# Patient Record
Sex: Female | Born: 1975 | Race: White | Hispanic: No | Marital: Married | Smoking: Never smoker
Health system: Southern US, Community
[De-identification: ages and names within clinical notes are randomized; demographics above are authoritative.]

## PROBLEM LIST (undated history)

## (undated) DIAGNOSIS — K760 Fatty (change of) liver, not elsewhere classified: Secondary | ICD-10-CM

## (undated) DIAGNOSIS — I10 Essential (primary) hypertension: Secondary | ICD-10-CM

## (undated) DIAGNOSIS — E119 Type 2 diabetes mellitus without complications: Secondary | ICD-10-CM

## (undated) DIAGNOSIS — Z9049 Acquired absence of other specified parts of digestive tract: Secondary | ICD-10-CM

## (undated) DIAGNOSIS — E282 Polycystic ovarian syndrome: Secondary | ICD-10-CM

## (undated) DIAGNOSIS — F319 Bipolar disorder, unspecified: Secondary | ICD-10-CM

## (undated) DIAGNOSIS — M069 Rheumatoid arthritis, unspecified: Secondary | ICD-10-CM

## (undated) DIAGNOSIS — E079 Disorder of thyroid, unspecified: Secondary | ICD-10-CM

## (undated) DIAGNOSIS — F419 Anxiety disorder, unspecified: Secondary | ICD-10-CM

## (undated) HISTORY — DX: Polycystic ovarian syndrome: E28.2

## (undated) HISTORY — PX: KNEE ARTHROPLASTY: SHX992

## (undated) HISTORY — DX: Rheumatoid arthritis, unspecified: M06.9

## (undated) HISTORY — DX: Essential (primary) hypertension: I10

## (undated) HISTORY — DX: Bipolar disorder, unspecified: F31.9

## (undated) HISTORY — DX: Anxiety disorder, unspecified: F41.9

## (undated) HISTORY — DX: Disorder of thyroid, unspecified: E07.9

## (undated) HISTORY — PX: KNEE ARTHROSCOPY: SUR90

## (undated) HISTORY — DX: Fatty (change of) liver, not elsewhere classified: K76.0

---

## 1997-01-24 HISTORY — PX: CHOLECYSTECTOMY: SHX55

## 2001-01-24 DIAGNOSIS — E039 Hypothyroidism, unspecified: Secondary | ICD-10-CM | POA: Insufficient documentation

## 2003-01-25 DIAGNOSIS — K219 Gastro-esophageal reflux disease without esophagitis: Secondary | ICD-10-CM | POA: Insufficient documentation

## 2003-01-25 DIAGNOSIS — F418 Other specified anxiety disorders: Secondary | ICD-10-CM | POA: Insufficient documentation

## 2003-01-25 DIAGNOSIS — E282 Polycystic ovarian syndrome: Secondary | ICD-10-CM | POA: Insufficient documentation

## 2003-02-03 ENCOUNTER — Ambulatory Visit (HOSPITAL_COMMUNITY): Admission: RE | Admit: 2003-02-03 | Discharge: 2003-02-03 | Payer: Self-pay | Admitting: Internal Medicine

## 2004-07-23 ENCOUNTER — Ambulatory Visit (HOSPITAL_COMMUNITY): Admission: RE | Admit: 2004-07-23 | Discharge: 2004-07-23 | Payer: Self-pay | Admitting: Internal Medicine

## 2004-10-12 ENCOUNTER — Encounter (HOSPITAL_COMMUNITY): Admission: RE | Admit: 2004-10-12 | Discharge: 2004-10-23 | Payer: Self-pay | Admitting: Internal Medicine

## 2004-10-26 ENCOUNTER — Encounter (HOSPITAL_COMMUNITY): Admission: RE | Admit: 2004-10-26 | Discharge: 2004-11-25 | Payer: Self-pay | Admitting: Internal Medicine

## 2005-12-02 ENCOUNTER — Ambulatory Visit (HOSPITAL_COMMUNITY): Admission: RE | Admit: 2005-12-02 | Discharge: 2005-12-02 | Payer: Self-pay | Admitting: Internal Medicine

## 2006-09-25 ENCOUNTER — Emergency Department (HOSPITAL_COMMUNITY): Admission: EM | Admit: 2006-09-25 | Discharge: 2006-09-25 | Payer: Self-pay | Admitting: Family Medicine

## 2006-10-24 ENCOUNTER — Ambulatory Visit (HOSPITAL_COMMUNITY): Admission: RE | Admit: 2006-10-24 | Discharge: 2006-10-24 | Payer: Self-pay | Admitting: Orthopedic Surgery

## 2006-11-23 ENCOUNTER — Encounter (INDEPENDENT_AMBULATORY_CARE_PROVIDER_SITE_OTHER): Payer: Self-pay | Admitting: Orthopedic Surgery

## 2006-11-23 ENCOUNTER — Ambulatory Visit (HOSPITAL_BASED_OUTPATIENT_CLINIC_OR_DEPARTMENT_OTHER): Admission: RE | Admit: 2006-11-23 | Discharge: 2006-11-23 | Payer: Self-pay | Admitting: Orthopedic Surgery

## 2007-03-30 ENCOUNTER — Ambulatory Visit (HOSPITAL_COMMUNITY): Admission: RE | Admit: 2007-03-30 | Discharge: 2007-03-30 | Payer: Self-pay | Admitting: Internal Medicine

## 2007-11-14 IMAGING — US US MISC SOFT TISSUE
2 series · 14 of 16 positions shown · non-contrast
Comparison: none

CLINICAL DATA: Volar mass.  Question ? cyst?
SOFT TISSUE ? RIGHT WRIST ULTRASOUND:
TECHNIQUE: Ultrasound examination of the soft tissues was performed in the area of clinical concern.

[Series 1: us misc soft tissue · 0.04mm/px · 11 of 14 slices shown (1 of 2)]
[im 1/14]
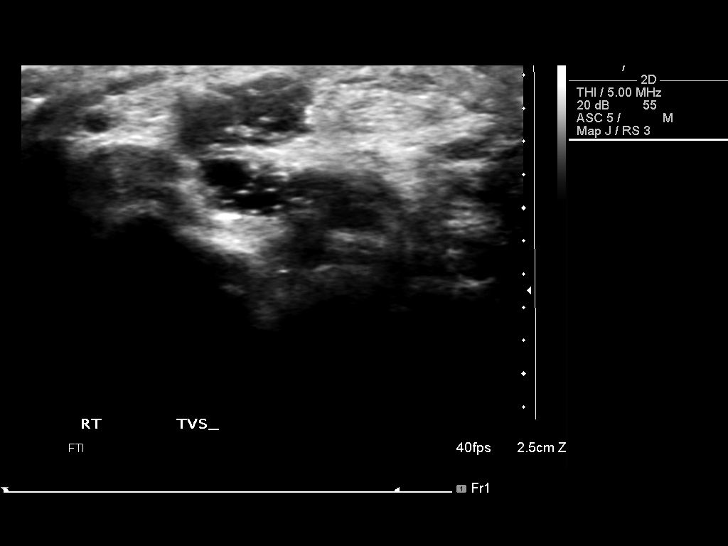
[im 2/14]
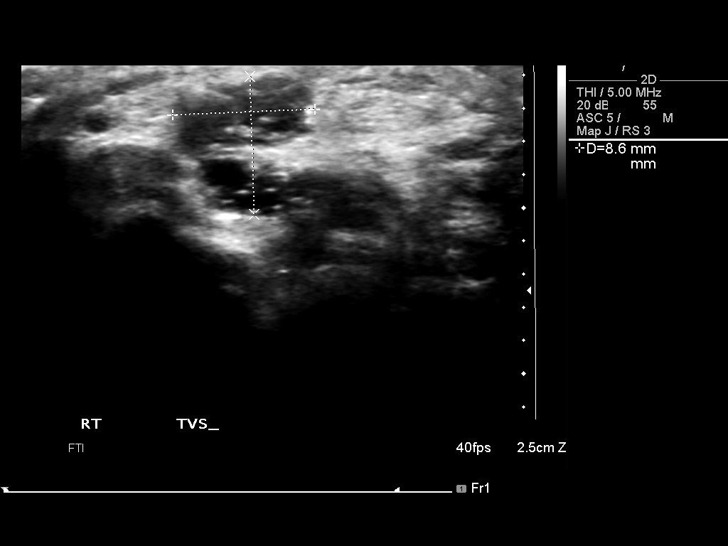
[im 3/14]
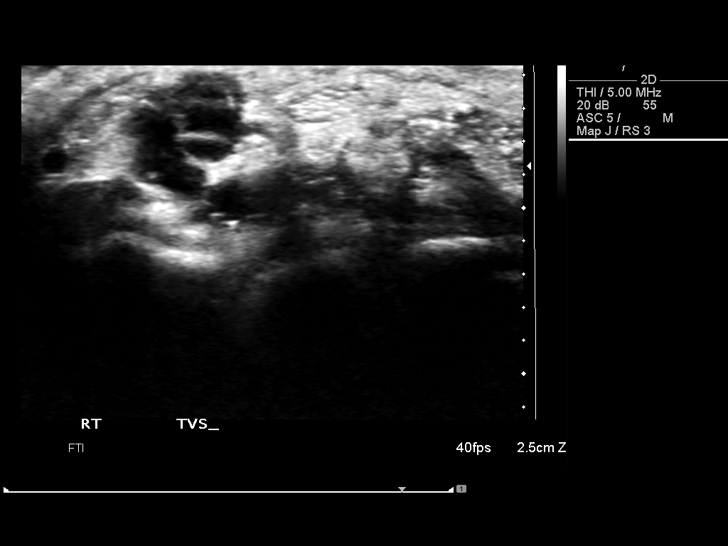
[im 5/14]
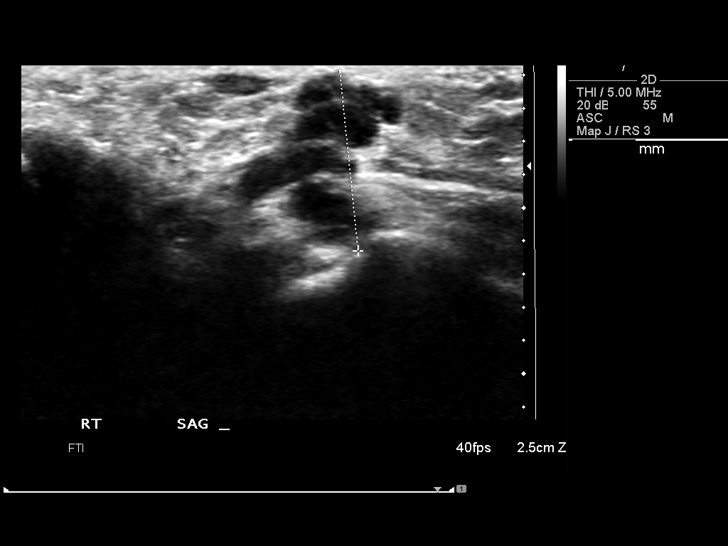
[im 6/14]
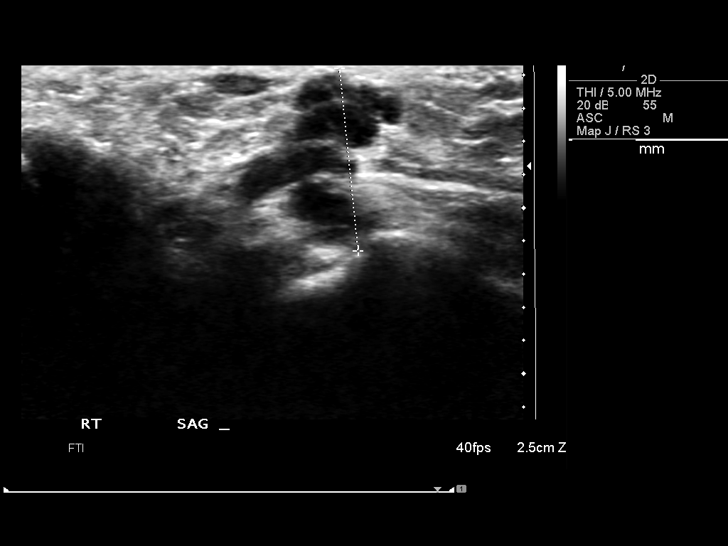
[im 7/14]
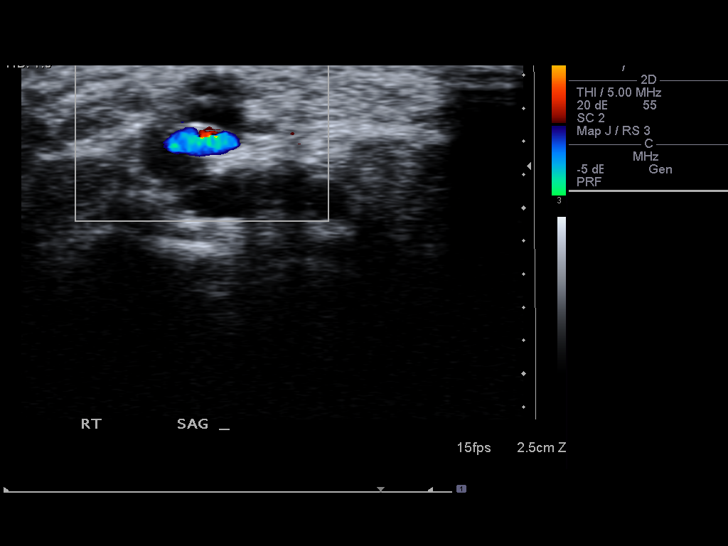
[im 8/14]
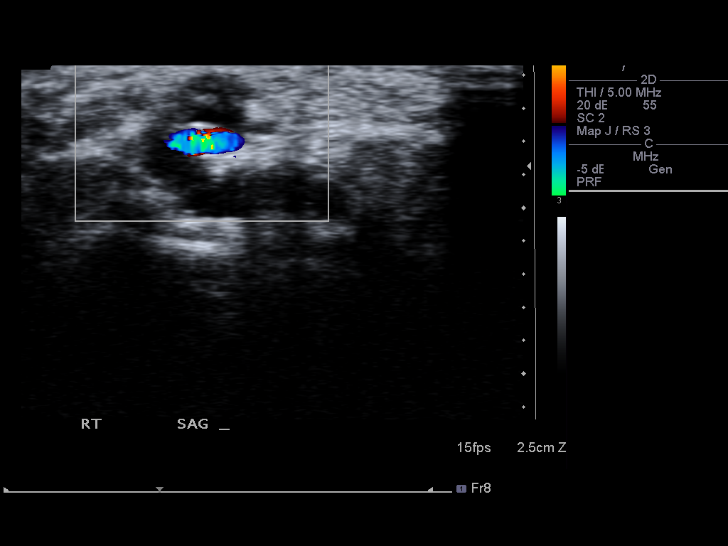
[im 9/14]
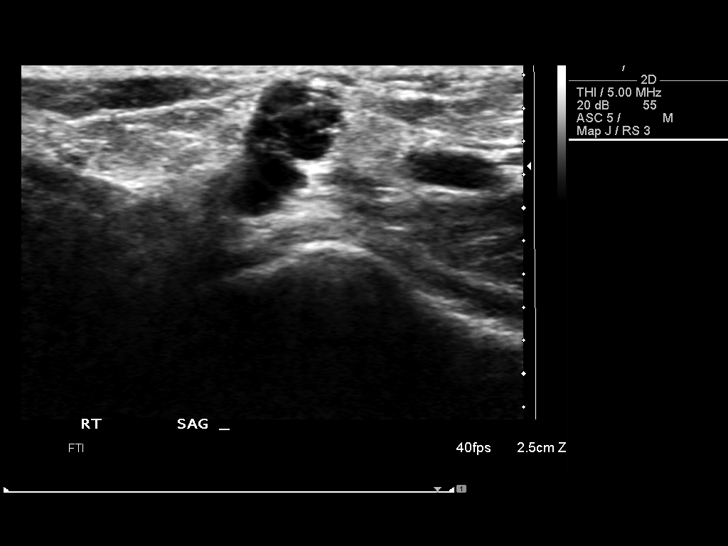
[im 10/14]
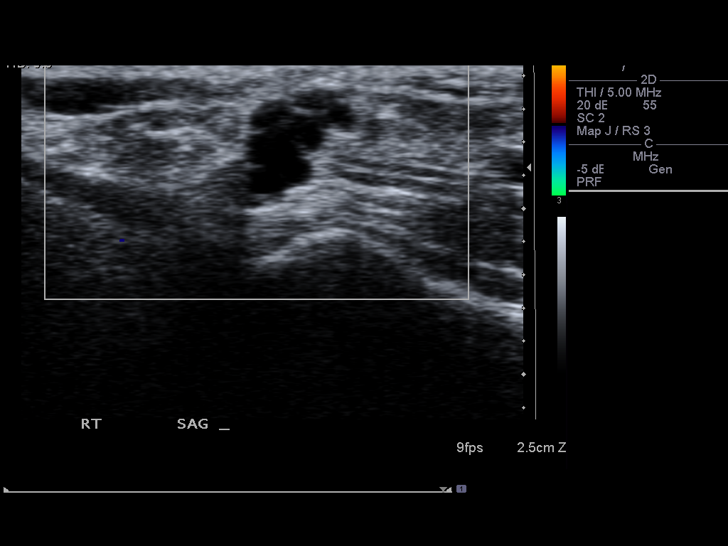
[im 11/14]
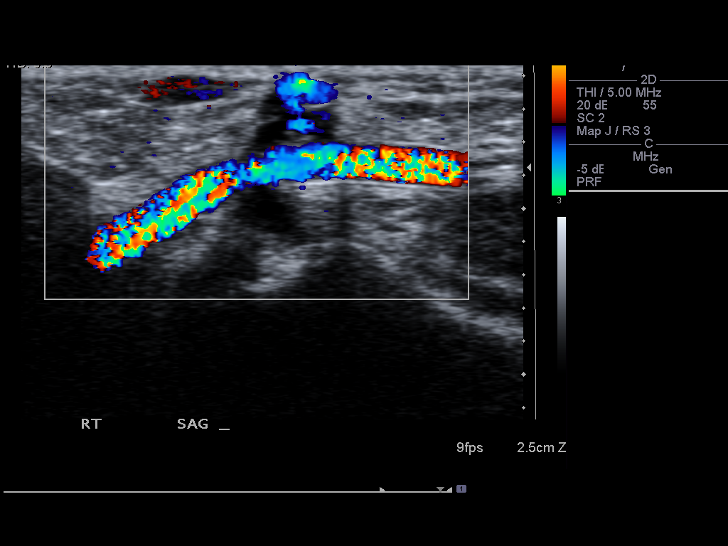
[im 14/14]
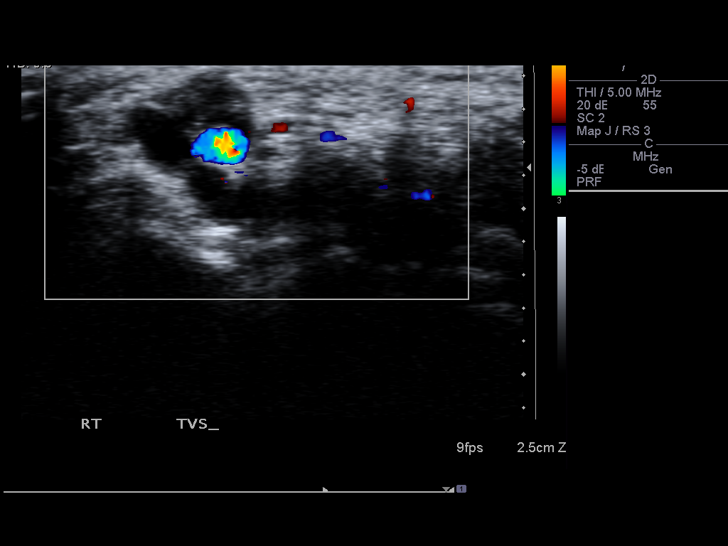

[Series 2: us misc soft tissue · 0.05mm/px · 3 of 3 slices shown (2 of 2)]
[im 1/3]
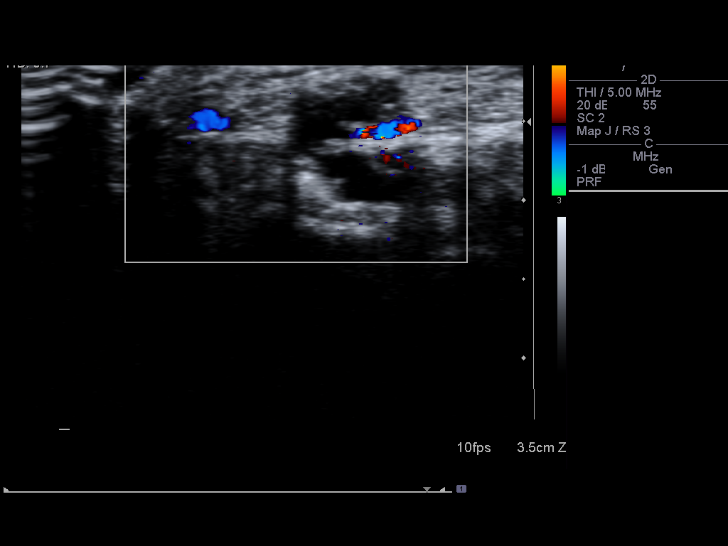
[im 2/3]
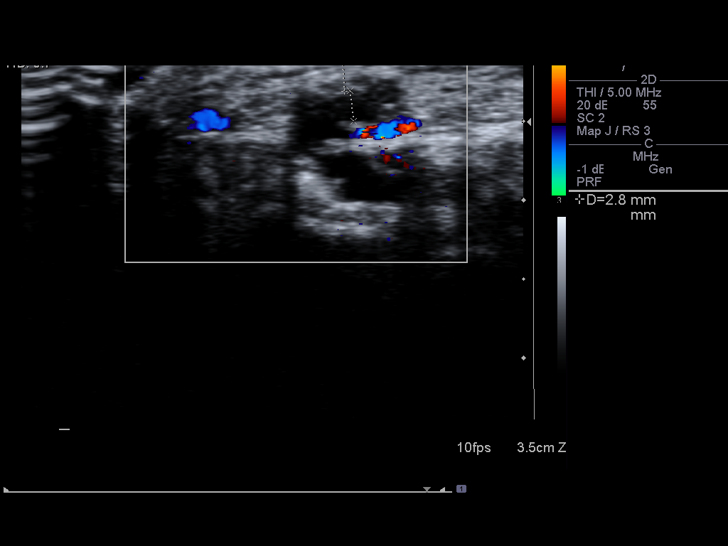
[im 3/3]
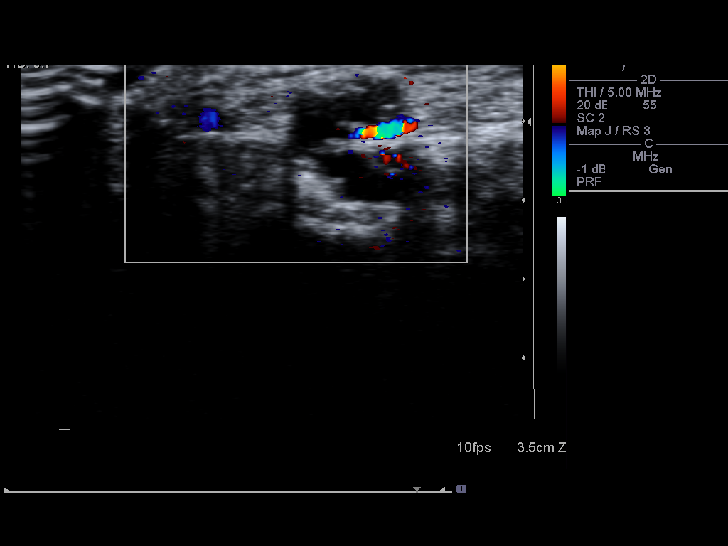

[14 of 16 positions shown; findings below may reference images not displayed]

FINDINGS: There is a hypoechoic collection with increased through transmission consistent with a ganglion cyst along the volar aspect o the wrist.  The collection has a C shape and surrounds the radial artery.  The collection measures approximately 2 mm in diameter above the radial artery and is 3mm below the skin surface.  There are some internal echoes within this collection likely reflecting the presence of some debris.  A few septations are also noted.
IMPRESSION: Ganglion cyst along the distal aspect of the wrist, as detailed above.

## 2008-01-25 HISTORY — PX: ESOPHAGOGASTRODUODENOSCOPY: SHX1529

## 2008-01-25 HISTORY — PX: COLONOSCOPY W/ BIOPSIES: SHX1374

## 2008-02-29 ENCOUNTER — Ambulatory Visit (HOSPITAL_COMMUNITY): Admission: RE | Admit: 2008-02-29 | Discharge: 2008-02-29 | Payer: Self-pay | Admitting: Internal Medicine

## 2008-03-11 ENCOUNTER — Ambulatory Visit (HOSPITAL_COMMUNITY): Admission: RE | Admit: 2008-03-11 | Discharge: 2008-03-11 | Payer: Self-pay | Admitting: Internal Medicine

## 2008-03-26 ENCOUNTER — Ambulatory Visit (HOSPITAL_COMMUNITY): Admission: RE | Admit: 2008-03-26 | Discharge: 2008-03-26 | Payer: Self-pay | Admitting: Obstetrics and Gynecology

## 2008-08-07 ENCOUNTER — Ambulatory Visit (HOSPITAL_BASED_OUTPATIENT_CLINIC_OR_DEPARTMENT_OTHER): Admission: RE | Admit: 2008-08-07 | Discharge: 2008-08-07 | Payer: Self-pay | Admitting: Orthopaedic Surgery

## 2008-08-07 ENCOUNTER — Encounter (INDEPENDENT_AMBULATORY_CARE_PROVIDER_SITE_OTHER): Payer: Self-pay | Admitting: Orthopaedic Surgery

## 2008-09-22 ENCOUNTER — Encounter (HOSPITAL_COMMUNITY): Admission: RE | Admit: 2008-09-22 | Discharge: 2008-10-22 | Payer: Self-pay | Admitting: Orthopaedic Surgery

## 2008-10-31 ENCOUNTER — Encounter (INDEPENDENT_AMBULATORY_CARE_PROVIDER_SITE_OTHER): Payer: Self-pay | Admitting: Gastroenterology

## 2008-10-31 ENCOUNTER — Ambulatory Visit (HOSPITAL_COMMUNITY): Admission: RE | Admit: 2008-10-31 | Discharge: 2008-10-31 | Payer: Self-pay | Admitting: Gastroenterology

## 2009-01-09 ENCOUNTER — Ambulatory Visit (HOSPITAL_COMMUNITY): Admission: RE | Admit: 2009-01-09 | Discharge: 2009-01-09 | Payer: Self-pay | Admitting: Rheumatology

## 2009-01-15 ENCOUNTER — Ambulatory Visit (HOSPITAL_COMMUNITY): Admission: RE | Admit: 2009-01-15 | Discharge: 2009-01-15 | Payer: Self-pay | Admitting: Gastroenterology

## 2009-01-18 ENCOUNTER — Emergency Department (HOSPITAL_COMMUNITY): Admission: EM | Admit: 2009-01-18 | Discharge: 2009-01-18 | Payer: Self-pay | Admitting: Emergency Medicine

## 2009-03-18 ENCOUNTER — Emergency Department (HOSPITAL_COMMUNITY): Admission: EM | Admit: 2009-03-18 | Discharge: 2009-03-18 | Payer: Self-pay | Admitting: Emergency Medicine

## 2009-03-21 IMAGING — CR DG KNEE COMPLETE 4+V*R*
4 series · 4 of 4 positions shown · non-contrast
Comparison: None

CLINICAL DATA: Knee pain, no recent injury

RIGHT KNEE - COMPLETE 4+ VIEW

[view not recorded (1 of 4)]
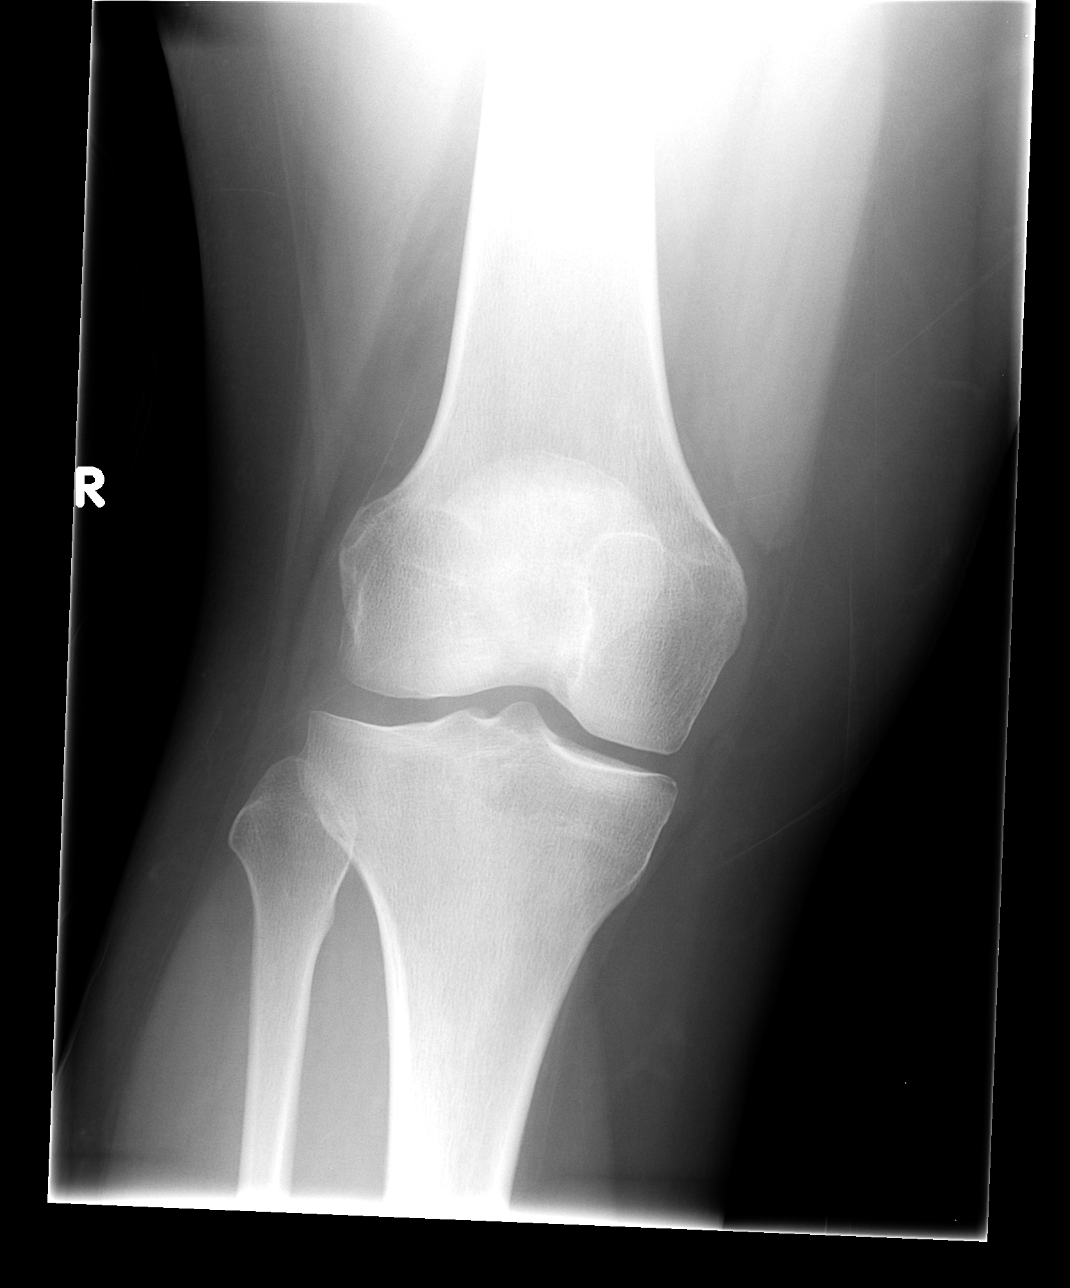

[view not recorded (2 of 4)]
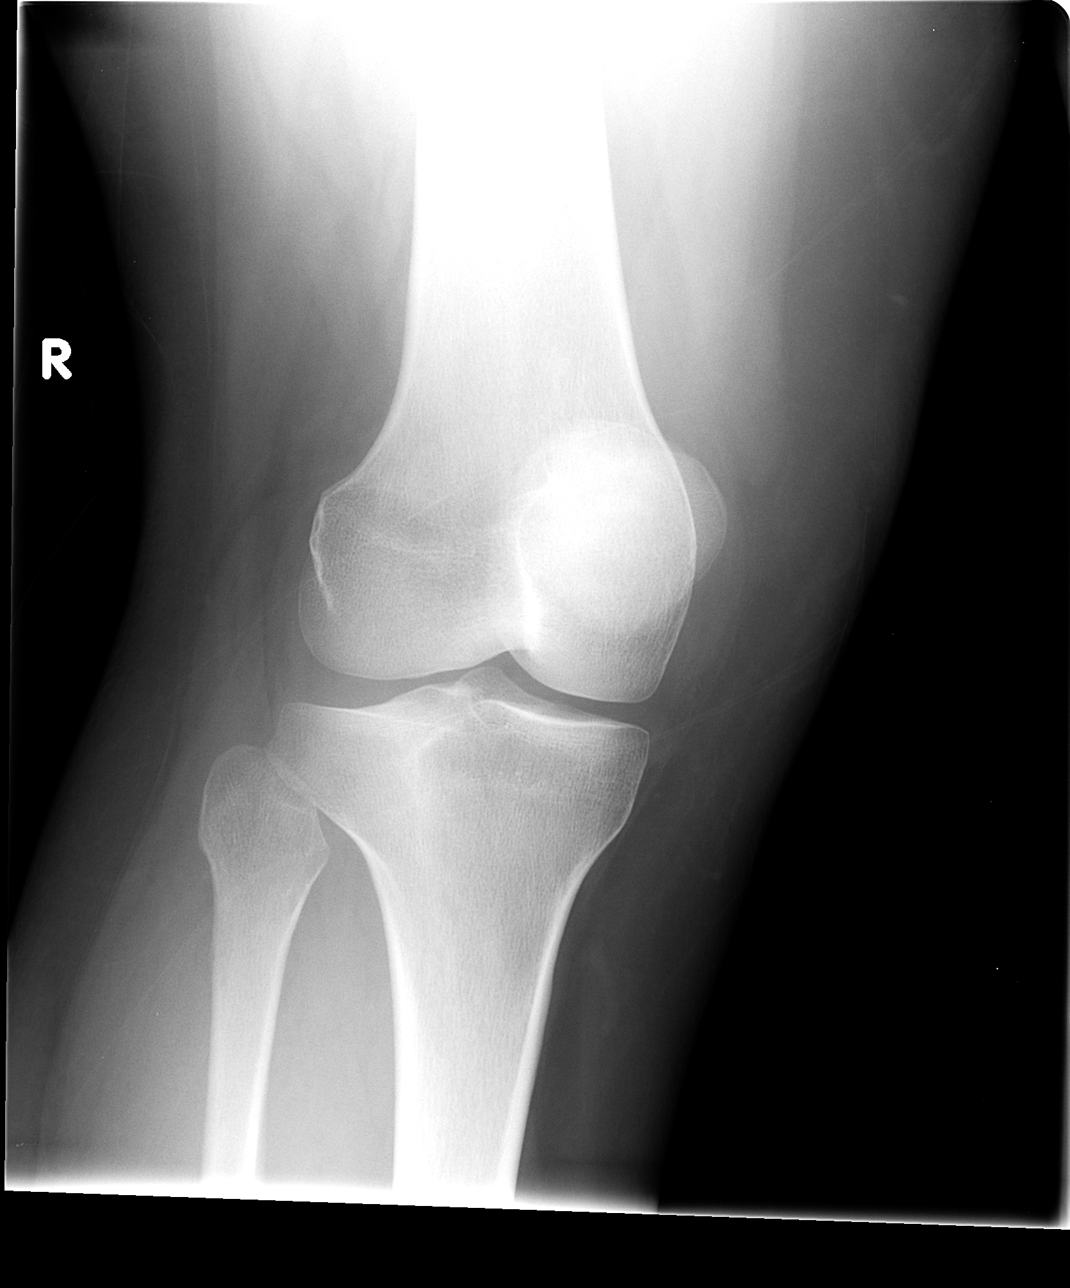

[view not recorded (3 of 4)]
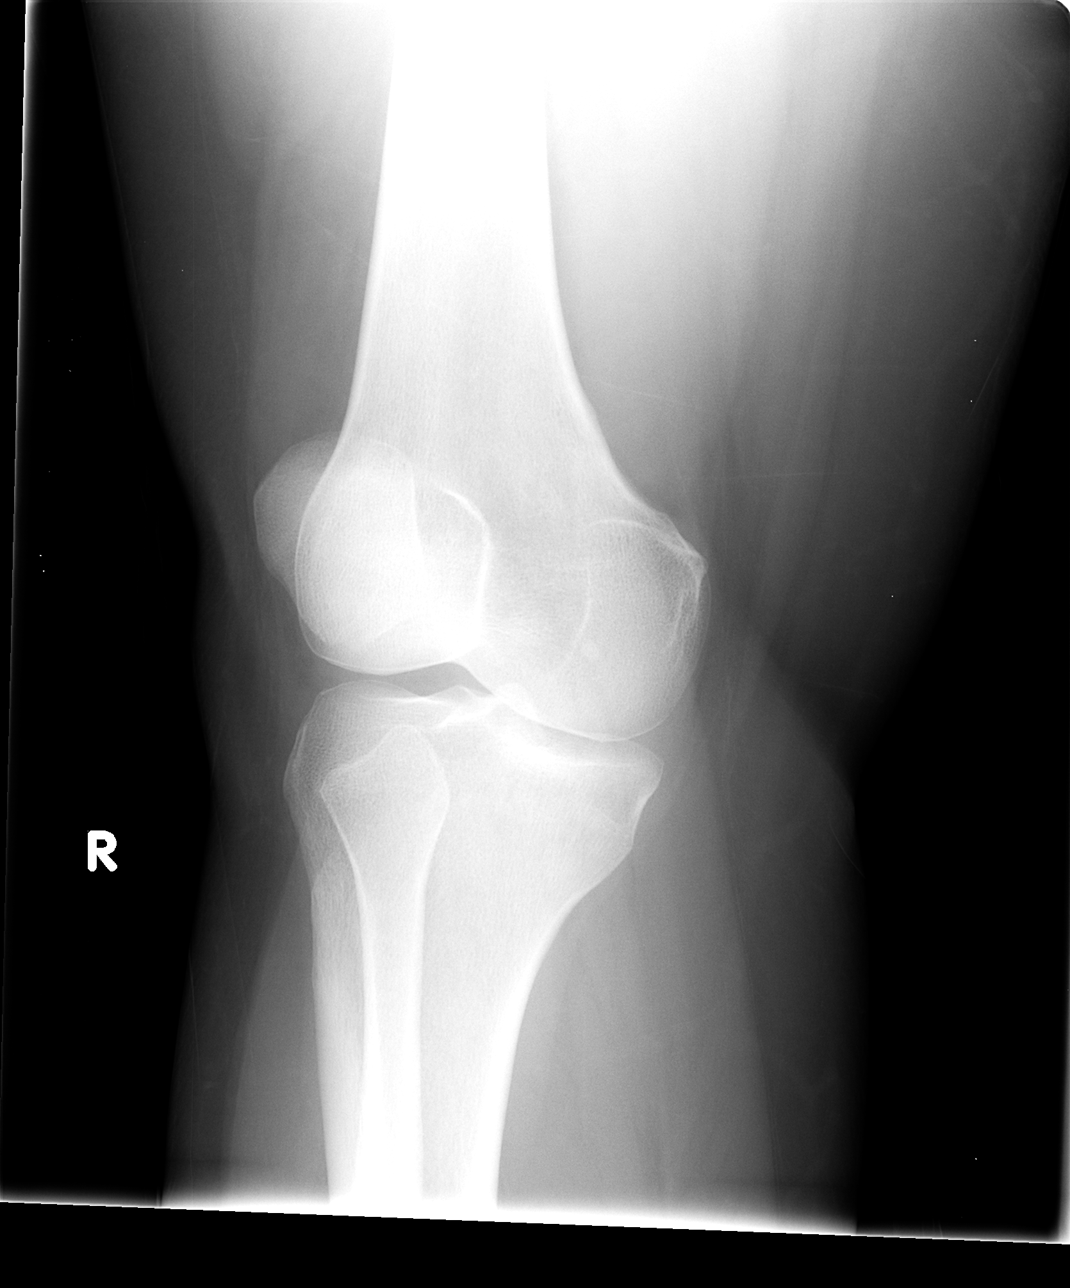

[view not recorded (4 of 4)]
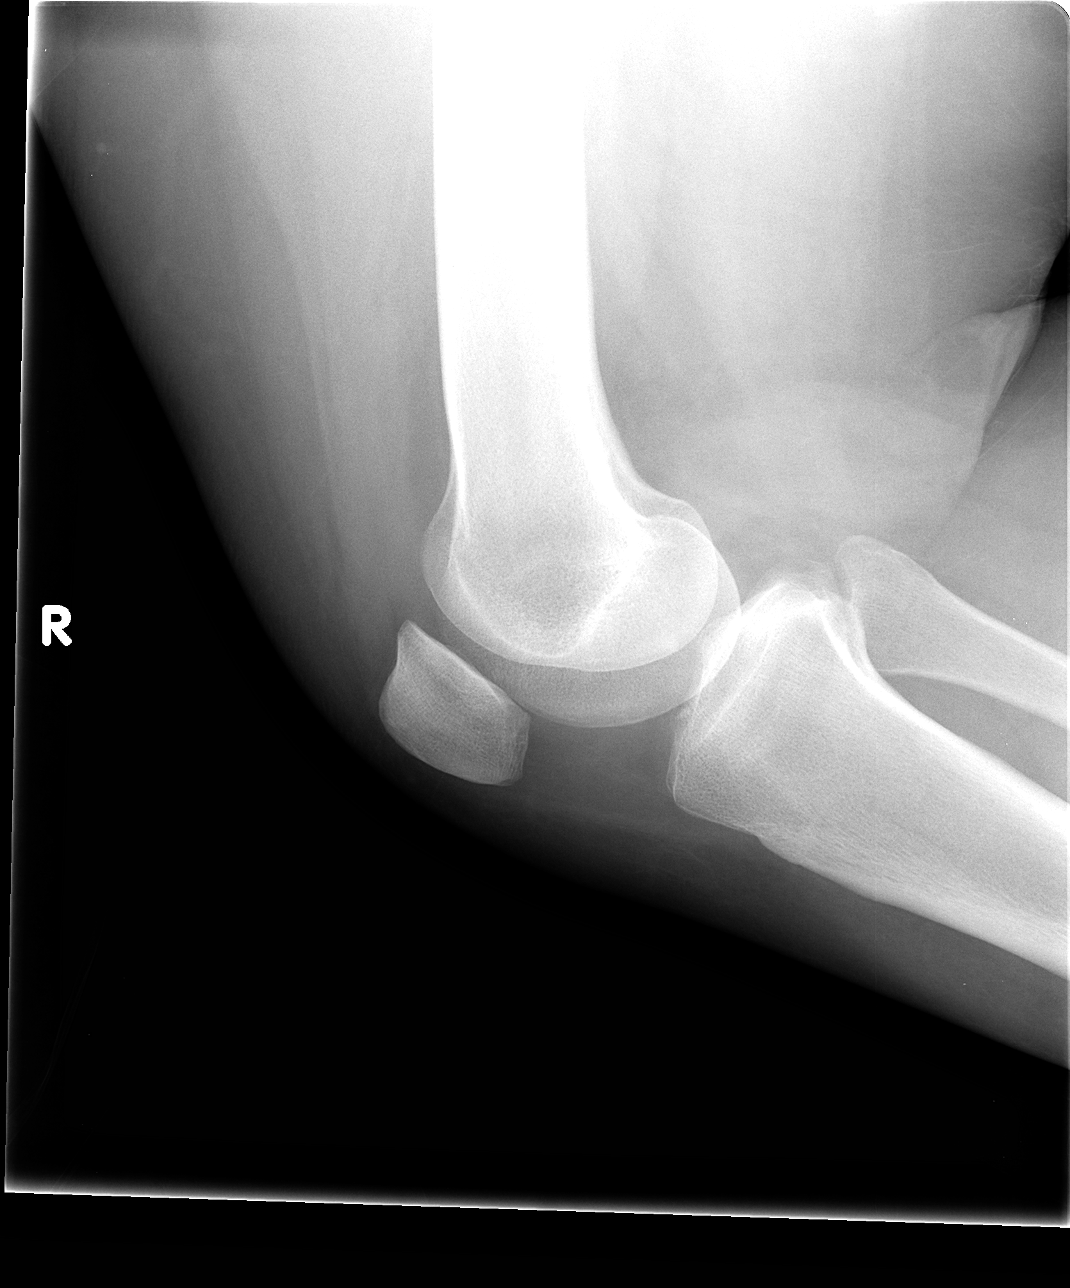

[4 of 4 positions shown; findings below may reference images not displayed]

FINDINGS: Joint spaces are normal, with only minimally decreased
medial joint space.  No fracture is seen and no effusion is noted.
IMPRESSION: No acute abnormality.  Only slight loss of medial joint space is
noted.

## 2009-05-18 ENCOUNTER — Emergency Department (HOSPITAL_COMMUNITY): Admission: EM | Admit: 2009-05-18 | Discharge: 2009-05-18 | Payer: Self-pay | Admitting: Family Medicine

## 2009-10-24 DIAGNOSIS — M47819 Spondylosis without myelopathy or radiculopathy, site unspecified: Secondary | ICD-10-CM | POA: Insufficient documentation

## 2009-11-20 ENCOUNTER — Ambulatory Visit (HOSPITAL_COMMUNITY): Admission: RE | Admit: 2009-11-20 | Discharge: 2009-11-20 | Payer: Self-pay | Admitting: Psychiatry

## 2010-02-15 ENCOUNTER — Emergency Department (HOSPITAL_COMMUNITY)
Admission: EM | Admit: 2010-02-15 | Discharge: 2010-02-15 | Disposition: A | Discharge status: Other Acute Inpatient Hospital | Payer: Self-pay | Source: Home / Self Care | Admitting: Emergency Medicine

## 2010-02-15 ENCOUNTER — Inpatient Hospital Stay (HOSPITAL_COMMUNITY)
Admission: EM | Admit: 2010-02-15 | Discharge: 2010-02-19 | Payer: Self-pay | Source: Home / Self Care | Attending: Psychiatry | Admitting: Psychiatry

## 2010-02-16 LAB — URINALYSIS, ROUTINE W REFLEX MICROSCOPIC
Bilirubin Urine: NEGATIVE
Hgb urine dipstick: NEGATIVE
Ketones, ur: NEGATIVE mg/dL
Protein, ur: NEGATIVE mg/dL
Urobilinogen, UA: 0.2 mg/dL (ref 0.0–1.0)

## 2010-02-16 LAB — CBC
HCT: 40.4 % (ref 36.0–46.0)
MCH: 30 pg (ref 26.0–34.0)
MCHC: 32.9 g/dL (ref 30.0–36.0)
MCV: 91 fL (ref 78.0–100.0)
RDW: 13.3 % (ref 11.5–15.5)

## 2010-02-16 LAB — BASIC METABOLIC PANEL
BUN: 5 mg/dL — ABNORMAL LOW (ref 6–23)
CO2: 25 mEq/L (ref 19–32)
Chloride: 104 mEq/L (ref 96–112)
Creatinine, Ser: 0.67 mg/dL (ref 0.4–1.2)
GFR calc Af Amer: >60 mL/min (ref >60)

## 2010-02-16 LAB — RAPID URINE DRUG SCREEN, HOSP PERFORMED
Amphetamines: NOT DETECTED
Opiates: NOT DETECTED
Tetrahydrocannabinol: NOT DETECTED

## 2010-02-16 LAB — DIFFERENTIAL
Eosinophils Relative: 3 % (ref 0–5)
Lymphocytes Relative: 30 % (ref 12–46)
Lymphs Abs: 4.2 10*3/uL — ABNORMAL HIGH (ref 0.7–4.0)
Monocytes Absolute: 0.8 10*3/uL (ref 0.1–1.0)
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
## 2010-02-23 NOTE — Discharge Summary (Signed)
NAME:  Allison Atkinson NO.:  [id number]  MEDICAL RECORD NO.:  [id number]          PATIENT TYPE:  IPS  LOCATION:  0502                          FACILITY:  [facility]  PHYSICIAN:  Marlis Edelson, DO        DATE OF BIRTH:  Nov 27, 1975  DATE OF ADMISSION:  02/15/2010 DATE OF DISCHARGE:  02/19/2010                              DISCHARGE SUMMARY   CHIEF COMPLAINT:  Depression.  HISTORY OF THE CHIEF COMPLAINT:  Allison Atkinson is a 35 year old Caucasian female admitted to the [facility] Unit on February 15, 2010, for the treatment of major depressive disorder.  She has suffered depressive symptoms since the onset at age 67.  She was formally diagnosed and began treatment at the age of 40.  She states that over the last 2 years things have built up and have made her depression worse.  It has certainly been worse over the last 2 weeks which she described as very bad.  The precipitance to her current depressive episodes include financial stressors, a great deal of worry, job stressors due to missed work due to recurrent headaches, and a separation from her husband of 9 years last month because of marital discord.  Ms. Oatis has suffered decreased motivation, decreased interest, decreased sleep, guilt feelings over things that she cannot control, diminished energy, poor concentration, helplessness, hopelessness, worthlessness, and decreased psychomotor activity.  She has suffered from anhedonia.  She states that her planning for activities is okay but she has no follow through.  Over the 2 to 3 weeks preceding admission, she suffered paranoid feelings.  She states that she felt suicidal on Sunday but did not feel homicidal.  She was not suicidal at the time of admission but had nihilistic thinking.  Previous treatments had included the use of Abilify to augment her current prescription for Lexapro.  The Abilify, however, caused akathisia.  Wellbutrin was added at one  point but was ineffective.  She has been on and off Lexapro for a number of years.  She has never been on other SSRIs, has never been on Effexor, and had never been on Cymbalta.  She has used Klonopin in the past for anxiety.  PAST PSYCHIATRIC HISTORY:  Positive for major depressive disorder, chronic, recurrent.  No previous psychiatric admissions, no history of suicidal attempts, and no history of self-mutilation.  PSYCHOTROPIC MEDICATIONS AT ADMISSION: 1. Lexapro 20 mg p.o. daily. 2. She was taking metformin for polycystic ovarian disease. 3. Nexium for gastroesophageal reflux disease. 4. Synthroid for hypothyroidism. 5. Humira injection for rheumatoid arthritis.  HOSPITAL COURSE:  Ms. Pulaski was placed on the adult unit where she was integrated into the adult milieu with attendance in groups and unit activities.  She was seen by a psychiatric provider on a daily basis. She did find out during the course of her hospitalization that she lost her job due to her recurrent headaches and frequent absences.  She was started on Cymbalta with the Lexapro being discontinued.  She attended groups.  Her insight was present.  Psychoeducation was provided throughout the course of her hospitalization and at no time did she  suffer from suicidal or homicidal ideation or psychotic symptoms.  She had no behavioral discord while on the unit.  She tolerated medications well.  She was started on Cymbalta 30 mg 1st dose on the 25th that was rapidly increased to 60 mg daily following the discontinuation of the Lexapro.  She had no problems crossing from the Lexapro to the Cymbalta. She was tried on Rozerem for sleep which was not beneficial.  She was given Vistaril 100 mg q.h.s. that appeared to help.  On February 19, 2010, she related her mood as improved.  She stated that she felt good that day.  She denied any intolerance to medications and was doing well on her current medication regimen.  She again  had no suicidal or homicidal ideation.  She was more hopeful.  She had no psychosis and there was never evidence of hypomania or mania.  She was therefore discharged home in stable improved condition.  LABORATORY AND IMAGING:  None.  CONSULTATIONS:  None.  COMPLICATIONS:  None.  PROCEDURES:  None.  MENTAL STATUS EXAMINATION:  At time of discharge, she was casually dressed.  Eye contact was good.  Motor and behavior good.  Speech normal.  Level of consciousness alert.  Mood euthymic.  Affect appropriate.  Anxiety level, none.  Thought process linear, logical, and goal directed.  Thought content without perceptual symptoms.  Judgment intact.  Insight intact.  Cognitively she was intact.  DISCHARGE DIAGNOSES:  AXIS I:  Major depressive disorder, chronic, recurrent, severe without psychotic features. AXIS II:  Deferred. AXIS III: 1. Hypothyroidism. 2. Rheumatoid arthritis. 3. Migraine cephalgia. 4. Polycystic ovarian disease. 5. Cholecystectomy secondary to cholelithiasis. 6. Gastroesophageal reflux disease. 7. Gastric polyps. 8. Colonic polyps. AXIS III: 1. Loss of job. 2. Marital stressors. 3. Financial stressors. AXIS V:  52.  DISCHARGE INSTRUCTIONS:  Return to the hospital immediately for any development of suicidal or homicidal ideation, marked change in mood or affect.  She is also to seek emergent treatment for any adverse reactions to medications.  [facility] 8[facility]45 a.m.  DISCHARGE MEDICATIONS: 1. Cymbalta 60 mg daily. 2. Vistaril 100 mg q.h.s. 3. Normodyne 100 mg daily. 4. Baclofen 10 mg 3 times a day as needed. 5. Imitrex injection daily as needed for migraines. 6. Metformin 500 mg twice daily for polycystic ovarian disease. 7. Nexium 40 mg q.h.s. for gastroesophageal reflux disease. 8. Synthroid 175 mcg daily for hypothyroidism. 9. Vitamin D OTC one daily for vitamin D  supplementation.  Stop the following medications: 1. Lexapro. 2. Ambien. 3. Klonopin.  PROGNOSIS:  Fair with appropriate psychiatric followup and compliance.          ______________________________ Marlis Edelson, DO     DB/MEDQ  D:  02/22/2010  T:  02/22/2010  Job:  [id number]  Electronically Signed by Marlis Edelson MD on 02/23/2010 07:53:28 PM

## 2010-03-01 ENCOUNTER — Other Ambulatory Visit (HOSPITAL_COMMUNITY): Payer: Commercial Managed Care - PPO | Attending: Psychiatry | Admitting: Psychiatry

## 2010-03-01 DIAGNOSIS — E039 Hypothyroidism, unspecified: Secondary | ICD-10-CM | POA: Insufficient documentation

## 2010-03-01 DIAGNOSIS — E282 Polycystic ovarian syndrome: Secondary | ICD-10-CM | POA: Insufficient documentation

## 2010-03-01 DIAGNOSIS — G43909 Migraine, unspecified, not intractable, without status migrainosus: Secondary | ICD-10-CM | POA: Insufficient documentation

## 2010-03-01 DIAGNOSIS — Z560 Unemployment, unspecified: Secondary | ICD-10-CM | POA: Insufficient documentation

## 2010-03-01 DIAGNOSIS — M069 Rheumatoid arthritis, unspecified: Secondary | ICD-10-CM | POA: Insufficient documentation

## 2010-03-01 DIAGNOSIS — F339 Major depressive disorder, recurrent, unspecified: Secondary | ICD-10-CM | POA: Insufficient documentation

## 2010-03-01 DIAGNOSIS — K219 Gastro-esophageal reflux disease without esophagitis: Secondary | ICD-10-CM | POA: Insufficient documentation

## 2010-03-02 ENCOUNTER — Other Ambulatory Visit (HOSPITAL_COMMUNITY): Payer: Commercial Managed Care - PPO | Admitting: Psychiatry

## 2010-03-03 ENCOUNTER — Other Ambulatory Visit (HOSPITAL_COMMUNITY): Payer: Commercial Managed Care - PPO | Admitting: Psychiatry

## 2010-03-03 DIAGNOSIS — F331 Major depressive disorder, recurrent, moderate: Secondary | ICD-10-CM

## 2010-03-04 ENCOUNTER — Other Ambulatory Visit (HOSPITAL_COMMUNITY): Payer: Commercial Managed Care - PPO | Admitting: Psychiatry

## 2010-03-05 ENCOUNTER — Other Ambulatory Visit (HOSPITAL_COMMUNITY): Payer: Commercial Managed Care - PPO | Admitting: Psychiatry

## 2010-03-08 ENCOUNTER — Other Ambulatory Visit (HOSPITAL_COMMUNITY): Payer: Commercial Managed Care - PPO | Admitting: Psychiatry

## 2010-03-09 ENCOUNTER — Other Ambulatory Visit (HOSPITAL_COMMUNITY): Payer: Commercial Managed Care - PPO | Admitting: Psychiatry

## 2010-03-10 ENCOUNTER — Other Ambulatory Visit (HOSPITAL_COMMUNITY): Payer: Commercial Managed Care - PPO | Admitting: Psychiatry

## 2010-03-11 ENCOUNTER — Other Ambulatory Visit (HOSPITAL_COMMUNITY): Payer: Commercial Managed Care - PPO | Admitting: Psychiatry

## 2010-03-12 ENCOUNTER — Other Ambulatory Visit (HOSPITAL_COMMUNITY): Payer: Commercial Managed Care - PPO | Admitting: Psychiatry

## 2010-03-15 ENCOUNTER — Other Ambulatory Visit (HOSPITAL_COMMUNITY): Payer: Commercial Managed Care - PPO | Admitting: Psychiatry

## 2010-03-16 ENCOUNTER — Other Ambulatory Visit (HOSPITAL_COMMUNITY): Payer: Commercial Managed Care - PPO | Admitting: Psychiatry

## 2010-03-17 ENCOUNTER — Other Ambulatory Visit (HOSPITAL_COMMUNITY): Payer: Commercial Managed Care - PPO | Admitting: Psychiatry

## 2010-03-18 ENCOUNTER — Other Ambulatory Visit (HOSPITAL_COMMUNITY): Payer: Commercial Managed Care - PPO | Admitting: Psychiatry

## 2010-03-19 ENCOUNTER — Other Ambulatory Visit (HOSPITAL_COMMUNITY): Payer: Commercial Managed Care - PPO | Admitting: Psychiatry

## 2010-04-14 LAB — DIFFERENTIAL
Basophils Relative: 1 % (ref 0–1)
Eosinophils Absolute: 0.2 10*3/uL (ref 0.0–0.7)
Eosinophils Relative: 2 % (ref 0–5)
Monocytes Absolute: 0.6 10*3/uL (ref 0.1–1.0)
Monocytes Relative: 6 % (ref 3–12)

## 2010-04-14 LAB — COMPREHENSIVE METABOLIC PANEL
ALT: 59 U/L — ABNORMAL HIGH (ref 0–35)
AST: 31 U/L (ref 0–37)
Albumin: 4 g/dL (ref 3.5–5.2)
Alkaline Phosphatase: 57 U/L (ref 39–117)
Glucose, Bld: 88 mg/dL (ref 70–99)
Potassium: 3.9 mEq/L (ref 3.5–5.1)
Sodium: 138 mEq/L (ref 135–145)
Total Protein: 7 g/dL (ref 6.0–8.3)

## 2010-04-14 LAB — CBC
Hemoglobin: 12.9 g/dL (ref 12.0–15.0)
RDW: 13.8 % (ref 11.5–15.5)

## 2010-05-02 LAB — POCT HEMOGLOBIN-HEMACUE: Hemoglobin: 12.9 g/dL (ref 12.0–15.0)

## 2010-06-02 ENCOUNTER — Inpatient Hospital Stay (INDEPENDENT_AMBULATORY_CARE_PROVIDER_SITE_OTHER)
Admission: RE | Admit: 2010-06-02 | Discharge: 2010-06-02 | Disposition: A | Discharge status: Home / Self Care | Payer: Commercial Managed Care - PPO | Source: Ambulatory Visit | Attending: Family Medicine | Admitting: Family Medicine

## 2010-06-02 DIAGNOSIS — J029 Acute pharyngitis, unspecified: Secondary | ICD-10-CM

## 2010-06-08 NOTE — Op Note (Signed)
NAME:  HAYLYNN, PHA              ACCOUNT NO.:  [id number]   MEDICAL RECORD NO.:  [id number]          PATIENT TYPE:  AMB   LOCATION:  DSC                          FACILITY:  [facility]   PHYSICIAN:  Claude Manges. Whitfield, M.D.DATE OF BIRTH:  [birth year]   DATE OF PROCEDURE:  08/07/2008  DATE OF DISCHARGE:                               OPERATIVE REPORT   PREOPERATIVE DIAGNOSIS:  Recurrent effusion, right knee with  questionable tear of medial meniscus.   POSTOPERATIVE DIAGNOSIS:  Recurrent effusion, right knee with diffuse  synovitis.   PROCEDURE:  1. Diagnostic arthroscopy, right knee.  2. Synovectomy with biopsy.   SURGEON:  Claude Manges. Cleophas Dunker, MD   ANESTHESIA:  General.   COMPLICATIONS:  None.   HISTORY:  A 35 year old female who has had recurrent pain and effusions  of her right knee over the past year.  Primary care doctor has aspirated  knee on 2 occasions, but without any definitive diagnosis.  She has had  an MRI scan that revealed effusion without evidence of intra-articular  loose body or other intra-articular pathology, but she continues to have  pain to the point of compromise, now that she was having arthroscopic  evaluation.  She has been on Mobic and Arthrotec and does have diffuse  posteromedial joint pain.   PROCEDURE:  With the patient comfortable on the operating table and  under general mask anesthesia, the right lower extremity was placed in a  thigh holder.  Leg was then prepped with DuraPrep from the thigh holder  to the ankle.  Sterile draping was performed.   Diagnostic arthroscopy was performed using a medial and lateral  parapatellar tendon puncture site.  I did infiltrate those areas with  Xylocaine with epinephrine preoperatively.  There was a very small clear  yellow joint effusion.   Diagnostic arthroscopy revealed diffuse beefy red synovitis in the  superior pouch.  There was very minimal chondromalacia patella.  No  corresponding chondromalacia  of the trochlea.  I did not see any loose  bodies.  There was a large thickened plica medially.  Using the  articular wand, I debrided the thickened plica, biopsy of the synovium,  and performed a synovectomy.  Both gutters were full of the beefy red  synovitis.   The medial lateral compartments were devoid of meniscus pathology or  obvious chondromalacia.  There were no erosive changes.  The ACL was  intact, but there was diffuse synovitis in both compartments and the  intercondylar notch.  I took several synovial biopsies and will send  them for cytology.  Diffuse synovectomy was performed in all 3  compartments.   I further infiltrated the puncture sites with Marcaine with epinephrine.  Sterile bulky dressing was applied followed by an Ace bandage.   PLAN:  Hydrocodone for pain.  Office in 1 week.      Claude Manges. Cleophas Dunker, M.D.  Electronically Signed     PWW/MEDQ  D:  08/07/2008  T:  08/07/2008  Job:  [id number]

## 2010-06-08 NOTE — Op Note (Signed)
NAME:  MISTIE, ADNEY              ACCOUNT NO.:  [id number]   MEDICAL RECORD NO.:  [id number]          PATIENT TYPE:  AMB   LOCATION:  DSC                          FACILITY:  [facility]   PHYSICIAN:  Cindee Salt, M.D.       DATE OF BIRTH:  Mar 03, 1975   DATE OF PROCEDURE:  11/23/2006  DATE OF DISCHARGE:                               OPERATIVE REPORT   PREOPERATIVE DIAGNOSIS:  Volar radial wrist ganglion.   POSTOPERATIVE DIAGNOSIS:  Volar radial wrist ganglion.   OPERATION:  Excision volar radial wrist ganglion, right wrist.   SURGEON:  Cindee Salt, M.D.   ASSISTANT:  Carolyne Fiscal R.N.   ANESTHESIA:  General.   HISTORY:  The patient is a 35 year old female with a history of a volar  radial wrist ganglion.  She is desirous of having this excised.  She is  aware of risks and complications including recurrence, infection, injury  to arteries, nerves, tendons, incomplete relief of symptoms, dystrophy.  She has elected to undergo the procedure, questions were encouraged and  answered. In the preoperative area, the patient is seen with her  husband, questions again encouraged and answered.  The extremity was  marked by both the patient and surgeon.   PROCEDURE:  The patient is brought to the operating room where a general  anesthetic was carried out without difficulty.  She was prepped using  DuraPrep, supine position, right arm free.  The limb was exsanguinated  with an Esmarch bandage and a tourniquet placed high on the arm was  inflated to 250 mmHg.  A volar incision was made on the radial aspect of  the wrist, carried down through subcutaneous tissue.  Bleeders were  electrocauterized.  The dissection was carried down on the radial aspect  of the flexor carpi radialis tendon.  The fascia was opened, the radial  artery was identified.  A cystic structure was immediately apparent.  This had intertwined between the artery and venae comitans.  With  careful blunt and sharp dissection, this was  dissected free and followed  down to the radiocarpal joint.  This area was opened and minimally  debrided.  The specimen was sent to pathology.  The wound was irrigated.  The subcutaneous tissue was closed with interrupted 4-0 Vicryl Rapide  sutures.  The skin was closed with interrupted 4-0 Vicryl Rapide.  A  sterile compressive dressing and splint was applied and on deflation of  the tourniquet, all fingers immediately pinked.  She was taken to the  recovery for observation in satisfactory condition.  She will be  discharged home to return to the [facility] in one week  on [place] [address].           ______________________________  Cindee Salt, M.D.    GK/MEDQ  D:  11/23/2006  T:  11/23/2006  Job:  [id number]   cc:   Kingsley Callander. Ouida Sills, MD

## 2010-06-16 ENCOUNTER — Ambulatory Visit (HOSPITAL_COMMUNITY): Payer: Commercial Managed Care - PPO | Admitting: Psychiatry

## 2010-06-29 ENCOUNTER — Ambulatory Visit (INDEPENDENT_AMBULATORY_CARE_PROVIDER_SITE_OTHER): Payer: Commercial Managed Care - PPO | Admitting: Psychology

## 2010-06-29 DIAGNOSIS — F332 Major depressive disorder, recurrent severe without psychotic features: Secondary | ICD-10-CM

## 2010-07-13 ENCOUNTER — Encounter (HOSPITAL_COMMUNITY): Payer: Commercial Managed Care - PPO | Admitting: Psychology

## 2010-11-03 LAB — POCT HEMOGLOBIN-HEMACUE: Operator id: 123881

## 2011-01-06 ENCOUNTER — Ambulatory Visit: Payer: Commercial Managed Care - PPO | Admitting: Pharmacist

## 2011-01-28 ENCOUNTER — Ambulatory Visit: Payer: Commercial Managed Care - PPO | Admitting: Pharmacist

## 2011-02-01 ENCOUNTER — Ambulatory Visit: Payer: Commercial Managed Care - PPO | Admitting: Pharmacist

## 2011-02-15 ENCOUNTER — Other Ambulatory Visit: Payer: Self-pay | Admitting: Obstetrics and Gynecology

## 2011-02-15 ENCOUNTER — Other Ambulatory Visit: Payer: Self-pay | Admitting: Gastroenterology

## 2011-02-15 DIAGNOSIS — R109 Unspecified abdominal pain: Secondary | ICD-10-CM

## 2011-02-16 ENCOUNTER — Encounter (HOSPITAL_COMMUNITY): Payer: Self-pay

## 2011-02-16 ENCOUNTER — Ambulatory Visit (HOSPITAL_COMMUNITY)
Admission: RE | Admit: 2011-02-16 | Discharge: 2011-02-16 | Disposition: A | Discharge status: Home / Self Care | Payer: 59 | Source: Ambulatory Visit | Attending: Gastroenterology | Admitting: Gastroenterology

## 2011-02-16 DIAGNOSIS — R109 Unspecified abdominal pain: Secondary | ICD-10-CM | POA: Insufficient documentation

## 2011-02-16 DIAGNOSIS — R945 Abnormal results of liver function studies: Secondary | ICD-10-CM | POA: Insufficient documentation

## 2011-02-16 DIAGNOSIS — R197 Diarrhea, unspecified: Secondary | ICD-10-CM | POA: Insufficient documentation

## 2011-02-16 HISTORY — DX: Acquired absence of other specified parts of digestive tract: Z90.49

## 2011-02-16 MED ORDER — IOHEXOL 300 MG/ML  SOLN
100.0000 mL | Freq: Once | INTRAMUSCULAR | Status: AC | PRN
  Administered 2011-02-16: 100 mL via INTRAVENOUS

## 2011-06-13 ENCOUNTER — Ambulatory Visit: Payer: 59 | Admitting: Pharmacist

## 2011-06-14 ENCOUNTER — Ambulatory Visit (INDEPENDENT_AMBULATORY_CARE_PROVIDER_SITE_OTHER): Payer: Self-pay | Admitting: Pharmacist

## 2011-06-14 ENCOUNTER — Encounter: Payer: Self-pay | Admitting: Pharmacist

## 2011-06-14 VITALS — BP 106/70 | HR 102 | Temp 97.7°F | Ht 63.0 in | Wt 236.8 lb

## 2011-06-14 DIAGNOSIS — K219 Gastro-esophageal reflux disease without esophagitis: Secondary | ICD-10-CM

## 2011-06-14 DIAGNOSIS — F418 Other specified anxiety disorders: Secondary | ICD-10-CM

## 2011-06-14 DIAGNOSIS — E039 Hypothyroidism, unspecified: Secondary | ICD-10-CM

## 2011-06-14 DIAGNOSIS — E282 Polycystic ovarian syndrome: Secondary | ICD-10-CM

## 2011-06-14 DIAGNOSIS — M069 Rheumatoid arthritis, unspecified: Secondary | ICD-10-CM

## 2011-06-14 DIAGNOSIS — F341 Dysthymic disorder: Secondary | ICD-10-CM

## 2011-06-14 NOTE — Progress Notes (Signed)
  Subjective:    Patient ID: Allison Atkinson, female    DOB: [birth year], 36 y.o.   MRN: [id number]  HPI  Patient arrives in good spirits for medication review.  Reports seeing Carylon Perches as primary care provider, Devashwar as rheumatologist, Dr, Esperanza Richters at [facility], Casimiro Needle Altheimer for endocrinology at [facility] at [facility] and Dr. Loreta Ave for Gastroenterology.  Reports being diagnosed with Rheumatoid Arthritis since 10/2009 and states she is under acceptable level of control.     Review of Systems     Objective:   Physical Exam        Assessment & Plan:  Following medication review, Consider use of metformin XR for GI symptoms in place of immediate release metformin.   Complete medication list provided to patient.  Total time in face to face medication review: 35 minutes.

## 2011-06-14 NOTE — Assessment & Plan Note (Signed)
Following medication review, Consider use of metformin XR for GI symptoms in place of immediate release metformin.

## 2011-06-14 NOTE — Patient Instructions (Signed)
Thanks for coming in today!

## 2011-06-14 NOTE — Assessment & Plan Note (Signed)
Following medication review, RA appears to be improved in control since initiation of Humira.  Complete medication list provided to patient.  Total time in face to face medication review: 35 minutes.

## 2011-06-15 NOTE — Progress Notes (Signed)
Patient ID: Allison Atkinson, female   DOB: 03-Sep-1975, 36 y.o.   MRN: [id number] Reviewed and agree with Dr. Macky Lower management.

## 2011-12-01 ENCOUNTER — Other Ambulatory Visit: Payer: Self-pay | Admitting: Obstetrics and Gynecology

## 2012-10-17 ENCOUNTER — Ambulatory Visit (HOSPITAL_COMMUNITY)
Admission: RE | Admit: 2012-10-17 | Discharge: 2012-10-17 | Disposition: A | Discharge status: Home / Self Care | Payer: BC Managed Care – PPO | Source: Ambulatory Visit | Attending: Internal Medicine | Admitting: Internal Medicine

## 2012-10-17 DIAGNOSIS — I472 Ventricular tachycardia: Secondary | ICD-10-CM

## 2012-10-17 DIAGNOSIS — R000 Tachycardia, unspecified: Secondary | ICD-10-CM

## 2012-10-17 NOTE — Progress Notes (Signed)
48 hour Holter Monitor in progress. 

## 2012-10-29 ENCOUNTER — Other Ambulatory Visit (HOSPITAL_COMMUNITY): Payer: Self-pay | Admitting: *Deleted

## 2012-10-29 DIAGNOSIS — I472 Ventricular tachycardia: Secondary | ICD-10-CM

## 2013-04-10 ENCOUNTER — Encounter: Payer: Self-pay | Admitting: *Deleted

## 2013-04-11 ENCOUNTER — Ambulatory Visit: Payer: BC Managed Care – PPO | Admitting: Cardiovascular Disease

## 2013-04-11 ENCOUNTER — Ambulatory Visit (INDEPENDENT_AMBULATORY_CARE_PROVIDER_SITE_OTHER): Payer: BC Managed Care – PPO | Admitting: Cardiovascular Disease

## 2013-04-11 ENCOUNTER — Encounter: Payer: Self-pay | Admitting: Cardiovascular Disease

## 2013-04-11 VITALS — BP 135/83 | HR 96 | Ht 63.0 in | Wt 279.0 lb

## 2013-04-11 DIAGNOSIS — T887XXA Unspecified adverse effect of drug or medicament, initial encounter: Secondary | ICD-10-CM

## 2013-04-11 DIAGNOSIS — R0602 Shortness of breath: Secondary | ICD-10-CM

## 2013-04-11 DIAGNOSIS — R000 Tachycardia, unspecified: Secondary | ICD-10-CM

## 2013-04-11 DIAGNOSIS — R002 Palpitations: Secondary | ICD-10-CM | POA: Insufficient documentation

## 2013-04-11 MED ORDER — METOPROLOL TARTRATE 25 MG PO TABS: 25.0000 mg | ORAL_TABLET | Freq: Two times a day (BID) | ORAL | Status: DC

## 2013-04-11 NOTE — Patient Instructions (Signed)
   Begin Metoprolol Tart (Lopressor) 25mg  twice a day  - new sent to pharm Continue all other medications.   Your physician has requested that you have an echocardiogram. Echocardiography is a painless test that uses sound waves to create images of your heart. It provides your doctor with information about the size and shape of your heart and how well your heart's chambers and valves are working. This procedure takes approximately one hour. There are no restrictions for this procedure. Office will contact with results via phone or letter.   Follow up in  4-6 weeks

## 2013-04-11 NOTE — Progress Notes (Signed)
Patient ID: Allison Atkinson, female   DOB: [birth year], 38 y.o.   MRN: [id number]       CARDIOLOGY CONSULT NOTE  Patient ID: Allison Atkinson MRN: [id number] DOB/AGE: 03-16-1975 37 y.o.  Admit date: (Not on file) Primary Physician Carylon Perches, MD  Reason for Consultation: palpitations  HPI: The patient is a 38 year old woman with rheumatoid arthritis, hypothyroidism, diabetes mellitus, obesity, and GERD, who has been experiencing palpitations. She wore a Holter monitor in October 2014 which showed predominantly normal sinus rhythm with rare ventricular and supraventricular ectopy. Her symptoms of shortness of breath correlated with normal sinus rhythm. An ECG performed earlier this month showed sinus tachycardia, HR 118 bpm. Labs drawn earlier this month showed hemoglobin 15.3 and TSH 2.1. Her liver transaminases were mildly elevated. Her renal function was normal. She has dyspnea with exertion but more so when she is lying down. She sleeps with one pillow lying flat. She denies paroxysmal nocturnal dyspnea. She also denies chest pain, lightheadedness, dizziness, fevers, chills, abdominal pain, and leg swelling. She had previously been taking 180 mg of long-acting diltiazem daily, and this was subsequently increased to 180 mg twice daily. This caused her heart rate to decrease to the 90 beats per minute range.   Allergies  Allergen Reactions  . Oxycodone-Acetaminophen Nausea And Vomiting    Tylox - GI     Current Outpatient Prescriptions  Medication Sig Dispense Refill  . adalimumab (HUMIRA) 40 MG/0.8ML injection Inject 0.8 mLs (40 mg total) into the skin every 14 (fourteen) days. Usual Dosing is Wed      . celecoxib (CELEBREX) 200 MG capsule Take 1 capsule (200 mg total) by mouth 2 (two) times daily.      . clonazePAM (KLONOPIN) 1 MG tablet Take 1 tablet (1 mg total) by mouth 3 (three) times daily as needed for anxiety.      Marland Kitchen diltiazem (CARDIZEM CD) 180 MG 24 hr capsule Take 1 capsule  by mouth 2 (two) times daily.       . DULoxetine (CYMBALTA) 60 MG capsule Take 60 mg by mouth 2 (two) times daily.       Marland Kitchen esomeprazole (NEXIUM) 40 MG capsule Take 1 capsule (40 mg total) by mouth daily before breakfast.      . levothyroxine (SYNTHROID, LEVOTHROID) 175 MCG tablet Take 1 tablet (175 mcg total) by mouth daily.      . metFORMIN (GLUCOPHAGE) 500 MG tablet Take 500 mg by mouth 2 (two) times daily with a meal.        No current facility-administered medications for this visit.    Past Medical History  Diagnosis Date  . History of cholecystectomy     Past Surgical History  Procedure Laterality Date  . Cholecystectomy  1999    History   Social History  . Marital Status: Married    Spouse Name: N/A    Number of Children: N/A  . Years of Education: N/A   Occupational History  . Not on file.   Social History Main Topics  . Smoking status: Never Smoker   . Smokeless tobacco: Never Used  . Alcohol Use: Yes  . Drug Use: Yes     Comment: h/o drug use  . Sexual Activity: Not on file   Other Topics Concern  . Not on file   Social History Narrative  . No narrative on file     No family history of premature CAD in 1st degree relatives.  Prior to Admission medications  Medication Sig Start Date End Date Taking? Authorizing Provider  adalimumab (HUMIRA) 40 MG/0.8ML injection Inject 0.8 mLs (40 mg total) into the skin every 14 (fourteen) days. Usual Dosing is Wed 06/14/11  Yes Sanjuana Letters, MD  celecoxib (CELEBREX) 200 MG capsule Take 1 capsule (200 mg total) by mouth 2 (two) times daily. 06/14/11  Yes Sanjuana Letters, MD  clonazePAM (KLONOPIN) 1 MG tablet Take 1 tablet (1 mg total) by mouth 3 (three) times daily as needed for anxiety. 06/14/11  Yes Sanjuana Letters, MD  diltiazem (CARDIZEM CD) 180 MG 24 hr capsule Take 1 capsule by mouth 2 (two) times daily.  03/26/13  Yes Historical Provider, MD  DULoxetine (CYMBALTA) 60 MG capsule Take 60 mg by  mouth 2 (two) times daily.  06/14/11  Yes Sanjuana Letters, MD  esomeprazole (NEXIUM) 40 MG capsule Take 1 capsule (40 mg total) by mouth daily before breakfast. 06/14/11  Yes Sanjuana Letters, MD  levothyroxine (SYNTHROID, LEVOTHROID) 175 MCG tablet Take 1 tablet (175 mcg total) by mouth daily. 06/14/11  Yes Sanjuana Letters, MD  metFORMIN (GLUCOPHAGE) 500 MG tablet Take 500 mg by mouth 2 (two) times daily with a meal.  06/14/11  Yes Sanjuana Letters, MD     Review of systems complete and found to be negative unless listed above in HPI     Physical exam Blood pressure 135/83, pulse 96, height 5\' 3"  (1.6 m), weight 279 lb (126.554 kg), SpO2 97.00%. General: NAD Neck: No JVD, no thyromegaly or thyroid nodule.  Lungs: Clear to auscultation bilaterally with normal respiratory effort. CV: Nondisplaced PMI.  Heart regular S1/S2, no S3/S4, no murmur.  No peripheral edema.  No carotid bruit.  Normal pedal pulses.  Abdomen: Soft, nontender, no hepatosplenomegaly, no distention.  Skin: Intact without lesions or rashes.  Neurologic: Alert and oriented x 3.  Psych: Normal affect. Extremities: No clubbing or cyanosis.  HEENT: Normal.   Labs:   Lab Results  Component Value Date   WBC 13.8* 02/15/2010   HGB 13.3 02/15/2010   HCT 40.4 02/15/2010   MCV 91.0 02/15/2010   PLT 351 02/15/2010   No results found for this basename: NA, K, CL, CO2, BUN, CREATININE, CALCIUM, LABALBU, PROT, BILITOT, ALKPHOS, ALT, AST, GLUCOSE,  in the last 168 hours No results found for this basename: CKTOTAL, CKMB, CKMBINDEX, TROPONINI    No results found for this basename: CHOL   No results found for this basename: HDL   No results found for this basename: LDLCALC   No results found for this basename: TRIG   No results found for this basename: CHOLHDL   No results found for this basename: LDLDIRECT        Studies: No results found.  ASSESSMENT AND PLAN:  1. Tachycardia and palpitations: I  suspect that the etiology of this may very well be due to the adalimumab (Humira) she is taking for rheumatoid arthritis, as this can lead to both tachycardia and palpitations in approximately 1-5% of individuals. She has no overt abnormalities of TSH or hemoglobin. I will start metoprolol tartrate 25 mg twice daily. He'll also obtain an echocardiogram to evaluate for structural heart disease, and to make certain that she has not developed a tachycardia-mediated cardiomyopathy.  Dispo: f/u 1 month.  Signed: 02/17/2010, M.D., F.A.C.C.  04/11/2013, 3:14 PM

## 2013-04-24 ENCOUNTER — Other Ambulatory Visit: Payer: BC Managed Care – PPO

## 2013-04-24 ENCOUNTER — Other Ambulatory Visit: Payer: Self-pay

## 2013-04-24 ENCOUNTER — Other Ambulatory Visit (INDEPENDENT_AMBULATORY_CARE_PROVIDER_SITE_OTHER): Payer: BC Managed Care – PPO

## 2013-04-24 DIAGNOSIS — R0602 Shortness of breath: Secondary | ICD-10-CM

## 2013-04-24 DIAGNOSIS — R000 Tachycardia, unspecified: Secondary | ICD-10-CM

## 2013-04-30 ENCOUNTER — Telehealth: Payer: Self-pay | Admitting: *Deleted

## 2013-04-30 NOTE — Telephone Encounter (Signed)
Message copied by Lesle Chris on Tue Apr 30, 2013  5:07 PM ------      Message from: Prentice Docker A      Created: Thu Apr 25, 2013 10:43 AM       Inform pt that pumping function is normal. ------

## 2013-04-30 NOTE — Telephone Encounter (Signed)
Notes Recorded by Lesle Chris, LPN on 3/0/0762 at 5:07 PM Patient notified and verbalized understanding.

## 2013-05-06 ENCOUNTER — Other Ambulatory Visit (HOSPITAL_COMMUNITY): Payer: Self-pay | Admitting: Hematology and Oncology

## 2013-05-09 ENCOUNTER — Encounter (HOSPITAL_COMMUNITY): Payer: Self-pay

## 2013-05-09 ENCOUNTER — Encounter (HOSPITAL_COMMUNITY): Payer: BC Managed Care – PPO

## 2013-05-09 ENCOUNTER — Encounter: Payer: Self-pay | Admitting: Cardiovascular Disease

## 2013-05-09 ENCOUNTER — Encounter (HOSPITAL_COMMUNITY): Payer: BC Managed Care – PPO | Attending: Hematology and Oncology

## 2013-05-09 ENCOUNTER — Ambulatory Visit (INDEPENDENT_AMBULATORY_CARE_PROVIDER_SITE_OTHER): Payer: BC Managed Care – PPO | Admitting: Cardiovascular Disease

## 2013-05-09 VITALS — BP 103/71 | HR 74 | Ht 64.0 in | Wt 278.0 lb

## 2013-05-09 VITALS — BP 99/69 | HR 77 | Temp 98.1°F | Resp 18 | Ht 64.5 in | Wt 278.7 lb

## 2013-05-09 DIAGNOSIS — K219 Gastro-esophageal reflux disease without esophagitis: Secondary | ICD-10-CM | POA: Insufficient documentation

## 2013-05-09 DIAGNOSIS — K7689 Other specified diseases of liver: Secondary | ICD-10-CM | POA: Insufficient documentation

## 2013-05-09 DIAGNOSIS — R000 Tachycardia, unspecified: Secondary | ICD-10-CM

## 2013-05-09 DIAGNOSIS — E039 Hypothyroidism, unspecified: Secondary | ICD-10-CM

## 2013-05-09 DIAGNOSIS — D72829 Elevated white blood cell count, unspecified: Secondary | ICD-10-CM | POA: Insufficient documentation

## 2013-05-09 DIAGNOSIS — M069 Rheumatoid arthritis, unspecified: Secondary | ICD-10-CM | POA: Insufficient documentation

## 2013-05-09 DIAGNOSIS — D729 Disorder of white blood cells, unspecified: Secondary | ICD-10-CM

## 2013-05-09 DIAGNOSIS — D7289 Other specified disorders of white blood cells: Secondary | ICD-10-CM

## 2013-05-09 DIAGNOSIS — F411 Generalized anxiety disorder: Secondary | ICD-10-CM | POA: Insufficient documentation

## 2013-05-09 DIAGNOSIS — I10 Essential (primary) hypertension: Secondary | ICD-10-CM | POA: Insufficient documentation

## 2013-05-09 DIAGNOSIS — R61 Generalized hyperhidrosis: Secondary | ICD-10-CM | POA: Insufficient documentation

## 2013-05-09 DIAGNOSIS — R002 Palpitations: Secondary | ICD-10-CM

## 2013-05-09 DIAGNOSIS — E282 Polycystic ovarian syndrome: Secondary | ICD-10-CM

## 2013-05-09 DIAGNOSIS — F319 Bipolar disorder, unspecified: Secondary | ICD-10-CM | POA: Insufficient documentation

## 2013-05-09 LAB — CBC WITH DIFFERENTIAL/PLATELET
Basophils Absolute: 0.1 10*3/uL (ref 0.0–0.1)
Basophils Relative: 0 % (ref 0–1)
Eosinophils Absolute: 0.2 10*3/uL (ref 0.0–0.7)
Eosinophils Relative: 2 % (ref 0–5)
HEMATOCRIT: 42.6 % (ref 36.0–46.0)
Hemoglobin: 14 g/dL (ref 12.0–15.0)
LYMPHS PCT: 41 % (ref 12–46)
Lymphs Abs: 4.9 10*3/uL — ABNORMAL HIGH (ref 0.7–4.0)
MCH: 29.6 pg (ref 26.0–34.0)
MCHC: 32.9 g/dL (ref 30.0–36.0)
MCV: 90.1 fL (ref 78.0–100.0)
MONO ABS: 0.7 10*3/uL (ref 0.1–1.0)
Monocytes Relative: 6 % (ref 3–12)
NEUTROS PCT: 51 % (ref 43–77)
Neutro Abs: 6.1 10*3/uL (ref 1.7–7.7)
Platelets: 396 10*3/uL (ref 150–400)
RBC: 4.73 MIL/uL (ref 3.87–5.11)
RDW: 13.4 % (ref 11.5–15.5)
WBC: 12 10*3/uL — AB (ref 4.0–10.5)

## 2013-05-09 LAB — COMPREHENSIVE METABOLIC PANEL
ALT: 62 U/L — AB (ref 0–35)
AST: 32 U/L (ref 0–37)
Albumin: 3.8 g/dL (ref 3.5–5.2)
Alkaline Phosphatase: 77 U/L (ref 39–117)
BUN: 7 mg/dL (ref 6–23)
CALCIUM: 9.2 mg/dL (ref 8.4–10.5)
CHLORIDE: 99 meq/L (ref 96–112)
CO2: 27 meq/L (ref 19–32)
Creatinine, Ser: 0.76 mg/dL (ref 0.50–1.10)
GFR calc Af Amer: >90 mL/min (ref >90)
Glucose, Bld: 99 mg/dL (ref 70–99)
Potassium: 4.4 mEq/L (ref 3.7–5.3)
SODIUM: 138 meq/L (ref 137–147)
Total Bilirubin: 0.3 mg/dL (ref 0.3–1.2)
Total Protein: 7.7 g/dL (ref 6.0–8.3)

## 2013-05-09 LAB — RETICULOCYTES
RBC.: 4.73 MIL/uL (ref 3.87–5.11)
Retic Count, Absolute: 80.4 10*3/uL (ref 19.0–186.0)
Retic Ct Pct: 1.7 % (ref 0.4–3.1)

## 2013-05-09 LAB — LACTATE DEHYDROGENASE: LDH: 133 U/L (ref 94–250)

## 2013-05-09 NOTE — Progress Notes (Signed)
[facility] Allison Atkinson, M.D.  NEW PATIENT EVALUATION   Name: Allison Atkinson Date: 05/09/2013 MRN: [id number] DOB: 04-15-1975  PCP: Allison Perches, MD   REFERRING PHYSICIAN: Carylon Perches, MD  REASON FOR REFERRAL: Leukocytosis     HISTORY OF PRESENT ILLNESS:Allison Atkinson is a 38 y.o. female who is referred by her rheumatologist because of elevated white cell count known at least since November of 2014. The leukocytosis primarily neutrophilic. Patient is a known case of polycystic ovary disease as well as rheumatoid arthritis for which he has been using Humira for the last 3 years. She has had drenching night sweats for the past 4-6 months and continues to menstruate for 3 days every 28 days. She denies any lower extremity swelling or redness, easy satiety, cough, wheezing, PND, orthopnea, palpitations, with controlled joint discomfort using celecoxib as well. She has experienced diarrhea for most of her life and denies any melena, hematochezia, nausea, vomiting, skin rash, headache, or seizures.   PAST MEDICAL HISTORY:  has a past medical history of History of cholecystectomy; Thyroid disease; Fatty liver disease, nonalcoholic; Polycystic ovarian disease; RA (rheumatoid arthritis); Bipolar disorder; Anxiety; and Hypertension.     PAST SURGICAL HISTORY: Past Surgical History  Procedure Laterality Date  . Cholecystectomy  1999  . Knee arthroscopy Right   . Colonoscopy w/ biopsies  2010  . Esophagogastroduodenoscopy  2010     CURRENT MEDICATIONS: has a current medication list which includes the following prescription(s): adalimumab, b complex vitamins, celecoxib, vitamin d3, clonazepam, diltiazem, duloxetine, esomeprazole, levothyroxine, metformin, metoprolol tartrate, and vitamin e.   ALLERGIES: Oxycodone-acetaminophen and Latex   SOCIAL HISTORY:  reports that she has never smoked. She has never used smokeless tobacco. She reports  that she drinks alcohol. She reports that she does not use illicit drugs.   FAMILY HISTORY: family history includes Alcohol abuse in her father; Hypertension in her mother.    REVIEW OF SYSTEMS:  Other than that discussed above is noncontributory.    PHYSICAL EXAM:  height is 5' 4.5" (1.638 m) and weight is 278 lb 11.2 oz (126.417 kg). Her oral temperature is 98.1 F (36.7 C). Her blood pressure is 99/69 and her pulse is 77. Her respiration is 18.    GENERAL:alert, no distress and comfortable. Morbidly obese. SKIN: skin color, texture, turgor are normal, no rashes or significant lesions EYES: normal, Conjunctiva are pink and non-injected, sclera clear OROPHARYNX:no exudate, no erythema and lips, buccal mucosa, and tongue normal  NECK: supple, thyroid normal size, non-tender, without nodularity CHEST: Normal AP diameter with no breast masses. LYMPH:  no palpable lymphadenopathy in the cervical, axillary or inguinal LUNGS: clear to auscultation and percussion with normal breathing effort HEART: regular rate & rhythm and no murmurs ABDOMEN:abdomen soft, non-tender and normal bowel sounds. Liver and spleen not appreciated due to adiposity. MUSCULOSKELETALl:no cyanosis of digits, no clubbing or edema. No stigmata of chronic rheumatoid disease.  NEURO: alert & oriented x 3 with fluent speech, no focal motor/sensory deficits    LABORATORY DATA:                          WBC               Results for Allison Atkinson (MRN [id number]) as of 05/09/2013 10:30  Ref. Range 05/09/2013 10:43  Sodium Latest Range: 137-147 mEq/L 138  Potassium Latest Range: 3.7-5.3 mEq/L 4.4  Chloride Latest Range: 96-112 mEq/L 99  CO2 Latest Range: 19-32 mEq/L 27  BUN Latest Range: 6-23 mg/dL 7  Creatinine Latest Range: 0.50-1.10 mg/dL 1.82  Calcium Latest Range: 8.4-10.5 mg/dL 9.2  GFR calc non Af Amer Latest Range: >90 mL/min >90  GFR calc Af Amer Latest Range: >90 mL/min >90  Glucose Latest Range: 70-99 mg/dL  99  WBC Latest Range: 4.0-10.5 K/uL 12.0 (H)  RBC Latest Range: 3.87-5.11 MIL/uL 4.73  Hemoglobin Latest Range: 12.0-15.0 g/dL 99.3  HCT Latest Range: 36.0-46.0 % 42.6  MCV Latest Range: 78.0-100.0 fL 90.1  MCHC Latest Range: 30.0-36.0 g/dL 71.6  RDW Latest Range: 11.5-15.5 % 13.4  Platelets Latest Range: 150-400 K/uL 396  Neutrophils Relative % Latest Range: 43-77 % 51  Lymphocytes Relative Latest Range: 12-46 % 41  Monocytes Relative Latest Range: 3-12 % 6  Eosinophils Relative Latest Range: 0-5 % 2  Basophils Relative Latest Range: 0-1 % 0  NEUT# Latest Range: 1.7-7.7 K/uL 6.1  Lymphocytes Absolute Latest Range: 0.7-4.0 K/uL 4.9 (H)  Monocytes Absolute Latest Range: 0.1-1.0 K/uL 0.7  Eosinophils Absolute Latest Range: 0.0-0.7 K/uL 0.2  Basophils Absolute Latest Range: 0.0-0.1 K/uL 0.1    12/19/2012:       11.2 01/25/2013:       13.8 03/27/2013:       12.6          @RADIOGRAPHY : No results found.  PATHOLOGY: Peripheral smear reveals neutrophils are without premature forms.   IMPRESSION:  #1. Neutrophilic leukocytosis in the past, now with lymphocytic leukocytosis, probably reactive. #2. Drenching night sweats after being on Humira for 3 years, rule out lymphoproliferative disorder. Patient is still premenopausal. #3. Polycystic ovary disease #4. Hypertension, controlled. #5. Rheumatoid arthritis. #6. Hypothyroidism, on treatment. #7. Gastroesophageal reflux disease. #8. Morbid obesity.   PLAN:  #1. CT scans of the chest abdomen and pelvis with contrast to rule out lymphoma. #2. JAK-2 and BCR-ABL along with leukocyte alkaline phosphatase. #3. Continue current medications. #4. Followup in 2 weeks after additional lab tests and scans are completed.  I appreciate the opportunity of sharing in her care.   , MD 05/09/2013 1:11 PM

## 2013-05-09 NOTE — Patient Instructions (Signed)
[facility] Discharge Instructions  RECOMMENDATIONS MADE BY THE CONSULTANT AND ANY TEST RESULTS WILL BE SENT TO YOUR REFERRING PHYSICIAN.  EXAM FINDINGS BY THE PHYSICIAN TODAY AND SIGNS OR SYMPTOMS TO REPORT TO CLINIC OR PRIMARY PHYSICIAN: Exam and findings as discussed by Dr. Zigmund Daniel.  Will check some blood work today and will do scans of your chest, abdomen and pelvis to evaluate for possible lymphoma.  MEDICATIONS PRESCRIBED:  none  INSTRUCTIONS/FOLLOW-UP: Follow-up after scans.  Thank you for choosing [facility] to provide your oncology and hematology care.  To afford each patient quality time with our providers, please arrive at least 15 minutes before your scheduled appointment time.  With your help, our goal is to use those 15 minutes to complete the necessary work-up to ensure our physicians have the information they need to help with your evaluation and healthcare recommendations.    Effective January 1st, 2014, we ask that you re-schedule your appointment with our physicians should you arrive 10 or more minutes late for your appointment.  We strive to give you quality time with our providers, and arriving late affects you and other patients whose appointments are after yours.    Again, thank you for choosing [facility].  Our hope is that these requests will decrease the amount of time that you wait before being seen by our physicians.       _____________________________________________________________  Should you have questions after your visit to [facility], please contact our office at [phone] between the hours of 8:30 a.m. and 5:00 p.m.  Voicemails left after 4:30 p.m. will not be returned until the following business day.  For prescription refill requests, have your pharmacy contact our office with your prescription refill request.

## 2013-05-09 NOTE — Progress Notes (Signed)
Patient ID: Allison Atkinson, female   DOB: Jul 27, 1975, 38 y.o.   MRN: [id number]      SUBJECTIVE: The patient is here to followup for her tachycardia and palpitations. I started metoprolol at her last visit. She was already taking diltiazem. I obtained an echocardiogram which showed vigorous left ventricular systolic function, EF 65-70%, mild LVH, and grade 1 diastolic dysfunction.  She is feeling much better, and denies chest pain, palpitations, dizziness and lightheadedness.  Allergies  Allergen Reactions  . Oxycodone-Acetaminophen Nausea And Vomiting    Tylox - GI   . Latex Rash    Raised red rash at areas of contact    Current Outpatient Prescriptions  Medication Sig Dispense Refill  . adalimumab (HUMIRA) 40 MG/0.8ML injection Inject 0.8 mLs (40 mg total) into the skin every 14 (fourteen) days. Usual Dosing is Wed      . b complex vitamins tablet Take 1 tablet by mouth daily.      . celecoxib (CELEBREX) 200 MG capsule Take 1 capsule (200 mg total) by mouth 2 (two) times daily.      . Cholecalciferol (VITAMIN D3) 3000 UNITS TABS Take 1 tablet by mouth daily.      . clonazePAM (KLONOPIN) 1 MG tablet Take 1 tablet (1 mg total) by mouth 3 (three) times daily as needed for anxiety.      Marland Kitchen diltiazem (CARDIZEM CD) 180 MG 24 hr capsule Take 1 capsule by mouth 2 (two) times daily.       . DULoxetine (CYMBALTA) 60 MG capsule Take 60 mg by mouth 2 (two) times daily.       Marland Kitchen esomeprazole (NEXIUM) 40 MG capsule Take 1 capsule (40 mg total) by mouth daily before breakfast.      . levothyroxine (SYNTHROID, LEVOTHROID) 175 MCG tablet Take 1 tablet (175 mcg total) by mouth daily.      . metFORMIN (GLUCOPHAGE) 500 MG tablet Take 500 mg by mouth 2 (two) times daily with a meal.       . metoprolol tartrate (LOPRESSOR) 25 MG tablet Take 1 tablet (25 mg total) by mouth 2 (two) times daily.  60 tablet  6  . VITAMIN E PO Take 1 capsule by mouth daily.       No current facility-administered medications  for this visit.    Past Medical History  Diagnosis Date  . History of cholecystectomy   . Thyroid disease     hypothyroid  . Fatty liver disease, nonalcoholic   . Polycystic ovarian disease   . RA (rheumatoid arthritis)   . Bipolar disorder   . Anxiety   . Hypertension     Past Surgical History  Procedure Laterality Date  . Cholecystectomy  1999  . Knee arthroscopy Right   . Colonoscopy w/ biopsies  2010  . Esophagogastroduodenoscopy  2010    History   Social History  . Marital Status: Married    Spouse Name: N/A    Number of Children: N/A  . Years of Education: N/A   Occupational History  . Not on file.   Social History Main Topics  . Smoking status: Never Smoker   . Smokeless tobacco: Never Used  . Alcohol Use: Yes     Comment: no longer drinks alcohol  . Drug Use: No  . Sexual Activity: Yes   Other Topics Concern  . Not on file   Social History Narrative  . No narrative on file     Filed Vitals:   05/09/13 1458  Height: 5\' 4"  (1.626 m)  Weight: 278 lb (126.1 kg)   BP 103/71 Pulse 74    PHYSICAL EXAM General: NAD Neck: No JVD, no thyromegaly. Lungs: Clear to auscultation bilaterally with normal respiratory effort. CV: Nondisplaced PMI.  Regular rate and rhythm, normal S1/S2, no S3/S4, no murmur. No pretibial or periankle edema.  No carotid bruit.  Normal pedal pulses.  Abdomen: Soft, nontender, no hepatosplenomegaly, no distention.  Neurologic: Alert and oriented x 3.  Psych: Normal affect. Extremities: No clubbing or cyanosis.   ECG: reviewed and available in electronic records.      ASSESSMENT AND PLAN: 1. Tachycardia and palpitations: Heart rate under better control with the addition of metoprolol. Again, I suspect that the etiology of this may very well be due to the adalimumab (Humira) she is taking for rheumatoid arthritis, as this can lead to both tachycardia and palpitations in approximately 1-5% of individuals. She has no overt  abnormalities of TSH or hemoglobin. I will continue both metoprolol and diltiazem at current doses.  Dispo: f/u 1 year.  , M.D., F.A.C.C.

## 2013-05-09 NOTE — Patient Instructions (Signed)
Continue all current medications. Your physician wants you to follow up in:  1 year.  You will receive a reminder letter in the mail one-two months in advance.  If you don't receive a letter, please call our office to schedule the follow up appointment   

## 2013-05-13 ENCOUNTER — Ambulatory Visit (HOSPITAL_COMMUNITY): Payer: BC Managed Care – PPO

## 2013-05-13 LAB — LEUKOCYTE ALKALINE PHOSPHATASE: Leukocyte Alkaline  Phos Stain: 168 — ABNORMAL HIGH (ref 33–149)

## 2013-05-16 ENCOUNTER — Ambulatory Visit (HOSPITAL_COMMUNITY)
Admission: RE | Admit: 2013-05-16 | Discharge: 2013-05-16 | Disposition: A | Discharge status: Home / Self Care | Payer: BC Managed Care – PPO | Source: Ambulatory Visit | Attending: Hematology and Oncology | Admitting: Hematology and Oncology

## 2013-05-16 DIAGNOSIS — M069 Rheumatoid arthritis, unspecified: Secondary | ICD-10-CM

## 2013-05-16 DIAGNOSIS — R61 Generalized hyperhidrosis: Secondary | ICD-10-CM | POA: Insufficient documentation

## 2013-05-16 DIAGNOSIS — D7289 Other specified disorders of white blood cells: Secondary | ICD-10-CM | POA: Insufficient documentation

## 2013-05-16 DIAGNOSIS — D72829 Elevated white blood cell count, unspecified: Secondary | ICD-10-CM | POA: Insufficient documentation

## 2013-05-16 DIAGNOSIS — D729 Disorder of white blood cells, unspecified: Secondary | ICD-10-CM

## 2013-05-16 MED ORDER — IOHEXOL 300 MG/ML  SOLN
100.0000 mL | Freq: Once | INTRAMUSCULAR | Status: AC | PRN
  Administered 2013-05-16: 100 mL via INTRAVENOUS

## 2013-05-23 ENCOUNTER — Encounter (HOSPITAL_BASED_OUTPATIENT_CLINIC_OR_DEPARTMENT_OTHER): Payer: BC Managed Care – PPO

## 2013-05-23 ENCOUNTER — Encounter (HOSPITAL_COMMUNITY): Payer: BC Managed Care – PPO

## 2013-05-23 ENCOUNTER — Encounter (HOSPITAL_COMMUNITY): Payer: Self-pay

## 2013-05-23 VITALS — BP 94/66 | HR 78 | Temp 98.0°F | Resp 16 | Wt 277.7 lb

## 2013-05-23 DIAGNOSIS — E282 Polycystic ovarian syndrome: Secondary | ICD-10-CM

## 2013-05-23 DIAGNOSIS — D729 Disorder of white blood cells, unspecified: Secondary | ICD-10-CM

## 2013-05-23 DIAGNOSIS — E039 Hypothyroidism, unspecified: Secondary | ICD-10-CM

## 2013-05-23 DIAGNOSIS — M069 Rheumatoid arthritis, unspecified: Secondary | ICD-10-CM

## 2013-05-23 DIAGNOSIS — D72829 Elevated white blood cell count, unspecified: Secondary | ICD-10-CM

## 2013-05-23 NOTE — Patient Instructions (Signed)
[facility] Discharge Instructions  RECOMMENDATIONS MADE BY THE CONSULTANT AND ANY TEST RESULTS WILL BE SENT TO YOUR REFERRING PHYSICIAN.  EXAM FINDINGS BY THE PHYSICIAN TODAY AND SIGNS OR SYMPTOMS TO REPORT TO CLINIC OR PRIMARY PHYSICIAN: Exam and findings as discussed by Dr. Zigmund Daniel.  Your abnormal blood work is reactive and we don't need to do anything.  MEDICATIONS PRESCRIBED:  none  INSTRUCTIONS/FOLLOW-UP: Discharged from clinic.  Thank you for choosing [facility] to provide your oncology and hematology care.  To afford each patient quality time with our providers, please arrive at least 15 minutes before your scheduled appointment time.  With your help, our goal is to use those 15 minutes to complete the necessary work-up to ensure our physicians have the information they need to help with your evaluation and healthcare recommendations.    Effective January 1st, 2014, we ask that you re-schedule your appointment with our physicians should you arrive 10 or more minutes late for your appointment.  We strive to give you quality time with our providers, and arriving late affects you and other patients whose appointments are after yours.    Again, thank you for choosing [facility].  Our hope is that these requests will decrease the amount of time that you wait before being seen by our physicians.       _____________________________________________________________  Should you have questions after your visit to [facility], please contact our office at [phone] between the hours of 8:30 a.m. and 5:00 p.m.  Voicemails left after 4:30 p.m. will not be returned until the following business day.  For prescription refill requests, have your pharmacy contact our office with your prescription refill request.

## 2013-05-23 NOTE — Progress Notes (Signed)
[place]  OFFICE PROGRESS Resa Miner, MD [address] [place] [id number]  DIAGNOSIS: Leukocytosis - Plan: CBC with Differential, CBC with Differential  Neutrophilic leukocytosis  Hypothyroidism  PCOS (polycystic ovarian syndrome)  Rheumatoid arthritis(714.0)  Chief Complaint  Patient presents with  . Leukocytosis    CURRENT THERAPY: Currently undergoing evaluation for leukocytosis.  INTERVAL HISTORY: Allison Atkinson 38 y.o. female returns for followup after additional testing to explain neutrophilic leukocytosis. Primary complaint is still joint discomfort in the right knee as well as the left SI joint. She denies any fever, night sweats, sore throat, and is about to experience her menstrual period. Appetite is good with no nausea, vomiting, headache sinus drainage, earache, sore throat, diarrhea, constipation, dysuria, hematuria, incontinence, skin rash, headache, or seizures.  MEDICAL HISTORY: Past Medical History  Diagnosis Date  . History of cholecystectomy   . Thyroid disease     hypothyroid  . Fatty liver disease, nonalcoholic   . Polycystic ovarian disease   . RA (rheumatoid arthritis)   . Bipolar disorder   . Anxiety   . Hypertension     INTERIM HISTORY: has Rheumatoid arthritis; Hypothyroidism; PCOS (polycystic ovarian syndrome); Depression with anxiety; GERD (gastroesophageal reflux disease); Palpitations; and Hypertension on her problem list.    ALLERGIES:  is allergic to oxycodone-acetaminophen and latex.  MEDICATIONS: has a current medication list which includes the following prescription(s): adalimumab, b complex vitamins, celecoxib, vitamin d3, clonazepam, diltiazem, duloxetine, esomeprazole, levothyroxine, metformin, metoprolol tartrate, and vitamin e.  SURGICAL HISTORY:  Past Surgical History  Procedure Laterality Date  . Cholecystectomy  1999  . Knee arthroscopy Right   .  Colonoscopy w/ biopsies  2010  . Esophagogastroduodenoscopy  2010    FAMILY HISTORY: family history includes Alcohol abuse in her father; Hypertension in her mother.  SOCIAL HISTORY:  reports that she has never smoked. She has never used smokeless tobacco. She reports that she drinks alcohol. She reports that she does not use illicit drugs.  REVIEW OF SYSTEMS:  Other than that discussed above is noncontributory.  PHYSICAL EXAMINATION: ECOG PERFORMANCE STATUS: 1 - Symptomatic but completely ambulatory  Blood pressure 94/66, pulse 78, temperature 98 F (36.7 C), temperature source Oral, resp. rate 16, weight 277 lb 11.2 oz (125.964 kg), last menstrual period 05/03/2013.  GENERAL:alert, no distress and comfortable SKIN: skin color, texture, turgor are normal, no rashes or significant lesions EYES: PERLA; Conjunctiva are pink and non-injected, sclera clear SINUSES: No redness or tenderness over maxillary or ethmoid sinuses. Bilateral diarrhea. OROPHARYNX:no exudate, no erythema on lips, buccal mucosa, or tongue. NECK: supple, thyroid normal size, non-tender, without nodularity. No masses CHEST: Normal AP diameter with no breast masses. LYMPH:  no palpable lymphadenopathy in the cervical, axillary or inguinal LUNGS: clear to auscultation and percussion with normal breathing effort HEART: regular rate & rhythm and no murmurs. ABDOMEN:abdomen soft, non-tender and normal bowel sounds MUSCULOSKELETAL:no cyanosis of digits and no clubbing. Range of motion normal.  NEURO: alert & oriented x 3 with fluent speech, no focal motor/sensory deficits   LABORATORY DATA: Office Visit on 05/09/2013  Component Date Value Ref Range Status  . WBC 05/09/2013 12.0* 4.0 - 10.5 K/uL Final  . RBC 05/09/2013 4.73  3.87 - 5.11 MIL/uL Final  . Hemoglobin 05/09/2013 14.0  12.0 - 15.0 g/dL Final  . HCT 05/09/2013 42.6  36.0 - 46.0 % Final  . MCV 05/09/2013 90.1  78.0 -  100.0 fL Final  . MCH 05/09/2013 29.6   26.0 - 34.0 pg Final  . MCHC 05/09/2013 32.9  30.0 - 36.0 g/dL Final  . RDW 05/09/2013 13.4  11.5 - 15.5 % Final  . Platelets 05/09/2013 396  150 - 400 K/uL Final  . Neutrophils Relative % 05/09/2013 51  43 - 77 % Final  . Neutro Abs 05/09/2013 6.1  1.7 - 7.7 K/uL Final  . Lymphocytes Relative 05/09/2013 41  12 - 46 % Final  . Lymphs Abs 05/09/2013 4.9* 0.7 - 4.0 K/uL Final  . Monocytes Relative 05/09/2013 6  3 - 12 % Final  . Monocytes Absolute 05/09/2013 0.7  0.1 - 1.0 K/uL Final  . Eosinophils Relative 05/09/2013 2  0 - 5 % Final  . Eosinophils Absolute 05/09/2013 0.2  0.0 - 0.7 K/uL Final  . Basophils Relative 05/09/2013 0  0 - 1 % Final  . Basophils Absolute 05/09/2013 0.1  0.0 - 0.1 K/uL Final  . Retic Ct Pct 05/09/2013 1.7  0.4 - 3.1 % Final  . RBC. 05/09/2013 4.73  3.87 - 5.11 MIL/uL Final  . Retic Count, Manual 05/09/2013 80.4  19.0 - 186.0 K/uL Final  . Sodium 05/09/2013 138  137 - 147 mEq/L Final  . Potassium 05/09/2013 4.4  3.7 - 5.3 mEq/L Final  . Chloride 05/09/2013 99  96 - 112 mEq/L Final  . CO2 05/09/2013 27  19 - 32 mEq/L Final  . Glucose, Bld 05/09/2013 99  70 - 99 mg/dL Final  . BUN 05/09/2013 7  6 - 23 mg/dL Final  . Creatinine, Ser 05/09/2013 0.76  0.50 - 1.10 mg/dL Final  . Calcium 05/09/2013 9.2  8.4 - 10.5 mg/dL Final  . Total Protein 05/09/2013 7.7  6.0 - 8.3 g/dL Final  . Albumin 05/09/2013 3.8  3.5 - 5.2 g/dL Final  . AST 05/09/2013 32  0 - 37 U/L Final  . ALT 05/09/2013 62* 0 - 35 U/L Final  . Alkaline Phosphatase 05/09/2013 77  39 - 117 U/L Final  . Total Bilirubin 05/09/2013 0.3  0.3 - 1.2 mg/dL Final  . GFR calc non Af Amer 05/09/2013 >90  >90 mL/min Final  . GFR calc Af Amer 05/09/2013 >90  >90 mL/min Final   Comment: (NOTE)                          The eGFR has been calculated using the CKD EPI equation.                          This calculation has not been validated in all clinical situations.                          eGFR's persistently <90  mL/min signify possible Chronic Kidney                          Disease.  Marland Kitchen LDH 05/09/2013 133  94 - 250 U/L Final  . BCR ABL1 / ABL1 05/09/2013 0.000   Final  . BCR ABL1 / ABL1 IS 05/09/2013 0.000   Final  . Interpretation - BCRQ 05/09/2013 REPORT   Final   Comment: (NOTE)                          The P190 and P210 BCR-ABL1  fusion transcripts are NOT                          detected.                          Reverse transcription real-time PCR is performed for                          the P190 and P210 BCR-ABL1 transcripts associated with                          the t(9;22) chromosomal translocation. For P190,                          results are expressed as a percent ratio of BCR-ABL1                          to the ABL1 transcript, and further adjusted to the                          international scale (IS) for P210.                          Assay sensitivity is dependent on RNA quality and                          sample cellularity but is usually at least 4-logs                          below BCR-ABL1 baseline transcript levels. Reference                          range is 0.000% BCR-ABL1/ABL1.                          This test was developed and its performance                          characteristics have been determined by [employer], [place], [place].                          Performance characteristics refer to the                          analytical performance of the test.                                                                               Elmyra Ricks C. Christacos, Ph.D., [facility]  Performed at [facility]  . Clinical Indication (JAK2) 05/09/2013 PENDING   Incomplete  . Specimen Source (JAK2) 05/09/2013 PENDING   Incomplete  . Block / Specimen ID (JAK2) 05/09/2013 PENDING   Incomplete  . Gene Result (JAK2) 05/09/2013 PENDING   Incomplete  . Interpretation (JAK2) 05/09/2013 PENDING    Incomplete  . JAK2 GenotypR 05/09/2013 Not Detected   Final   Comment: (NOTE)                                   ** Normal Reference Range: Not Detected **                          Clinical Utility:                          The somatic acquired mutation affecting Janus Tyrosine Kinase 2 (JAK2                          V617F) in exon 14 is associated with myeloproliferative disorders                          (MPD).  JAK2 V617F has been found to be the most common molecular                          abnormality in patients with Polycythemia Vera (PV, >90%) or Essential                          Thombocythemia (ET, 35% - 70%).  The lowest frequency is found in IMF                          patients (chronic Idiopathic Myelofibrosis, 50%).  The presence of the                          JAK2 mutation causes activation of molecular signals that lead to                          proliferation of hematopoietic precursors outside of their normal                          pathways including erythropoietin-independent erythroid colony growth                          in most patients with PV and some patients with ET.  The JAK2 mutation                          is considered the main oncogenic event responsible for PV development                          but its precise role in ET and IMF remains questionable and may                          suggest the requirement of other genetic events to induce these  pathologies.  The absence of JAK2 V617F does not exclude other                          changes, including in the exon 12.                          Test Methodology:                          Patient DNA is assayed using allele specific PCR technology from                          [employer] and is tested using the Roche Light Cycler Real Time PCR                          analyzer. This assay is reported as detected when >5% of cells show                          the presence of the JAK2  V617F mutation.                          This test was performed using a kit that has not been cleared or                          approved by the FDA.  The analytical performance characteristics of                          this test have been determined by [facility].  This                          test may not be used for diagnostic, prognostic or monitoring purposes                          without confirmation by other medically established means.                          Performed at [facility]  . Leukocyte Alkaline  Phos Stain 05/09/2013 168* 33 - 149 Final   Comment: (NOTE)                          INTERPRETIVE INFORMATION: Leukocyte Alkaline Phosphatase Smear                          Access complete set of age- and/ or gender specific reference                           intervals for this test in the [facility] Test Directory                           (ProgramInsider.com.pt).                          Performed at [facility]  . P190 BCR ABL1 05/09/2013  Not Detected   Final   Performed at [facility]  . P210 BCR ABL1 05/09/2013 Not Detected   Final   Performed at [facility]: Peripheral smear failed to reveal evidence of premature forms.  Urinalysis    Component Value Date/Time   COLORURINE YELLOW 02/15/2010 0528   APPEARANCEUR CLOUDY* 02/15/2010 0528   LABSPEC 1.024 02/15/2010 0528   PHURINE 6.0 02/15/2010 0528   HGBUR NEGATIVE 02/15/2010 0528   BILIRUBINUR NEGATIVE 02/15/2010 0528   KETONESUR NEGATIVE 02/15/2010 0528   PROTEINUR NEGATIVE 02/15/2010 0528   UROBILINOGEN 0.2 02/15/2010 0528   NITRITE NEGATIVE 02/15/2010 0528   LEUKOCYTESUR NEGATIVE MICROSCOPIC NOT DONE ON URINES WITH NEGATIVE PROTEIN, BLOOD, LEUKOCYTES, NITRITE, OR GLUCOSE <1000 mg/dL. 02/15/2010 0528    RADIOGRAPHIC STUDIES: Ct Chest W Contrast  05/16/2013   CLINICAL DATA:  Elevated white blood cell count. Night sweats. Neutrophilic leukocytosis. Rheumatoid  arthritis. Possible lymphoma. Alcohol use.  EXAM: CT CHEST, ABDOMEN, AND PELVIS WITH CONTRAST  TECHNIQUE: Multidetector CT imaging of the chest, abdomen and pelvis was performed following the standard protocol during bolus administration of intravenous contrast.  CONTRAST:  166m OMNIPAQUE IOHEXOL 300 MG/ML  SOLN  COMPARISON:  Plain film chest 01/09/2009. Abdominal pelvic CT 02/16/2011. No prior chest CT.  FINDINGS: CT CHEST FINDINGS  Lungs/Pleura: No suspicious pulmonary nodule or mass. No pleural fluid.  Heart/Mediastinum: No supraclavicular adenopathy. No axillary adenopathy. Normal heart size, without pericardial effusion. No mediastinal or hilar adenopathy.  CT ABDOMEN AND PELVIS FINDINGS  Abdomen/Pelvis: Moderate degradation, secondary to patient body habitus and resultant artifact. Liver upper normal in size, 17.8 cm craniocaudal. Mildly prominent caudate lobe and lateral segment left liver lobe. Example image 53.  Normal spleen, stomach, pancreas, adrenal glands. Cholecystectomy, without biliary ductal dilatation. Normal kidneys. No retroperitoneal or retrocrural adenopathy. Normal colon, appendix, and terminal ileum. Normal small bowel without abdominal ascites. No pelvic adenopathy. Normal urinary bladder and uterus, without adnexal mass or significant free pelvic fluid.  Bones/Musculoskeletal: No acute osseous abnormality. Degenerative disc disease is age advanced at L4-5 and L5-S1.  IMPRESSION: 1. Decreased sensitivity and specificity exam due to technique related factors, as described above. 2. Given this factor, no evidence of adenopathy to suggest active lymphoma within the chest, abdomen, or pelvis. 3. Hepatic morphology for which mild cirrhosis cannot be excluded. Correlate with risk factors.   Electronically Signed   By: KAbigail MiyamotoM.D.   On: 05/16/2013 16:10   Ct Abdomen Pelvis W Contrast  05/16/2013   CLINICAL DATA:  Elevated white blood cell count. Night sweats. Neutrophilic leukocytosis.  Rheumatoid arthritis. Possible lymphoma. Alcohol use.  EXAM: CT CHEST, ABDOMEN, AND PELVIS WITH CONTRAST  TECHNIQUE: Multidetector CT imaging of the chest, abdomen and pelvis was performed following the standard protocol during bolus administration of intravenous contrast.  CONTRAST:  1069mOMNIPAQUE IOHEXOL 300 MG/ML  SOLN  COMPARISON:  Plain film chest 01/09/2009. Abdominal pelvic CT 02/16/2011. No prior chest CT.  FINDINGS: CT CHEST FINDINGS  Lungs/Pleura: No suspicious pulmonary nodule or mass. No pleural fluid.  Heart/Mediastinum: No supraclavicular adenopathy. No axillary adenopathy. Normal heart size, without pericardial effusion. No mediastinal or hilar adenopathy.  CT ABDOMEN AND PELVIS FINDINGS  Abdomen/Pelvis: Moderate degradation, secondary to patient body habitus and resultant artifact. Liver upper normal in size, 17.8 cm craniocaudal. Mildly prominent caudate lobe and lateral segment left liver lobe. Example image 53.  Normal spleen, stomach, pancreas, adrenal glands. Cholecystectomy, without biliary ductal dilatation. Normal kidneys. No retroperitoneal or  retrocrural adenopathy. Normal colon, appendix, and terminal ileum. Normal small bowel without abdominal ascites. No pelvic adenopathy. Normal urinary bladder and uterus, without adnexal mass or significant free pelvic fluid.  Bones/Musculoskeletal: No acute osseous abnormality. Degenerative disc disease is age advanced at L4-5 and L5-S1.  IMPRESSION: 1. Decreased sensitivity and specificity exam due to technique related factors, as described above. 2. Given this factor, no evidence of adenopathy to suggest active lymphoma within the chest, abdomen, or pelvis. 3. Hepatic morphology for which mild cirrhosis cannot be excluded. Correlate with risk factors.   Electronically Signed   By: Abigail Miyamoto M.D.   On: 05/16/2013 16:10    ASSESSMENT:  #1. Reactive leukocytosis. #2. Rheumatoid arthritis, on treatment. #3. Polycystic ovary disease, on  treatment. #4. Hypertension, controlled. #5. No evidence of lymphoproliferative disorder on CT scans. #6. Hypothyroidism, on treatment. #7. Gastroesophageal reflux disease, on treatment. #8. Morbid obesity.   PLAN:  #1. No further recommendations regarding appropriately elevated leukocyte count. No evidence of lymphoproliferative disorder. #2. Patient was discharged from this clinic but certainly she was told she could return any time should new findings occur that may require further evaluation.   All questions were answered. The patient knows to call the clinic with any problems, questions or concerns. We can certainly see the patient much sooner if necessary.   I spent 25 minutes counseling the patient face to face. The total time spent in the appointment was 30 minutes.    Farrel Gobble, MD 05/23/2013 1:24 PM  DISCLAIMER:  This note was dictated with voice recognition software.  Similar sounding words can inadvertently be transcribed inaccurately and may not be corrected upon review.

## 2013-06-14 LAB — P190 BCR-ABL 1: P190 BCR ABL1: NOT DETECTED

## 2013-06-14 LAB — BCR/ABL GENE REARRANGEMENT QNT, PCR
BCR ABL1 / ABL1 IS: 0 %
BCR ABL1/ABL1: 0 %

## 2013-06-14 LAB — P210 BCR-ABL 1: P210 BCR ABL1: NOT DETECTED

## 2013-06-24 LAB — JAK2 GENOTYPR: JAK2 GENOTYPR: NOT DETECTED

## 2014-01-20 ENCOUNTER — Other Ambulatory Visit: Payer: Self-pay | Admitting: Cardiovascular Disease

## 2014-02-28 ENCOUNTER — Other Ambulatory Visit (HOSPITAL_COMMUNITY): Payer: Self-pay | Admitting: Internal Medicine

## 2014-02-28 ENCOUNTER — Ambulatory Visit (HOSPITAL_COMMUNITY)
Admission: RE | Admit: 2014-02-28 | Discharge: 2014-02-28 | Disposition: A | Discharge status: Home / Self Care | Payer: BLUE CROSS/BLUE SHIELD | Source: Ambulatory Visit | Attending: Internal Medicine | Admitting: Internal Medicine

## 2014-02-28 DIAGNOSIS — R05 Cough: Secondary | ICD-10-CM | POA: Insufficient documentation

## 2014-02-28 DIAGNOSIS — R059 Cough, unspecified: Secondary | ICD-10-CM

## 2014-04-10 ENCOUNTER — Other Ambulatory Visit (HOSPITAL_COMMUNITY): Payer: Self-pay | Admitting: Internal Medicine

## 2014-04-10 DIAGNOSIS — M5416 Radiculopathy, lumbar region: Secondary | ICD-10-CM

## 2014-04-18 ENCOUNTER — Ambulatory Visit (HOSPITAL_COMMUNITY)
Admission: RE | Admit: 2014-04-18 | Discharge: 2014-04-18 | Disposition: A | Discharge status: Home / Self Care | Payer: BLUE CROSS/BLUE SHIELD | Source: Ambulatory Visit | Attending: Internal Medicine | Admitting: Internal Medicine

## 2014-04-18 ENCOUNTER — Ambulatory Visit (HOSPITAL_COMMUNITY): Admission: RE | Admit: 2014-04-18 | Payer: BLUE CROSS/BLUE SHIELD | Source: Ambulatory Visit

## 2014-04-18 DIAGNOSIS — M5416 Radiculopathy, lumbar region: Secondary | ICD-10-CM | POA: Diagnosis not present

## 2014-04-24 ENCOUNTER — Other Ambulatory Visit (HOSPITAL_COMMUNITY): Payer: BLUE CROSS/BLUE SHIELD

## 2014-06-13 ENCOUNTER — Ambulatory Visit: Payer: BLUE CROSS/BLUE SHIELD | Admitting: Cardiovascular Disease

## 2014-07-04 ENCOUNTER — Ambulatory Visit: Payer: BLUE CROSS/BLUE SHIELD | Admitting: Cardiovascular Disease

## 2014-07-04 ENCOUNTER — Encounter: Payer: Self-pay | Admitting: Cardiovascular Disease

## 2014-09-19 ENCOUNTER — Ambulatory Visit: Payer: BLUE CROSS/BLUE SHIELD | Admitting: Cardiovascular Disease

## 2014-09-26 ENCOUNTER — Ambulatory Visit: Payer: BLUE CROSS/BLUE SHIELD | Admitting: Cardiovascular Disease

## 2014-12-03 ENCOUNTER — Ambulatory Visit: Payer: BLUE CROSS/BLUE SHIELD | Admitting: Cardiovascular Disease

## 2014-12-03 ENCOUNTER — Encounter: Payer: Self-pay | Admitting: Cardiovascular Disease

## 2014-12-03 DIAGNOSIS — R0989 Other specified symptoms and signs involving the circulatory and respiratory systems: Secondary | ICD-10-CM

## 2015-03-13 ENCOUNTER — Encounter: Payer: Self-pay | Admitting: *Deleted

## 2015-03-13 ENCOUNTER — Ambulatory Visit (INDEPENDENT_AMBULATORY_CARE_PROVIDER_SITE_OTHER): Payer: BLUE CROSS/BLUE SHIELD | Admitting: Cardiovascular Disease

## 2015-03-13 ENCOUNTER — Encounter: Payer: Self-pay | Admitting: Cardiovascular Disease

## 2015-03-13 VITALS — BP 118/90 | HR 76 | Ht 63.0 in | Wt 273.0 lb

## 2015-03-13 DIAGNOSIS — I10 Essential (primary) hypertension: Secondary | ICD-10-CM | POA: Diagnosis not present

## 2015-03-13 DIAGNOSIS — R002 Palpitations: Secondary | ICD-10-CM

## 2015-03-13 DIAGNOSIS — M069 Rheumatoid arthritis, unspecified: Secondary | ICD-10-CM

## 2015-03-13 DIAGNOSIS — R0602 Shortness of breath: Secondary | ICD-10-CM

## 2015-03-13 DIAGNOSIS — R5383 Other fatigue: Secondary | ICD-10-CM

## 2015-03-13 DIAGNOSIS — R000 Tachycardia, unspecified: Secondary | ICD-10-CM

## 2015-03-13 NOTE — Patient Instructions (Signed)
Your physician recommends that you continue on your current medications as directed. Please refer to the Current Medication list given to you today. Your physician has requested that you have an echocardiogram. Echocardiography is a painless test that uses sound waves to create images of your heart. It provides your doctor with information about the size and shape of your heart and how well your heart's chambers and valves are working. This procedure takes approximately one hour. There are no restrictions for this procedure. Your physician has requested that you have a lexiscan myoview. For further information please visit [email or web address] Please follow instruction sheet, as given.  You physician has requested that you have a spirometry test. Your physician recommends that you schedule a follow-up appointment in: 2 months.

## 2015-03-13 NOTE — Progress Notes (Signed)
Patient ID: Allison Atkinson, female   DOB: 09/05/1975, 40 y.o.   MRN: [id number]      SUBJECTIVE: The patient is here to followup for her tachycardia and palpitations.  ECG today shows sinus rhythm with artifact and a nonspecific T wave abnormality.  Echocardiogram on 04/24/13 showed vigorous left ventricular systolic function, EF 65-70%, mild LVH, and grade 1 diastolic dysfunction.  She has been more short of breath over the past 2 weeks, primarily when lying down. Denies exertional chest pain and dyspnea. May have had an episode of paroxysmal nocturnal dyspnea. Has noticed progressive fatigue over the past year. Has rheumatoid arthritis.   Review of Systems: As per "subjective", otherwise negative.  Allergies  Allergen Reactions  . Oxycodone-Acetaminophen Nausea And Vomiting    Tylox - GI   . Latex Rash    Raised red rash at areas of contact    Current Outpatient Prescriptions  Medication Sig Dispense Refill  . adalimumab (HUMIRA) 40 MG/0.8ML injection Inject 0.8 mLs (40 mg total) into the skin every 14 (fourteen) days. Usual Dosing is Wed    . b complex vitamins tablet Take 1 tablet by mouth daily.    . celecoxib (CELEBREX) 200 MG capsule Take 1 capsule (200 mg total) by mouth 2 (two) times daily.    . Cholecalciferol (VITAMIN D3) 3000 UNITS TABS Take 1 tablet by mouth daily.    . clonazePAM (KLONOPIN) 1 MG tablet Take 1 tablet (1 mg total) by mouth 3 (three) times daily as needed for anxiety.    Marland Kitchen diltiazem (CARDIZEM CD) 180 MG 24 hr capsule Take 1 capsule by mouth 2 (two) times daily.     Marland Kitchen esomeprazole (NEXIUM) 40 MG capsule Take 1 capsule (40 mg total) by mouth daily before breakfast.    . levothyroxine (SYNTHROID, LEVOTHROID) 175 MCG tablet Take 1 tablet (175 mcg total) by mouth daily.    . metFORMIN (GLUCOPHAGE) 500 MG tablet Take 500 mg by mouth 2 (two) times daily with a meal.     . methocarbamol (ROBAXIN) 500 MG tablet Take 500 mg by mouth every 6 (six) hours as needed  for muscle spasms.    . metoprolol tartrate (LOPRESSOR) 25 MG tablet TAKE (1) TABLET TWICE DAILY. 60 tablet 6  . sertraline (ZOLOFT) 100 MG tablet Take 150 mg by mouth daily.    . traMADol (ULTRAM) 50 MG tablet Take 50 mg by mouth every 6 (six) hours as needed.    Marland Kitchen VITAMIN E PO Take 1 capsule by mouth daily.     No current facility-administered medications for this visit.    Past Medical History  Diagnosis Date  . History of cholecystectomy   . Thyroid disease     hypothyroid  . Fatty liver disease, nonalcoholic   . Polycystic ovarian disease   . RA (rheumatoid arthritis) (HCC)   . Bipolar disorder (HCC)   . Anxiety   . Hypertension     Past Surgical History  Procedure Laterality Date  . Cholecystectomy  1999  . Knee arthroscopy Right   . Colonoscopy w/ biopsies  2010  . Esophagogastroduodenoscopy  2010    Social History   Social History  . Marital Status: Married    Spouse Name: N/A  . Number of Children: N/A  . Years of Education: N/A   Occupational History  . Not on file.   Social History Main Topics  . Smoking status: Never Smoker   . Smokeless tobacco: Never Used  . Alcohol Use: Yes  Comment: no longer drinks alcohol  . Drug Use: No  . Sexual Activity: Yes   Other Topics Concern  . Not on file   Social History Narrative     Filed Vitals:   03/13/15 1528  BP: 118/90  Pulse: 76  Height: 5\' 3"  (1.6 m)  Weight: 273 lb (123.832 kg)  SpO2: 96%    PHYSICAL EXAM General: NAD HEENT: Normal. Neck: No JVD, no thyromegaly. Lungs: Clear to auscultation bilaterally with normal respiratory effort. CV: Nondisplaced PMI.  Regular rate and rhythm, normal S1/S2, no S3/S4, no murmur. No pretibial or periankle edema.   Abdomen: Obese. Neurologic: Alert and oriented.  Psych: Normal affect. Skin: Normal. Musculoskeletal: No gross deformities.  ECG: Most recent ECG reviewed.      ASSESSMENT AND PLAN: 1. Tachycardia and palpitations: On diltiazem  180 mg and metoprolol. Again, I suspect that the etiology of this may very well be due to the adalimumab (Humira) she is taking for rheumatoid arthritis, as this can lead to both tachycardia and palpitations in approximately 1-5% of individuals.  2. Shortness of breath and fatigue: CV risk factors include autoimmune disease including diabetes and rheumatoid arthritis. She has back and knee problems. Will obtain a Lexiscan Cardiolite stress test to evaluate for occult ischemic heart disease. Will obtain an echocardiogram to evaluate cardiac structure and function and to assess for interval changes since 2015. Will also obtain spirometry given her history of rheumatoid arthritis which can lead to rheumatoid lung disease.  Dispo: f/u 2 months.  2016, M.D., F.A.C.C.

## 2015-03-19 ENCOUNTER — Encounter: Payer: Self-pay | Admitting: *Deleted

## 2015-03-19 ENCOUNTER — Other Ambulatory Visit: Payer: Self-pay

## 2015-03-19 ENCOUNTER — Ambulatory Visit (INDEPENDENT_AMBULATORY_CARE_PROVIDER_SITE_OTHER): Payer: BLUE CROSS/BLUE SHIELD

## 2015-03-19 DIAGNOSIS — R5383 Other fatigue: Secondary | ICD-10-CM

## 2015-03-19 DIAGNOSIS — R0602 Shortness of breath: Secondary | ICD-10-CM | POA: Diagnosis not present

## 2015-03-25 ENCOUNTER — Inpatient Hospital Stay (HOSPITAL_COMMUNITY): Admission: RE | Admit: 2015-03-25 | Payer: BLUE CROSS/BLUE SHIELD | Source: Ambulatory Visit

## 2015-03-25 ENCOUNTER — Encounter (HOSPITAL_COMMUNITY): Payer: BLUE CROSS/BLUE SHIELD

## 2015-03-25 ENCOUNTER — Ambulatory Visit (HOSPITAL_COMMUNITY): Payer: BLUE CROSS/BLUE SHIELD | Attending: Cardiovascular Disease

## 2015-03-30 ENCOUNTER — Ambulatory Visit (HOSPITAL_COMMUNITY): Payer: BLUE CROSS/BLUE SHIELD

## 2015-03-30 ENCOUNTER — Encounter (HOSPITAL_COMMUNITY)
Admission: RE | Admit: 2015-03-30 | Discharge: 2015-03-30 | Disposition: A | Discharge status: Home / Self Care | Payer: BLUE CROSS/BLUE SHIELD | Source: Ambulatory Visit | Attending: Cardiovascular Disease | Admitting: Cardiovascular Disease

## 2015-03-30 ENCOUNTER — Encounter (HOSPITAL_COMMUNITY): Payer: Self-pay

## 2015-03-30 DIAGNOSIS — R5383 Other fatigue: Secondary | ICD-10-CM | POA: Diagnosis not present

## 2015-03-30 DIAGNOSIS — R0602 Shortness of breath: Secondary | ICD-10-CM | POA: Insufficient documentation

## 2015-03-30 LAB — NM MYOCAR MULTI W/SPECT W/WALL MOTION / EF
CHL CUP NUCLEAR SDS: 0
CHL CUP NUCLEAR SRS: 10
LHR: 0.24
LV dias vol: 70 mL
LV sys vol: 20 mL
Peak HR: 108 {beats}/min
Rest HR: 85 {beats}/min
SSS: 10
TID: 1.12

## 2015-03-30 MED ORDER — SODIUM CHLORIDE 0.9% FLUSH
INTRAVENOUS | Status: AC
  Administered 2015-03-30: 10 mL via INTRAVENOUS
  Filled 2015-03-30: qty 10

## 2015-03-30 MED ORDER — TECHNETIUM TC 99M SESTAMIBI GENERIC - CARDIOLITE
10.0000 | Freq: Once | INTRAVENOUS | Status: AC | PRN
  Administered 2015-03-30: 10 via INTRAVENOUS

## 2015-03-30 MED ORDER — REGADENOSON 0.4 MG/5ML IV SOLN
INTRAVENOUS | Status: AC
  Administered 2015-03-30: 0.4 mg via INTRAVENOUS
  Filled 2015-03-30: qty 5

## 2015-03-30 MED ORDER — TECHNETIUM TC 99M SESTAMIBI - CARDIOLITE
30.0000 | Freq: Once | INTRAVENOUS | Status: AC | PRN
  Administered 2015-03-30: 30 via INTRAVENOUS

## 2015-03-31 ENCOUNTER — Encounter: Payer: Self-pay | Admitting: *Deleted

## 2015-04-03 ENCOUNTER — Other Ambulatory Visit: Payer: Self-pay | Admitting: Cardiovascular Disease

## 2015-04-20 ENCOUNTER — Telehealth: Payer: Self-pay

## 2015-04-20 ENCOUNTER — Institutional Professional Consult (permissible substitution): Payer: BLUE CROSS/BLUE SHIELD | Admitting: Neurology

## 2015-04-20 NOTE — Telephone Encounter (Signed)
Pt cancelled same day appt because of sickness.

## 2015-04-28 ENCOUNTER — Telehealth: Payer: Self-pay

## 2015-04-28 ENCOUNTER — Institutional Professional Consult (permissible substitution): Payer: BLUE CROSS/BLUE SHIELD | Admitting: Neurology

## 2015-04-28 NOTE — Telephone Encounter (Signed)
Pt has no showed twice for a new patient appointment.  04/20/2015 same day cancellation for sickness  04/28/2015 no-show

## 2015-04-28 NOTE — Telephone Encounter (Signed)
Do not reschedule, if  patient was not yet seen in sleep clinic and no showed twice, we have no patient/   doctor relationship.   I will send Dr Ouida Sills this as a FYI>   Offer to follow up at another neurology office. Gedalya Jim, MD

## 2015-04-28 NOTE — Telephone Encounter (Signed)
Pt did not show for their appt with Dr. Dohmeier today.  

## 2015-04-29 ENCOUNTER — Encounter: Payer: Self-pay | Admitting: Neurology

## 2015-05-14 ENCOUNTER — Ambulatory Visit: Payer: BLUE CROSS/BLUE SHIELD | Admitting: Cardiovascular Disease

## 2015-06-03 ENCOUNTER — Ambulatory Visit: Payer: BLUE CROSS/BLUE SHIELD | Admitting: Cardiovascular Disease

## 2015-06-05 ENCOUNTER — Encounter: Payer: Self-pay | Admitting: Cardiovascular Disease

## 2015-06-05 ENCOUNTER — Ambulatory Visit (INDEPENDENT_AMBULATORY_CARE_PROVIDER_SITE_OTHER): Payer: BLUE CROSS/BLUE SHIELD | Admitting: Cardiovascular Disease

## 2015-06-05 VITALS — BP 115/75 | HR 80 | Ht 63.0 in | Wt 273.0 lb

## 2015-06-05 DIAGNOSIS — IMO0001 Reserved for inherently not codable concepts without codable children: Secondary | ICD-10-CM

## 2015-06-05 DIAGNOSIS — R5383 Other fatigue: Secondary | ICD-10-CM

## 2015-06-05 DIAGNOSIS — M069 Rheumatoid arthritis, unspecified: Secondary | ICD-10-CM

## 2015-06-05 DIAGNOSIS — I10 Essential (primary) hypertension: Secondary | ICD-10-CM

## 2015-06-05 DIAGNOSIS — R0602 Shortness of breath: Secondary | ICD-10-CM

## 2015-06-05 DIAGNOSIS — R000 Tachycardia, unspecified: Secondary | ICD-10-CM

## 2015-06-05 DIAGNOSIS — Z136 Encounter for screening for cardiovascular disorders: Secondary | ICD-10-CM

## 2015-06-05 DIAGNOSIS — R002 Palpitations: Secondary | ICD-10-CM

## 2015-06-05 NOTE — Patient Instructions (Signed)
Continue all current medications. Your physician wants you to follow up in:  1 year.  You will receive a reminder letter in the mail one-two months in advance.  If you don't receive a letter, please call our office to schedule the follow up appointment   

## 2015-06-05 NOTE — Progress Notes (Signed)
Patient ID: Allison Atkinson, female   DOB: Jan 14, 1976, 40 y.o.   MRN: [id number]      SUBJECTIVE: The patient returns for follow-up after undergoing cardiovascular testing performed for the evaluation of shortness of breath and fatigue. Nuclear stress test 03/30/15 was normal. Echocardiogram 03/19/15 was normal, LVEF 65-70%. She has yet to have spirometry performed.  She had some of her anxiety medications adjusted by her PCP and she feels that some of her symptoms of shortness of breath and fatigue have improved to some degree. Denies palpitations.  Soc: Former Therapist, music at [employer].   Review of Systems: As per "subjective", otherwise negative.  Allergies  Allergen Reactions  . Oxycodone-Acetaminophen Nausea And Vomiting    Tylox - GI   . Latex Rash    Raised red rash at areas of contact    Current Outpatient Prescriptions  Medication Sig Dispense Refill  . adalimumab (HUMIRA) 40 MG/0.8ML injection Inject 0.8 mLs (40 mg total) into the skin every 14 (fourteen) days. Usual Dosing is Wed    . b complex vitamins tablet Take 1 tablet by mouth daily.    . celecoxib (CELEBREX) 200 MG capsule Take 1 capsule (200 mg total) by mouth 2 (two) times daily.    . Cholecalciferol (VITAMIN D3) 3000 UNITS TABS Take 1 tablet by mouth daily.    . clonazePAM (KLONOPIN) 1 MG tablet Take 1 tablet (1 mg total) by mouth 3 (three) times daily as needed for anxiety.    Marland Kitchen diltiazem (CARDIZEM CD) 180 MG 24 hr capsule Take 1 capsule by mouth 2 (two) times daily.     Marland Kitchen esomeprazole (NEXIUM) 40 MG capsule Take 1 capsule (40 mg total) by mouth daily before breakfast.    . metFORMIN (GLUCOPHAGE) 500 MG tablet Take 500 mg by mouth 2 (two) times daily with a meal.     . methocarbamol (ROBAXIN) 500 MG tablet Take 500 mg by mouth every 6 (six) hours as needed for muscle spasms.    . metoprolol tartrate (LOPRESSOR) 25 MG tablet TAKE (1) TABLET TWICE DAILY. 60 tablet 6  . sertraline (ZOLOFT) 100  MG tablet Take 150 mg by mouth daily.    . traMADol (ULTRAM) 50 MG tablet Take 50 mg by mouth every 6 (six) hours as needed.    Marland Kitchen VITAMIN E PO Take 1 capsule by mouth daily.     No current facility-administered medications for this visit.    Past Medical History  Diagnosis Date  . History of cholecystectomy   . Thyroid disease     hypothyroid  . Fatty liver disease, nonalcoholic   . Polycystic ovarian disease   . RA (rheumatoid arthritis) (HCC)   . Bipolar disorder (HCC)   . Anxiety   . Hypertension     Past Surgical History  Procedure Laterality Date  . Cholecystectomy  1999  . Knee arthroscopy Right   . Colonoscopy w/ biopsies  2010  . Esophagogastroduodenoscopy  2010    Social History   Social History  . Marital Status: Married    Spouse Name: N/A  . Number of Children: N/A  . Years of Education: N/A   Occupational History  . Not on file.   Social History Main Topics  . Smoking status: Never Smoker   . Smokeless tobacco: Never Used  . Alcohol Use: 0.0 oz/week    0 Standard drinks or equivalent per week     Comment: no longer drinks alcohol  . Drug Use: No  .  Sexual Activity: Yes   Other Topics Concern  . Not on file   Social History Narrative     Filed Vitals:   06/05/15 1619  Height: 5\' 3"  (1.6 m)  Weight: 273 lb (123.832 kg)    PHYSICAL EXAM General: NAD HEENT: Normal. Neck: No JVD, no thyromegaly. Lungs: Clear to auscultation bilaterally with normal respiratory effort. CV: Nondisplaced PMI. Regular rate and rhythm, normal S1/S2, no S3/S4, no murmur. No pretibial or periankle edema.  Abdomen: Obese. Neurologic: Alert and oriented.  Psych: Normal affect. Skin: Normal. Musculoskeletal: No gross deformities.   ECG: Most recent ECG reviewed.      ASSESSMENT AND PLAN: 1. Tachycardia and palpitations: On diltiazem 180 mg and metoprolol. Again, I suspect that the etiology of this may very well be due to the adalimumab (Humira) she is  taking for rheumatoid arthritis, as this can lead to both tachycardia and palpitations in approximately 1-5% of individuals.  2. Shortness of breath and fatigue: Symptoms have improved to some degree after anxiety medication adjustments by PCP. Lexiscan Cardiolite stress test and echocardiogram were both normal.  I previously ordered spirometry given her history of rheumatoid arthritis which can lead to rheumatoid lung disease, but she has not had this completed yet.  Dispo: f/u 1 year.   , M.D., F.A.C.C.

## 2015-11-27 ENCOUNTER — Telehealth: Payer: Self-pay | Admitting: Rheumatology

## 2015-11-27 NOTE — Telephone Encounter (Signed)
Patient would like to know if we have received approval for knee injections. She was told she could apply in October and would like to know if we have heard anything yet.

## 2015-12-11 ENCOUNTER — Telehealth: Payer: Self-pay | Admitting: Rheumatology

## 2015-12-11 NOTE — Telephone Encounter (Signed)
Patient called to see if Euflexxa injections have been approved yet?

## 2015-12-22 ENCOUNTER — Ambulatory Visit: Payer: BLUE CROSS/BLUE SHIELD | Admitting: Rheumatology

## 2015-12-29 ENCOUNTER — Ambulatory Visit: Payer: BLUE CROSS/BLUE SHIELD | Admitting: Rheumatology

## 2016-01-05 ENCOUNTER — Other Ambulatory Visit: Payer: Self-pay | Admitting: Rheumatology

## 2016-01-05 LAB — CBC WITH DIFFERENTIAL/PLATELET
BASOS PCT: 1 %
Basophils Absolute: 102 cells/uL (ref 0–200)
EOS ABS: 612 {cells}/uL — AB (ref 15–500)
Eosinophils Relative: 6 %
HCT: 39.6 % (ref 35.0–45.0)
HEMOGLOBIN: 13.3 g/dL (ref 11.7–15.5)
LYMPHS ABS: 3774 {cells}/uL (ref 850–3900)
Lymphocytes Relative: 37 %
MCH: 29.8 pg (ref 27.0–33.0)
MCHC: 33.6 g/dL (ref 32.0–36.0)
MCV: 88.8 fL (ref 80.0–100.0)
MONOS PCT: 6 %
MPV: 10 fL (ref 7.5–12.5)
Monocytes Absolute: 612 cells/uL (ref 200–950)
NEUTROS ABS: 5100 {cells}/uL (ref 1500–7800)
Neutrophils Relative %: 50 %
PLATELETS: 337 10*3/uL (ref 140–400)
RBC: 4.46 MIL/uL (ref 3.80–5.10)
RDW: 14.3 % (ref 11.0–15.0)
WBC: 10.2 10*3/uL (ref 3.8–10.8)

## 2016-01-06 LAB — COMPLETE METABOLIC PANEL WITH GFR
ALT: 30 U/L — ABNORMAL HIGH (ref 6–29)
AST: 28 U/L (ref 10–30)
Albumin: 4 g/dL (ref 3.6–5.1)
Alkaline Phosphatase: 57 U/L (ref 33–115)
BUN: 5 mg/dL — ABNORMAL LOW (ref 7–25)
CHLORIDE: 102 mmol/L (ref 98–110)
CO2: 25 mmol/L (ref 20–31)
Calcium: 8.9 mg/dL (ref 8.6–10.2)
Creat: 0.66 mg/dL (ref 0.50–1.10)
Glucose, Bld: 165 mg/dL — ABNORMAL HIGH (ref 65–99)
POTASSIUM: 4 mmol/L (ref 3.5–5.3)
Sodium: 137 mmol/L (ref 135–146)
TOTAL PROTEIN: 6.5 g/dL (ref 6.1–8.1)
Total Bilirubin: 0.3 mg/dL (ref 0.2–1.2)

## 2016-01-07 LAB — QUANTIFERON TB GOLD ASSAY (BLOOD)
Interferon Gamma Release Assay: NEGATIVE
Quantiferon Nil Value: 0.01 IU/mL
Quantiferon Tb Ag Minus Nil Value: 0.01 IU/mL

## 2016-01-07 NOTE — Progress Notes (Signed)
Labs normal.

## 2016-01-12 DIAGNOSIS — M170 Bilateral primary osteoarthritis of knee: Secondary | ICD-10-CM | POA: Insufficient documentation

## 2016-01-12 DIAGNOSIS — K529 Noninfective gastroenteritis and colitis, unspecified: Secondary | ICD-10-CM | POA: Insufficient documentation

## 2016-01-12 DIAGNOSIS — Z8719 Personal history of other diseases of the digestive system: Secondary | ICD-10-CM | POA: Insufficient documentation

## 2016-01-12 DIAGNOSIS — M47816 Spondylosis without myelopathy or radiculopathy, lumbar region: Secondary | ICD-10-CM | POA: Insufficient documentation

## 2016-01-12 DIAGNOSIS — M461 Sacroiliitis, not elsewhere classified: Secondary | ICD-10-CM | POA: Insufficient documentation

## 2016-01-12 NOTE — Progress Notes (Deleted)
Office Visit Note  Patient: Allison Atkinson             Date of Birth: 1975/11/07           MRN: [id number]             PCP: Asencion Noble, MD Referring: Asencion Noble, MD Visit Date: 01/13/2016 Occupation: @GUAROCC @    Subjective:  No chief complaint on file.   History of Present Illness: Allison Atkinson is a 40 y.o. female ***   Activities of Daily Living:  Patient reports morning stiffness for *** {minute/hour:19697}.   Patient {ACTIONS;DENIES/REPORTS:21021675::"Denies"} nocturnal pain.  Difficulty dressing/grooming: {ACTIONS;DENIES/REPORTS:21021675::"Denies"} Difficulty climbing stairs: {ACTIONS;DENIES/REPORTS:21021675::"Denies"} Difficulty getting out of chair: {ACTIONS;DENIES/REPORTS:21021675::"Denies"} Difficulty using hands for taps, buttons, cutlery, and/or writing: {ACTIONS;DENIES/REPORTS:21021675::"Denies"}   No Rheumatology ROS completed.   PMFS History:  Patient Active Problem List   Diagnosis Date Noted  . Primary osteoarthritis of both knees 01/12/2016  . Spondylosis of lumbar region without myelopathy or radiculopathy 01/12/2016  . Sacroiliitis, not elsewhere classified ([place]) 01/12/2016  . Chronic diarrhea 01/12/2016  . History of gastroesophageal reflux (GERD) 01/12/2016  . History of fatty infiltration of liver 01/12/2016  . Hypertension 05/09/2013  . Palpitations 04/11/2013  . Spondyloarthropathy ([place]) 10/24/2009  . PCOS (polycystic ovarian syndrome) 01/25/2003  . Depression with anxiety 01/25/2003  . GERD (gastroesophageal reflux disease) 01/25/2003  . Hypothyroidism 01/24/2001    Past Medical History:  Diagnosis Date  . Anxiety   . Bipolar disorder ([place])   . Fatty liver disease, nonalcoholic   . History of cholecystectomy   . Hypertension   . Polycystic ovarian disease   . RA (rheumatoid arthritis) ([place])   . Thyroid disease    hypothyroid    Family History  Problem Relation Age of Onset  . Hypertension Mother   . Alcohol abuse Father     Past Surgical History:  Procedure Laterality Date  . CHOLECYSTECTOMY  1999  . COLONOSCOPY W/ BIOPSIES  2010  . ESOPHAGOGASTRODUODENOSCOPY  2010  . KNEE ARTHROSCOPY Right    Social History   Social History Narrative  . No narrative on file     Objective: Vital Signs: There were no vitals taken for this visit.   Physical Exam   Musculoskeletal Exam: ***  CDAI Exam: No CDAI exam completed.    Investigation: Findings:  Labs from July 2010 showed rheumatoid factor normal, ANA negative, and HLA-B27 negative.  Biopsy showed moderate chronic synovitis, but no active inflammation Her labs from October 15, 2008, sed rate, hepatitis panel, uric acid, CK, anti-CCP, ACE level, and vitamin D were normal.  Rheumatoid factor and ANA were negative.    Orders Only on 01/05/2016  Component Date Value Ref Range Status  . Sodium 01/06/2016 137  135 - 146 mmol/L Final  . Potassium 01/06/2016 4.0  3.5 - 5.3 mmol/L Final  . Chloride 01/06/2016 102  98 - 110 mmol/L Final  . CO2 01/06/2016 25  20 - 31 mmol/L Final  . Glucose, Bld 01/06/2016 165* 65 - 99 mg/dL Final  . BUN 01/06/2016 5* 7 - 25 mg/dL Final  . Creat 01/06/2016 0.66  0.50 - 1.10 mg/dL Final  . Total Bilirubin 01/06/2016 0.3  0.2 - 1.2 mg/dL Final  . Alkaline Phosphatase 01/06/2016 57  33 - 115 U/L Final  . AST 01/06/2016 28  10 - 30 U/L Final  . ALT 01/06/2016 30* 6 - 29 U/L Final  . Total Protein 01/06/2016 6.5  6.1 - 8.1 g/dL Final  .  Albumin 01/06/2016 4.0  3.6 - 5.1 g/dL Final  . Calcium 01/06/2016 8.9  8.6 - 10.2 mg/dL Final  . GFR, Est African American 01/06/2016 >89  >=60 mL/min Final  . GFR, Est Non African American 01/06/2016 >89  >=60 mL/min Final  . WBC 01/05/2016 10.2  3.8 - 10.8 K/uL Final  . RBC 01/05/2016 4.46  3.80 - 5.10 MIL/uL Final  . Hemoglobin 01/05/2016 13.3  11.7 - 15.5 g/dL Final  . HCT 01/05/2016 39.6  35.0 - 45.0 % Final  . MCV 01/05/2016 88.8  80.0 - 100.0 fL Final  . MCH 01/05/2016 29.8  27.0  - 33.0 pg Final  . MCHC 01/05/2016 33.6  32.0 - 36.0 g/dL Final  . RDW 01/05/2016 14.3  11.0 - 15.0 % Final  . Platelets 01/05/2016 337  140 - 400 K/uL Final  . MPV 01/05/2016 10.0  7.5 - 12.5 fL Final  . Neutro Abs 01/05/2016 5100  1,500 - 7,800 cells/uL Final  . Lymphs Abs 01/05/2016 3774  850 - 3,900 cells/uL Final  . Monocytes Absolute 01/05/2016 612  200 - 950 cells/uL Final  . Eosinophils Absolute 01/05/2016 612* 15 - 500 cells/uL Final  . Basophils Absolute 01/05/2016 102  0 - 200 cells/uL Final  . Neutrophils Relative % 01/05/2016 50  % Final  . Lymphocytes Relative 01/05/2016 37  % Final  . Monocytes Relative 01/05/2016 6  % Final  . Eosinophils Relative 01/05/2016 6  % Final  . Basophils Relative 01/05/2016 1  % Final  . Smear Review 01/05/2016 Criteria for review not met   Final  . Interferon Gamma Release Assay 01/07/2016 NEGATIVE  NEGATIVE Final  . Quantiferon Nil Value 01/07/2016 0.01  IU/mL Final  . Mitogen-Nil 01/07/2016 >10.00  IU/mL Final  . Quantiferon Tb Ag Minus Nil Value 01/07/2016 0.01  IU/mL Final   Comment:   The Nil tube value is used to determine if the patient has a preexisting immune response which could cause a false-positive reading on the test. In order for a test to be valid, the Nil tube must have a value of less than or equal to 8.0 IU/mL.   The mitogen control tube is used to assure the patient has a healthy immune status and also serves as a control for correct blood handling and incubation. It is used to detect false-negative readings. The mitogen tube must have a gamma interferon value of greater than or equal to 0.5 IU/mL higher than the value of the Nil tube.   The TB antigen tube is coated with the M. tuberculosis specific antigens. For a test to be considered positive, the TB antigen tube value minus the Nil tube value must be greater than or equal to 0.35 IU/mL.   For additional information, please refer  to http://education.questdiagnostics.com/faq/QFT (This link is being provided for informational/educational purposes only.)     Imaging: No results found.  Speciality Comments: No specialty comments available.    Procedures:  No procedures performed Allergies: Oxycodone-acetaminophen and Latex   Assessment / Plan:     Visit Diagnoses: Spondyloarthropathy ([place])  Primary osteoarthritis of both knees  Spondylosis of lumbar region without myelopathy or radiculopathy  Sacroiliitis, not elsewhere classified (HCC)  Chronic diarrhea  History of gastroesophageal reflux (GERD)  History of fatty infiltration of liver    Orders: No orders of the defined types were placed in this encounter.  No orders of the defined types were placed in this encounter.   Face-to-face time spent with patient  was *** minutes. 50% of time was spent in counseling and coordination of care.  Follow-Up Instructions: No Follow-up on file.   Katrell Milhorn, RT

## 2016-01-13 ENCOUNTER — Ambulatory Visit: Payer: BLUE CROSS/BLUE SHIELD | Admitting: Rheumatology

## 2016-02-04 ENCOUNTER — Telehealth: Payer: Self-pay | Admitting: Pharmacist

## 2016-02-04 NOTE — Telephone Encounter (Signed)
Received prescription request from [employer] patient assistance program.    Last visit: 07/14/15 Next visit: 02/16/16 Labs: 01/05/16 CBC normal, CMP normal except glucose 165, ALT 30; TB Gold negative  Okay to refill Humira per Dr. Corliss Skains.  Form was signed by Dr. Corliss Skains and faxed to [phone].   I called patient and informed her the prescription has been sent.  Patient voiced understanding.     Lilla Shook, Pharm.D., BCPS, CPP Clinical Pharmacist Pager: [phone] Phone: [phone] 02/04/2016 12:47 PM

## 2016-02-16 ENCOUNTER — Ambulatory Visit: Payer: BLUE CROSS/BLUE SHIELD | Admitting: Rheumatology

## 2016-02-17 ENCOUNTER — Telehealth: Payer: Self-pay

## 2016-02-17 NOTE — Telephone Encounter (Addendum)
Received an application from [employer] Patient Assistance for Ms. Allison Atkinson. Called patient to see if she already filled out her part or did we need to start it for her? She will have have to sign and provide proof of income before her part can be submitted.   Spoke with Allison Atkinson form [employer] who states that the patient's eligibility will expire on 04/08/16. She will need to submit a new application and the clinic will need to send a new RX as well.   Diahn Waidelich, [place], CPhT   Spoke with Allison Atkinson She has mailed her application.   She also states that she is interested in starting the process of getting Euflexxa injections. Her next appointment in on 03/30/16. I told her that I would the nurse know and to contact her if they had any questions or updates on what she needs to do. Is this something you can do Sue Lush? Thanks  Abran Duke, CPhT 9:27 AM 03/09/16

## 2016-02-29 ENCOUNTER — Ambulatory Visit: Payer: BLUE CROSS/BLUE SHIELD | Admitting: Rheumatology

## 2016-03-01 ENCOUNTER — Other Ambulatory Visit: Payer: Self-pay | Admitting: Rheumatology

## 2016-03-01 DIAGNOSIS — M069 Rheumatoid arthritis, unspecified: Secondary | ICD-10-CM

## 2016-03-01 NOTE — Telephone Encounter (Signed)
Last visit: 07/14/15 Next visit: 03/30/16 Labs: 01/05/16 CBC normal, CMP normal except glucose 165, ALT 30  Okay to refill Celebreax?

## 2016-03-09 ENCOUNTER — Telehealth: Payer: Self-pay

## 2016-03-09 NOTE — Telephone Encounter (Signed)
I have faxed form to Humira patient assistance.  I called patient to inform her and to discuss her blood sugar.  Left a message asking her to call me back.

## 2016-03-09 NOTE — Telephone Encounter (Signed)
Received return call from patient.  Informed her the Humira provider form was submitted.  Will update her once we know status of the application.    Also discussed her elevated blood sugar.  Patient confirms she saw her endocrinologist after those labs were drawn and her elevated blood sugar was addressed.     Lilla Shook, Pharm.D., BCPS, CPP Clinical Pharmacist Pager: [phone] Phone: [phone] 03/09/2016 3:48 PM

## 2016-03-09 NOTE — Telephone Encounter (Signed)
Received a fax from [employer] patient assistance foundation regarding an application for Allison Atkinson. This is our second request. There was an RX sent to the foundation in January.   Spoke to Allison Atkinson who states that she sent her application by mail around January 31st/Feburary 1st.   Spoke with Arti, a representative from [place] who states that they received the application from Allison Atkinson. They need the prescriber prescription and certification form completed and faxed. This form is required for a renewal application.   Fleet Contras, can you complete the Rx and certification form? Thanks  [place], [place], CPhT

## 2016-03-09 NOTE — Telephone Encounter (Signed)
Form has been signed as requested.Okay to send a prescription for HumiraPatient's labs are normal with nonfasting glucose elevated at 165 (she will need to discuss with PCP if there is concern for diabetes).

## 2016-03-09 NOTE — Telephone Encounter (Signed)
Prescribed prescription and certification needed to re-enroll patient in the Humira patient assistance program.   Last visit: 07/14/15 Next visit: 03/30/16 Labs: 01/05/16 CBC normal, CMP normal except for glucose 165, ALT 30; TB Gold negative  Okay to re-enroll patient in the Humira patient assistance program?  Form was placed on your desk.     Lilla Shook, Pharm.D., BCPS, CPP Clinical Pharmacist Pager: [phone] Phone: [phone] 03/09/2016 10:29 AM

## 2016-03-16 ENCOUNTER — Encounter: Payer: Self-pay | Admitting: Pharmacist

## 2016-03-16 ENCOUNTER — Telehealth: Payer: Self-pay

## 2016-03-16 NOTE — Telephone Encounter (Signed)
Received fax from [place] patient assistance regarding Allison Atkinson's application. She has been denied coverage because she has insurance coverage [facility]). It states that her insurance will require a prior authorization and her plan includes an out of pocket maximum of $800. The medication can be filled at [facility] [phone]) which is the insurance company's mandated specialty pharmacy. Once satisfied, her medication will be covered at %100 for the remainder of the benefit period.  Phone: [phone]  Will send document to scan center.   Spoke with Allison Atkinson and updated her about the decision. She states that they did the same thing last year. She also states that she was able to get it covered in the past  because there was not enough coverage through her  insurance for the cost of the medication. A prior authorization was submitted and we will update her when we get a response. I let her know that if her insurance does cover the medication, that she would be able to use manufacture coup os to help with the cost. Patient voiced understanding. If it does not cover the medication we will try to get it covered through the foundation. Patient voiced understanding.   Allison Atkinson, [place], CPhT

## 2016-03-16 NOTE — Progress Notes (Signed)
Received denial from patient's insurance regarding Humira.  Appeal letter was written and signed by Mr. Leane Call.  Faxed letter to [facility] at [phone].  Submitted notes from patient's last two office visits with appeal.

## 2016-03-22 ENCOUNTER — Telehealth: Payer: Self-pay

## 2016-03-22 NOTE — Telephone Encounter (Signed)
Patient returned call. Informed her that we got the approval from [facility] for Humira. She has enough medication to last her until May 2018. Will give her a humira coupon at her next appointment (next week) as well as discuss which pharmacy she would like to use. Patient voiced understanding and had no other questions as this time.   Ailyne Pawley, [place], CPhT 2:43 PM

## 2016-03-22 NOTE — Telephone Encounter (Signed)
Received a fax from [facility] regarding a prior authorization approval for HUMIRA from 03/16/16 to 01/23/38.   Reference number:[id number]  Phone number: [phone]  Will send document to scan center  Left message for patient to call back. Need to let her know that because her insurance has approved her medication, she can no longer use the patient assistance program. We have a coupon in the office that she can use (it can also be uploaded online at www.humira.com). She could pay as little as $5 per month with the coupon. We also need to find out what specialty pharmacy she prefers to use. Thanks!  Timm Bonenberger, [place], CPhT

## 2016-03-29 NOTE — Progress Notes (Signed)
Office Visit Note  Patient: Allison Atkinson             Date of Birth: [birth year]           MRN: [id number]             PCP: Asencion Noble, MD Referring: Asencion Noble, MD Visit Date: 03/30/2016 Occupation: @GUAROCC @    Subjective:  Follow-up Follow-up on spondyloarthropathy and high-risk prescription  History of Present Illness: Allison Atkinson is a 41 y.o. female  Last seen 07/14/2015  Patient is doing really well with her spondyloarthropathy when she takes her Humira on Fridays. She reports that she starts to feel pain if she misses the Humira injection by even 1 day. When she takes it regularly it is controlling her symptoms really well.  She has a history of osteoarthritis to bilateral knee joint. She did really well with her Euflex injections in the past and is requesting to restart the series again since her knees are bothering her.  She was depressed and she was put on antidepressant which cause her to gain significant amount of weight. That is causing additional burden on her knees and she is really needing help with her knee injections. Today she wants to know if we can give her cortisone injection in her right knee for pain relief. I'll be happy to do that and we going to assess for the Euflex injection for both knees   Activities of Daily Living:  Patient reports morning stiffness for 15 minutes.   Patient Denies nocturnal pain.  Difficulty dressing/grooming: Denies Difficulty climbing stairs: Denies Difficulty getting out of chair: Reports Difficulty using hands for taps, buttons, cutlery, and/or writing: Reports   Review of Systems  Constitutional: Negative for fatigue.  HENT: Negative for mouth sores and mouth dryness.   Eyes: Negative for dryness.  Respiratory: Negative for shortness of breath.   Gastrointestinal: Negative for constipation and diarrhea.  Musculoskeletal: Negative for myalgias and myalgias.  Skin: Negative for sensitivity to sunlight.    Psychiatric/Behavioral: Negative for decreased concentration and sleep disturbance.    PMFS History:  Patient Active Problem List   Diagnosis Date Noted  . High risk medication use 03/30/2016  . Acute midline low back pain without sciatica 03/30/2016  . Primary osteoarthritis of both knees 01/12/2016  . Spondylosis of lumbar region without myelopathy or radiculopathy 01/12/2016  . Sacroiliitis, not elsewhere classified ([place]) 01/12/2016  . Chronic diarrhea 01/12/2016  . History of gastroesophageal reflux (GERD) 01/12/2016  . History of fatty infiltration of liver 01/12/2016  . Hypertension 05/09/2013  . Palpitations 04/11/2013  . Spondyloarthropathy ([place]) 10/24/2009  . PCOS (polycystic ovarian syndrome) 01/25/2003  . Depression with anxiety 01/25/2003  . GERD (gastroesophageal reflux disease) 01/25/2003  . Hypothyroidism 01/24/2001    Past Medical History:  Diagnosis Date  . Anxiety   . Bipolar disorder ([place])   . Fatty liver disease, nonalcoholic   . History of cholecystectomy   . Hypertension   . Polycystic ovarian disease   . RA (rheumatoid arthritis) ([place])   . Thyroid disease    hypothyroid    Family History  Problem Relation Age of Onset  . Hypertension Mother   . Alcohol abuse Father    Past Surgical History:  Procedure Laterality Date  . CHOLECYSTECTOMY  1999  . COLONOSCOPY W/ BIOPSIES  2010  . ESOPHAGOGASTRODUODENOSCOPY  2010  . KNEE ARTHROSCOPY Right    Social History   Social History Narrative  . No narrative on file  Objective: Vital Signs: BP 131/87   Pulse (!) 102   Resp 16   Ht 5' 4"  (1.626 m)   Wt 296 lb (134.3 kg)   BMI 50.81 kg/m    Physical Exam  Constitutional: She is oriented to person, place, and time. She appears well-developed and well-nourished.  HENT:  Head: Normocephalic and atraumatic.  Eyes: EOM are normal. Pupils are equal, round, and reactive to light.  Cardiovascular: Normal rate, regular rhythm and normal heart  sounds.  Exam reveals no gallop and no friction rub.   No murmur heard. Pulmonary/Chest: Effort normal and breath sounds normal. She has no wheezes. She has no rales.  Abdominal: Soft. Bowel sounds are normal. She exhibits no distension. There is no tenderness. There is no guarding. No hernia.  Musculoskeletal: Normal range of motion. She exhibits no edema, tenderness or deformity.  Lymphadenopathy:    She has no cervical adenopathy.  Neurological: She is alert and oriented to person, place, and time. Coordination normal.  Skin: Skin is warm and dry. Capillary refill takes less than 2 seconds. No rash noted.  Psychiatric: She has a normal mood and affect. Her behavior is normal.  Nursing note and vitals reviewed.    Musculoskeletal Exam:  Full range of motion of all joints Grip strength is equal and strong bilaterally except patient does have pain to bilateral CMC joint with fist formation Grip strength is equal and strong bilaterally  CDAI Exam: No CDAI exam completed.  No synovitis on examination  Investigation: Findings:  TB Gold Negative-12/2015  Labs from May 12, 2015, show CBC with diff is normal.  TSH is normal.  IgA quantitative serum is normal.  TTG is normal.  Note, all of these labs were ordered by Dr. Collene Mares.  This is the patient's GI doctor.  Orders Only on 01/05/2016  Component Date Value Ref Range Status  . Sodium 01/05/2016 137  135 - 146 mmol/L Final  . Potassium 01/05/2016 4.0  3.5 - 5.3 mmol/L Final  . Chloride 01/05/2016 102  98 - 110 mmol/L Final  . CO2 01/05/2016 25  20 - 31 mmol/L Final  . Glucose, Bld 01/05/2016 165* 65 - 99 mg/dL Final  . BUN 01/05/2016 5* 7 - 25 mg/dL Final  . Creat 01/05/2016 0.66  0.50 - 1.10 mg/dL Final  . Total Bilirubin 01/05/2016 0.3  0.2 - 1.2 mg/dL Final  . Alkaline Phosphatase 01/05/2016 57  33 - 115 U/L Final  . AST 01/05/2016 28  10 - 30 U/L Final  . ALT 01/05/2016 30* 6 - 29 U/L Final  . Total Protein 01/05/2016 6.5  6.1  - 8.1 g/dL Final  . Albumin 01/05/2016 4.0  3.6 - 5.1 g/dL Final  . Calcium 01/05/2016 8.9  8.6 - 10.2 mg/dL Final  . GFR, Est African American 01/05/2016 >89  >=60 mL/min Final  . GFR, Est Non African American 01/05/2016 >89  >=60 mL/min Final  . WBC 01/05/2016 10.2  3.8 - 10.8 K/uL Final  . RBC 01/05/2016 4.46  3.80 - 5.10 MIL/uL Final  . Hemoglobin 01/05/2016 13.3  11.7 - 15.5 g/dL Final  . HCT 01/05/2016 39.6  35.0 - 45.0 % Final  . MCV 01/05/2016 88.8  80.0 - 100.0 fL Final  . MCH 01/05/2016 29.8  27.0 - 33.0 pg Final  . MCHC 01/05/2016 33.6  32.0 - 36.0 g/dL Final  . RDW 01/05/2016 14.3  11.0 - 15.0 % Final  . Platelets 01/05/2016 337  140 - 400 K/uL  Final  . MPV 01/05/2016 10.0  7.5 - 12.5 fL Final  . Neutro Abs 01/05/2016 5100  1,500 - 7,800 cells/uL Final  . Lymphs Abs 01/05/2016 3774  850 - 3,900 cells/uL Final  . Monocytes Absolute 01/05/2016 612  200 - 950 cells/uL Final  . Eosinophils Absolute 01/05/2016 612* 15 - 500 cells/uL Final  . Basophils Absolute 01/05/2016 102  0 - 200 cells/uL Final  . Neutrophils Relative % 01/05/2016 50  % Final  . Lymphocytes Relative 01/05/2016 37  % Final  . Monocytes Relative 01/05/2016 6  % Final  . Eosinophils Relative 01/05/2016 6  % Final  . Basophils Relative 01/05/2016 1  % Final  . Smear Review 01/05/2016 Criteria for review not met   Final  . Interferon Gamma Release Assay 01/05/2016 NEGATIVE  NEGATIVE Final  . Quantiferon Nil Value 01/05/2016 0.01  IU/mL Final  . Mitogen-Nil 01/05/2016 >10.00  IU/mL Final  . Quantiferon Tb Ag Minus Nil Value 01/05/2016 0.01  IU/mL Final   Comment:   The Nil tube value is used to determine if the patient has a preexisting immune response which could cause a false-positive reading on the test. In order for a test to be valid, the Nil tube must have a value of less than or equal to 8.0 IU/mL.   The mitogen control tube is used to assure the patient has a healthy immune status and also serves as  a control for correct blood handling and incubation. It is used to detect false-negative readings. The mitogen tube must have a gamma interferon value of greater than or equal to 0.5 IU/mL higher than the value of the Nil tube.   The TB antigen tube is coated with the M. tuberculosis specific antigens. For a test to be considered positive, the TB antigen tube value minus the Nil tube value must be greater than or equal to 0.35 IU/mL.   For additional information, please refer to http://education.questdiagnostics.com/faq/QFT (This link is being provided for informational/educational purposes only.)       Imaging: No results found.  Speciality Comments: No specialty comments available.    Procedures:  Large Joint Inj Date/Time: 03/30/2016 3:26 PM Performed by: Eliezer Lofts Authorized by: Eliezer Lofts   Consent Given by:  Patient Site marked: the procedure site was marked   Timeout: prior to procedure the correct patient, procedure, and site was verified   Indications:  Pain and joint swelling Location:  Knee Site:  R knee Prep: patient was prepped and draped in usual sterile fashion   Needle Size:  27 G Needle Length:  1.5 inches Approach:  Medial Ultrasound Guidance: No   Fluoroscopic Guidance: No   Arthrogram: No   Medications:  1.5 mL lidocaine 1 %; 40 mg triamcinolone acetonide 40 MG/ML Aspiration Attempted: Yes   Patient tolerance:  Patient tolerated the procedure well with no immediate complications  Significant pain to bilateral knees She wants the right knee injected today. After informed consent was obtained the site was prepped in usual sterile fashion is 240 mg of Kenalog mixed with one half and also 1% lidocaine. Patient tolerated procedure well. Carlene Coria make an appointment for the left knee to be injected sometime in the near future.   Allergies: Oxycodone-acetaminophen and Latex   Assessment / Plan:     Visit Diagnoses: Spondyloarthropathy  ([place]) - HLA-B27 negative  High risk medication use - Humira-50m/0.8ML injection (?every 2 weeks on Fridays) - Plan: CBC with Differential/Platelet, COMPLETE METABOLIC PANEL WITH  GFR, CBC with Differential/Platelet, COMPLETE METABOLIC PANEL WITH GFR  Primary osteoarthritis of both knees - Status post Euflexxa x3 on May 28, 2015  Spondylosis of lumbar region without myelopathy or radiculopathy  Acute midline low back pain without sciatica   Plan: #1: Spondyloarthropathy is well addressed with Humira every 2 weeks. Patient does feel pain starting a day or 2 before her Humira as do if she misses a dose. She's been taking her medication regularly and doing well.  #2: History of bilateral knee joint OA. She did really well with the flexor 3 back in 05/28/2015. She is requesting a repeat series. We will apply for Euflex or for both of her knees. If the insurance company does not approval Euflexxa, we can do Hyalgan or Orthovisc. Any of these 3 options will be acceptable She's having significant pain to both of her knees at this time and she is asking for the right knee to be injected.  After informed consent was obtained the right knee was prepped in usual sterile fashion and injected with 40 mg of Kenalog mixed with one half mL's 1% lidocaine. Patient tolerated procedure well. No complications. Patient wants to have the left knee injected in a week or so and so she will make an appointment for that. We are unsure how quickly her Visco supplementation will be approved.  #3: She will need labs today that include CBC with differential, CMP with GFR. She will do it every 3 months after today.  #4: Return to clinic in 5 months  #5: TB go was negative 01/07/2016 and will not be due again until December 2018.    Orders: Orders Placed This Encounter  Procedures  . Large Joint Injection/Arthrocentesis  . CBC with Differential/Platelet  . COMPLETE METABOLIC PANEL WITH GFR   No orders of the  defined types were placed in this encounter.   Face-to-face time spent with patient was 30 minutes. 50% of time was spent in counseling and coordination of care.  Follow-Up Instructions: No Follow-up on file.   Eliezer Lofts, PA-C  Note - This record has been created using [employer].  Chart creation errors have been sought, but may not always  have been located. Such creation errors do not reflect on  the standard of medical care.

## 2016-03-30 ENCOUNTER — Ambulatory Visit (INDEPENDENT_AMBULATORY_CARE_PROVIDER_SITE_OTHER): Payer: BLUE CROSS/BLUE SHIELD | Admitting: Rheumatology

## 2016-03-30 ENCOUNTER — Encounter: Payer: Self-pay | Admitting: Rheumatology

## 2016-03-30 VITALS — BP 131/87 | HR 102 | Resp 16 | Ht 64.0 in | Wt 296.0 lb

## 2016-03-30 DIAGNOSIS — M47819 Spondylosis without myelopathy or radiculopathy, site unspecified: Secondary | ICD-10-CM

## 2016-03-30 DIAGNOSIS — M545 Low back pain, unspecified: Secondary | ICD-10-CM | POA: Insufficient documentation

## 2016-03-30 DIAGNOSIS — Z79899 Other long term (current) drug therapy: Secondary | ICD-10-CM | POA: Insufficient documentation

## 2016-03-30 DIAGNOSIS — M4690 Unspecified inflammatory spondylopathy, site unspecified: Secondary | ICD-10-CM

## 2016-03-30 DIAGNOSIS — M170 Bilateral primary osteoarthritis of knee: Secondary | ICD-10-CM | POA: Diagnosis not present

## 2016-03-30 DIAGNOSIS — M25561 Pain in right knee: Secondary | ICD-10-CM

## 2016-03-30 DIAGNOSIS — G8929 Other chronic pain: Secondary | ICD-10-CM

## 2016-03-30 DIAGNOSIS — M47816 Spondylosis without myelopathy or radiculopathy, lumbar region: Secondary | ICD-10-CM | POA: Diagnosis not present

## 2016-03-30 LAB — CBC WITH DIFFERENTIAL/PLATELET
BASOS PCT: 1 %
Basophils Absolute: 86 cells/uL (ref 0–200)
EOS ABS: 258 {cells}/uL (ref 15–500)
Eosinophils Relative: 3 %
HCT: 42.6 % (ref 35.0–45.0)
Hemoglobin: 14.3 g/dL (ref 11.7–15.5)
Lymphocytes Relative: 22 %
Lymphs Abs: 1892 cells/uL (ref 850–3900)
MCH: 30 pg (ref 27.0–33.0)
MCHC: 33.6 g/dL (ref 32.0–36.0)
MCV: 89.3 fL (ref 80.0–100.0)
MONOS PCT: 8 %
MPV: 9.4 fL (ref 7.5–12.5)
Monocytes Absolute: 688 cells/uL (ref 200–950)
Neutro Abs: 5676 cells/uL (ref 1500–7800)
Neutrophils Relative %: 66 %
PLATELETS: 348 10*3/uL (ref 140–400)
RBC: 4.77 MIL/uL (ref 3.80–5.10)
RDW: 13.8 % (ref 11.0–15.0)
WBC: 8.6 10*3/uL (ref 3.8–10.8)

## 2016-03-30 LAB — COMPLETE METABOLIC PANEL WITH GFR
ALT: 82 U/L — AB (ref 6–29)
AST: 89 U/L — ABNORMAL HIGH (ref 10–30)
Albumin: 4.3 g/dL (ref 3.6–5.1)
Alkaline Phosphatase: 67 U/L (ref 33–115)
BUN: 8 mg/dL (ref 7–25)
CALCIUM: 9.4 mg/dL (ref 8.6–10.2)
CHLORIDE: 101 mmol/L (ref 98–110)
CO2: 23 mmol/L (ref 20–31)
Creat: 0.7 mg/dL (ref 0.50–1.10)
GFR, Est Non African American: >89 mL/min (ref >=60)
Glucose, Bld: 135 mg/dL — ABNORMAL HIGH (ref 65–99)
Potassium: 3.9 mmol/L (ref 3.5–5.3)
Sodium: 137 mmol/L (ref 135–146)
Total Bilirubin: 0.4 mg/dL (ref 0.2–1.2)
Total Protein: 7.3 g/dL (ref 6.1–8.1)

## 2016-03-30 MED ORDER — LIDOCAINE HCL 1 % IJ SOLN
1.5000 mL | INTRAMUSCULAR | Status: AC | PRN
  Administered 2016-03-30: 1.5 mL

## 2016-03-30 MED ORDER — TRIAMCINOLONE ACETONIDE 40 MG/ML IJ SUSP
40.0000 mg | INTRAMUSCULAR | Status: AC | PRN
  Administered 2016-03-30: 40 mg via INTRA_ARTICULAR

## 2016-04-04 ENCOUNTER — Other Ambulatory Visit: Payer: Self-pay

## 2016-04-04 ENCOUNTER — Other Ambulatory Visit: Payer: Self-pay | Admitting: Rheumatology

## 2016-04-04 MED ORDER — METOPROLOL TARTRATE 25 MG PO TABS: ORAL_TABLET | ORAL | 0 refills | Status: DC

## 2016-04-04 NOTE — Telephone Encounter (Signed)
03/30/16 last visit 04/28/16 next visit  Ok to refill per Dr Corliss Skains

## 2016-04-05 ENCOUNTER — Other Ambulatory Visit: Payer: Self-pay | Admitting: Rheumatology

## 2016-04-05 DIAGNOSIS — M069 Rheumatoid arthritis, unspecified: Secondary | ICD-10-CM

## 2016-04-05 NOTE — Telephone Encounter (Signed)
03/30/16 last visit  04/28/16 next visit  Labs: AST 89 ALT 82  Okay to refill Celebrex?

## 2016-04-05 NOTE — Telephone Encounter (Signed)
Per uptodate: Hepatic: Increased liver enzymes (<3x ULN: =6%)

## 2016-04-11 ENCOUNTER — Telehealth: Payer: Self-pay | Admitting: Rheumatology

## 2016-04-11 NOTE — Telephone Encounter (Signed)
Beth from [facility] called to schedule patient's delivery of Euflexxa.  CB#[phone].  Thank you.

## 2016-04-12 NOTE — Telephone Encounter (Signed)
Euflexxa Bil to be delivered to office 04/14/16, patient purchase

## 2016-04-14 ENCOUNTER — Telehealth (INDEPENDENT_AMBULATORY_CARE_PROVIDER_SITE_OTHER): Payer: Self-pay | Admitting: *Deleted

## 2016-04-14 NOTE — Telephone Encounter (Signed)
Euflexxa delivered to office 04/14/16, please schedule Euflexxa inj. appt x 3 with Lisabeth Register on Monday or Wednesday afternoons, Bilateral, patient purchased. Thank you.

## 2016-04-21 NOTE — Telephone Encounter (Signed)
Left message on machine for patient to call back to schedule Euflexxa injections.  

## 2016-04-28 ENCOUNTER — Ambulatory Visit: Payer: BLUE CROSS/BLUE SHIELD | Admitting: Rheumatology

## 2016-05-02 ENCOUNTER — Ambulatory Visit (INDEPENDENT_AMBULATORY_CARE_PROVIDER_SITE_OTHER): Payer: BLUE CROSS/BLUE SHIELD | Admitting: Rheumatology

## 2016-05-02 DIAGNOSIS — M25562 Pain in left knee: Secondary | ICD-10-CM

## 2016-05-02 DIAGNOSIS — M1712 Unilateral primary osteoarthritis, left knee: Secondary | ICD-10-CM

## 2016-05-02 DIAGNOSIS — M170 Bilateral primary osteoarthritis of knee: Secondary | ICD-10-CM

## 2016-05-02 DIAGNOSIS — M1711 Unilateral primary osteoarthritis, right knee: Secondary | ICD-10-CM | POA: Diagnosis not present

## 2016-05-02 DIAGNOSIS — M25561 Pain in right knee: Secondary | ICD-10-CM

## 2016-05-02 DIAGNOSIS — G8929 Other chronic pain: Secondary | ICD-10-CM

## 2016-05-02 MED ORDER — LIDOCAINE HCL 1 % IJ SOLN
1.5000 mL | INTRAMUSCULAR | Status: AC | PRN
  Administered 2016-05-02: 1.5 mL

## 2016-05-02 MED ORDER — SODIUM HYALURONATE (VISCOSUP) 20 MG/2ML IX SOSY
20.0000 mg | PREFILLED_SYRINGE | INTRA_ARTICULAR | Status: AC | PRN
  Administered 2016-05-02: 20 mg via INTRA_ARTICULAR

## 2016-05-02 NOTE — Progress Notes (Signed)
   Procedure Note  Patient: Allison Atkinson             Date of Birth: 01-Aug-1975           MRN: [id number]             Visit Date: 05/02/2016  Procedures: Visit Diagnoses: No diagnosis found.  Large Joint Inj Date/Time: 05/02/2016 8:48 AM Performed by: Tawni Pummel Authorized by: Tawni Pummel   Consent Given by:  Patient Site marked: the procedure site was marked   Timeout: prior to procedure the correct patient, procedure, and site was verified   Indications:  Pain and joint swelling Location:  Knee Site:  R knee Prep: patient was prepped and draped in usual sterile fashion   Needle Size:  27 G Needle Length:  1.5 inches Approach:  Medial Ultrasound Guidance: No   Fluoroscopic Guidance: No   Arthrogram: No   Medications:  1.5 mL lidocaine 1 %; 20 mg Sodium Hyaluronate 20 MG/2ML Aspiration Attempted: Yes   Aspirate amount (mL):  0 Patient tolerance:  Patient tolerated the procedure well with no immediate complications  Pt tolerated procedure well. No complication  2nd series of euflexxa starting today; today is injection number 1 of 3 to both knees. Large Joint Inj Date/Time: 05/02/2016 8:49 AM Performed by: Tawni Pummel Authorized by: Tawni Pummel   Consent Given by:  Patient Site marked: the procedure site was marked   Timeout: prior to procedure the correct patient, procedure, and site was verified   Indications:  Pain and joint swelling Location:  Knee Site:  L knee Prep: patient was prepped and draped in usual sterile fashion   Needle Size:  27 G Needle Length:  1.5 inches Approach:  Medial Ultrasound Guidance: No   Fluoroscopic Guidance: No   Arthrogram: No   Medications:  1.5 mL lidocaine 1 %; 20 mg Sodium Hyaluronate 20 MG/2ML Aspiration Attempted: Yes   Aspirate amount (mL):  0 Patient tolerance:  Patient tolerated the procedure well with no immediate complications  Pt tolerated procedure well. No complication  2nd series of euflexxa  starting today; today is injection number 1 of 3 to both knees.

## 2016-05-09 ENCOUNTER — Ambulatory Visit (INDEPENDENT_AMBULATORY_CARE_PROVIDER_SITE_OTHER): Payer: BLUE CROSS/BLUE SHIELD | Admitting: Rheumatology

## 2016-05-09 DIAGNOSIS — M1712 Unilateral primary osteoarthritis, left knee: Secondary | ICD-10-CM | POA: Diagnosis not present

## 2016-05-09 DIAGNOSIS — G8929 Other chronic pain: Secondary | ICD-10-CM

## 2016-05-09 DIAGNOSIS — M25562 Pain in left knee: Principal | ICD-10-CM

## 2016-05-09 DIAGNOSIS — M1711 Unilateral primary osteoarthritis, right knee: Secondary | ICD-10-CM

## 2016-05-09 DIAGNOSIS — M170 Bilateral primary osteoarthritis of knee: Secondary | ICD-10-CM

## 2016-05-09 DIAGNOSIS — M25561 Pain in right knee: Principal | ICD-10-CM

## 2016-05-09 MED ORDER — LIDOCAINE HCL 1 % IJ SOLN
1.5000 mL | INTRAMUSCULAR | Status: AC | PRN
  Administered 2016-05-09: 1.5 mL

## 2016-05-09 MED ORDER — SODIUM HYALURONATE (VISCOSUP) 20 MG/2ML IX SOSY
20.0000 mg | PREFILLED_SYRINGE | INTRA_ARTICULAR | Status: AC | PRN
  Administered 2016-05-09: 20 mg via INTRA_ARTICULAR

## 2016-05-09 NOTE — Progress Notes (Signed)
   Procedure Note  Patient: Allison Atkinson             Date of Birth: [birth year]           MRN: [id number]             Visit Date: 05/09/2016  Procedures: Visit Diagnoses: Chronic pain of both knees  Primary osteoarthritis of both knees  Large Joint Inj Date/Time: 05/09/2016 8:56 AM Performed by: Tawni Pummel Authorized by: Tawni Pummel   Consent Given by:  Patient Site marked: the procedure site was marked   Timeout: prior to procedure the correct patient, procedure, and site was verified   Indications:  Pain and joint swelling Location:  Knee Prep: patient was prepped and draped in usual sterile fashion   Needle Size:  27 G Needle Length:  1.5 inches Approach:  Medial Ultrasound Guidance: No   Fluoroscopic Guidance: No   Arthrogram: No   Medications:  1.5 mL lidocaine 1 %; 20 mg Sodium Hyaluronate 20 MG/2ML Aspiration Attempted: Yes   Aspirate amount (mL):  0 Patient tolerance:  Patient tolerated the procedure well with no immediate complications  Euflex and #2 bilateral knees Patient provided this Euflex.  Large Joint Inj Date/Time: 05/09/2016 8:57 AM Performed by: Tawni Pummel Authorized by: Tawni Pummel   Consent Given by:  Patient Site marked: the procedure site was marked   Timeout: prior to procedure the correct patient, procedure, and site was verified   Indications:  Pain and joint swelling Location:  Knee Site:  L knee Prep: patient was prepped and draped in usual sterile fashion   Needle Size:  27 G Needle Length:  1.5 inches Approach:  Medial Ultrasound Guidance: No   Fluoroscopic Guidance: No   Arthrogram: No   Medications:  1.5 mL lidocaine 1 %; 20 mg Sodium Hyaluronate 20 MG/2ML Aspiration Attempted: Yes   Aspirate amount (mL):  0 Patient tolerance:  Patient tolerated the procedure well with no immediate complications  Euflex and #2 bilateral knees Patient provided this Euflex.   Today, patient states that she received more than  50% improvement after the first shot. Patient states that the first series of Euflexxa did well but the second series is doing even better than the first series. In the first shot was much better overall.   Return to clinic in 1 week for final Euflexxa ( #3) to both knees.

## 2016-05-17 ENCOUNTER — Other Ambulatory Visit: Payer: Self-pay | Admitting: Rheumatology

## 2016-05-17 DIAGNOSIS — M069 Rheumatoid arthritis, unspecified: Secondary | ICD-10-CM

## 2016-05-18 NOTE — Telephone Encounter (Signed)
03/30/16 last visit and CBC CMP 05/23/16 next visit  Ok to refill per Dr Corliss Skains

## 2016-05-19 ENCOUNTER — Telehealth: Payer: Self-pay | Admitting: Radiology

## 2016-05-19 DIAGNOSIS — M47819 Spondylosis without myelopathy or radiculopathy, site unspecified: Secondary | ICD-10-CM

## 2016-05-19 MED ORDER — ADALIMUMAB 40 MG/0.8ML ~~LOC~~ AJKT: 1.0000 "pen " | AUTO-INJECTOR | SUBCUTANEOUS | 0 refills | Status: DC

## 2016-05-19 NOTE — Telephone Encounter (Signed)
Refill request received via fax for  Humira from [facility] prime Last Rheum visit 03/30/16 Follow up sch 09/01/16 Labs 03/30/16 CBC CMP TB gold neg on 01/05/16 Ok to refill per Dr Corliss Skains

## 2016-05-23 ENCOUNTER — Ambulatory Visit (INDEPENDENT_AMBULATORY_CARE_PROVIDER_SITE_OTHER): Payer: BLUE CROSS/BLUE SHIELD | Admitting: Rheumatology

## 2016-05-23 DIAGNOSIS — M1711 Unilateral primary osteoarthritis, right knee: Secondary | ICD-10-CM

## 2016-05-23 DIAGNOSIS — M25561 Pain in right knee: Secondary | ICD-10-CM

## 2016-05-23 DIAGNOSIS — M1712 Unilateral primary osteoarthritis, left knee: Secondary | ICD-10-CM | POA: Diagnosis not present

## 2016-05-23 DIAGNOSIS — M170 Bilateral primary osteoarthritis of knee: Secondary | ICD-10-CM

## 2016-05-23 DIAGNOSIS — G8929 Other chronic pain: Secondary | ICD-10-CM

## 2016-05-23 DIAGNOSIS — M25562 Pain in left knee: Secondary | ICD-10-CM

## 2016-05-23 MED ORDER — TRIAMCINOLONE ACETONIDE 40 MG/ML IJ SUSP
40.0000 mg | INTRAMUSCULAR | Status: AC | PRN
  Administered 2016-05-23: 40 mg via INTRA_ARTICULAR

## 2016-05-23 MED ORDER — LIDOCAINE HCL 1 % IJ SOLN
2.0000 mL | INTRAMUSCULAR | Status: AC | PRN
  Administered 2016-05-23: 2 mL

## 2016-05-23 MED ORDER — SODIUM HYALURONATE (VISCOSUP) 20 MG/2ML IX SOSY
20.0000 mg | PREFILLED_SYRINGE | INTRA_ARTICULAR | Status: AC | PRN
  Administered 2016-05-23: 20 mg via INTRA_ARTICULAR

## 2016-05-23 NOTE — Progress Notes (Signed)
   Procedure Note  Patient: Allison Atkinson             Date of Birth: 1975/10/05           MRN: [id number]             Visit Date: 05/23/2016  Procedures: Visit Diagnoses: No diagnosis found.  Large Joint Inj Date/Time: 05/23/2016 1:20 PM Performed by: Tawni Pummel Authorized by: Tawni Pummel   Consent Given by:  Patient Site marked: the procedure site was marked   Timeout: prior to procedure the correct patient, procedure, and site was verified   Indications:  Pain and joint swelling Location:  Knee Site:  R knee Prep: patient was prepped and draped in usual sterile fashion   Needle Size:  27 G Needle Length:  1.5 inches Approach:  Medial Ultrasound Guidance: No   Fluoroscopic Guidance: No   Arthrogram: No   Medications:  2 mL lidocaine 1 %; 20 mg Sodium Hyaluronate 20 MG/2ML Aspiration Attempted: Yes   Aspirate amount (mL):  0 Patient tolerance:  Patient tolerated the procedure well with no immediate complications  Euflex and #3 both knees 2 mL's of 1% Xylocaine without epinephrine followed by one prefilled syringe of Euflex Patient purchased the Euflexxa  Large Joint Inj Date/Time: 05/23/2016 1:21 PM Performed by: Tawni Pummel Authorized by: Tawni Pummel   Consent Given by:  Patient Site marked: the procedure site was marked   Timeout: prior to procedure the correct patient, procedure, and site was verified   Indications:  Pain and joint swelling Location:  Knee Site:  L knee Prep: patient was prepped and draped in usual sterile fashion   Needle Size:  27 G Needle Length:  1.5 inches Approach:  Medial Ultrasound Guidance: No   Fluoroscopic Guidance: No   Arthrogram: No   Medications:  40 mg triamcinolone acetonide 40 MG/ML; 2 mL lidocaine 1 %; 20 mg Sodium Hyaluronate 20 MG/2ML Aspiration Attempted: Yes   Aspirate amount (mL):  0 Patient tolerance:  Patient tolerated the procedure well with no immediate complications  Euflex and #3 both knees 2  mL's of 1% Xylocaine without epinephrine followed by one prefilled syringe of Euflex Patient purchased the Euflexxa

## 2016-06-08 ENCOUNTER — Other Ambulatory Visit: Payer: Self-pay | Admitting: Cardiovascular Disease

## 2016-06-18 ENCOUNTER — Other Ambulatory Visit: Payer: Self-pay | Admitting: Rheumatology

## 2016-06-18 DIAGNOSIS — M069 Rheumatoid arthritis, unspecified: Secondary | ICD-10-CM

## 2016-06-21 NOTE — Telephone Encounter (Signed)
Last Visit: 03/30/16 Next Visit: 09/01/16 Labs: 03/30/16 AST 89 ALT 82  Okay to refill Celebrex and Methocarbamol?

## 2016-07-06 ENCOUNTER — Telehealth: Payer: Self-pay | Admitting: Rheumatology

## 2016-07-06 ENCOUNTER — Other Ambulatory Visit: Payer: Self-pay | Admitting: Radiology

## 2016-07-06 DIAGNOSIS — Z79899 Other long term (current) drug therapy: Secondary | ICD-10-CM

## 2016-07-06 LAB — COMPLETE METABOLIC PANEL WITH GFR
ALT: 45 U/L — ABNORMAL HIGH (ref 6–29)
AST: 29 U/L (ref 10–30)
Albumin: 4.3 g/dL (ref 3.6–5.1)
Alkaline Phosphatase: 68 U/L (ref 33–115)
BUN: 7 mg/dL (ref 7–25)
CALCIUM: 9.4 mg/dL (ref 8.6–10.2)
CO2: 24 mmol/L (ref 20–31)
Chloride: 101 mmol/L (ref 98–110)
Creat: 0.75 mg/dL (ref 0.50–1.10)
GFR, Est African American: >89 mL/min (ref >=60)
GFR, Est Non African American: >89 mL/min (ref >=60)
Glucose, Bld: 118 mg/dL — ABNORMAL HIGH (ref 65–99)
POTASSIUM: 4.4 mmol/L (ref 3.5–5.3)
SODIUM: 137 mmol/L (ref 135–146)
Total Bilirubin: 0.3 mg/dL (ref 0.2–1.2)
Total Protein: 7.2 g/dL (ref 6.1–8.1)

## 2016-07-06 LAB — CBC WITH DIFFERENTIAL/PLATELET
Basophils Absolute: 127 cells/uL (ref 0–200)
Basophils Relative: 1 %
EOS PCT: 3 %
Eosinophils Absolute: 381 cells/uL (ref 15–500)
HEMATOCRIT: 41.1 % (ref 35.0–45.0)
Hemoglobin: 13.5 g/dL (ref 11.7–15.5)
Lymphocytes Relative: 40 %
Lymphs Abs: 5080 cells/uL — ABNORMAL HIGH (ref 850–3900)
MCH: 29.5 pg (ref 27.0–33.0)
MCHC: 32.8 g/dL (ref 32.0–36.0)
MCV: 89.9 fL (ref 80.0–100.0)
MONOS PCT: 6 %
MPV: 9.7 fL (ref 7.5–12.5)
Monocytes Absolute: 762 cells/uL (ref 200–950)
NEUTROS PCT: 50 %
Neutro Abs: 6350 cells/uL (ref 1500–7800)
PLATELETS: 366 10*3/uL (ref 140–400)
RBC: 4.57 MIL/uL (ref 3.80–5.10)
RDW: 14.2 % (ref 11.0–15.0)
WBC: 12.7 10*3/uL — AB (ref 3.8–10.8)

## 2016-07-06 NOTE — Telephone Encounter (Signed)
Released.

## 2016-07-06 NOTE — Telephone Encounter (Signed)
Patient is at [facility] lab right now and they need her labs released in the system.  CB#[phone].  Thank you.

## 2016-07-07 ENCOUNTER — Other Ambulatory Visit (HOSPITAL_COMMUNITY): Payer: Self-pay | Admitting: General Surgery

## 2016-07-19 ENCOUNTER — Ambulatory Visit: Payer: BLUE CROSS/BLUE SHIELD | Admitting: Skilled Nursing Facility1

## 2016-07-29 ENCOUNTER — Encounter (HOSPITAL_COMMUNITY): Payer: Self-pay

## 2016-07-29 ENCOUNTER — Inpatient Hospital Stay (HOSPITAL_COMMUNITY): Admission: RE | Admit: 2016-07-29 | Payer: BLUE CROSS/BLUE SHIELD | Source: Ambulatory Visit

## 2016-07-29 ENCOUNTER — Ambulatory Visit (HOSPITAL_COMMUNITY): Payer: BLUE CROSS/BLUE SHIELD

## 2016-08-06 ENCOUNTER — Other Ambulatory Visit: Payer: Self-pay | Admitting: Rheumatology

## 2016-08-06 DIAGNOSIS — M069 Rheumatoid arthritis, unspecified: Secondary | ICD-10-CM

## 2016-08-08 ENCOUNTER — Encounter: Payer: Self-pay | Admitting: Registered"

## 2016-08-08 ENCOUNTER — Encounter: Payer: BLUE CROSS/BLUE SHIELD | Attending: General Surgery | Admitting: Registered"

## 2016-08-08 DIAGNOSIS — Z713 Dietary counseling and surveillance: Secondary | ICD-10-CM | POA: Insufficient documentation

## 2016-08-08 DIAGNOSIS — E669 Obesity, unspecified: Secondary | ICD-10-CM

## 2016-08-08 NOTE — Progress Notes (Signed)
Pre-Op Assessment Visit:  Pre-Operative RYGB Surgery  Medical Nutrition Therapy:  Appt start time: 4:10  End time:  5:11  Patient was seen on 08/08/2016 for Pre-Operative Nutrition Assessment. Assessment and letter of approval faxed to [facility] Bariatric Surgery Program coordinator on 08/08/2016.   Pt expectation of surgery: "relief in knees and back,  decrease risk of diabetes (currently prediabetic), improve sleep apnea, prevent further health problems"  Pt expectation of Dietitian: provide information with a variety of options available; help know when to eat, not to overeat, listening to hunger cues  Start weight at [facility]: 310.0 BMI: 54.48   Pt arrives with husband, husband is talkative. Pt states she has battled depression on and off for 8 years. Pt reports her newest medication has caused her to feel better lately. Pt reports medication changes have  increased appetite and some caused weight gain in the past. Pt states she does not eat during a day and overeats at night. Pt reports waking up daily at 9am, eats breakfast then goes back to sleep until dinner time.   Per insurance, pt needs 4 SWL visits prior to surgery. Per paperwork, pt will do 4 visits with PCP.    24 hr Dietary Recall: First Meal: Premier protein shake Snack: none Second Meal: skipped Snack: none Third Meal: take out Snack: microwaveable burritos or chips or cereal Beverages:water, Sun Drop, sweet tea, premier protein shake  Encouraged to engage in 150 minutes of moderate physical activity including cardiovascular and weight baring weekly  Handouts given during visit include:  . Pre-Op Goals . Bariatric Surgery Protein Shakes . Vitamin and Mineral Recommendations  During the appointment today the following Pre-Op Goals were reviewed with the patient: . Maintain or lose weight as instructed by your surgeon . Make healthy food choices . Begin to limit portion sizes . Limited concentrated  sugars and fried foods . Keep fat/sugar in the single digits per serving on          food labels . Practice CHEWING your food  (aim for 30 chews per bite or until applesauce consistency) . Practice not drinking 15 minutes before, during, and 30 minutes after each meal/snack . Avoid all carbonated beverages  . Avoid/limit caffeinated beverages  . Avoid all sugar-sweetened beverages . Consume 3 meals per day; eat every 3-5 hours . Make a list of non-food related activities . Aim for 64-100 ounces of FLUID daily  . Aim for at least 60-80 grams of PROTEIN daily . Look for a liquid protein source that contain ?15 g protein and ?5 g carbohydrate  (ex: shakes, drinks, shots) . Physical activity is an important part of a healthy lifestyle so keep it moving!  Follow diet recommendations listed below Energy and Macronutrient Recommendations: Calories: 1800 Carbohydrate: 200 Protein: 135 Fat: 50  Demonstrated degree of understanding via:  Teach Back   Teaching Method Utilized:  Visual Auditory Hands on  Barriers to learning/adherence to lifestyle change: none  Patient to call the Nutrition and Diabetes Education Services to enroll in Pre-Op and Post-Op Nutrition Education when surgery date is scheduled.

## 2016-08-08 NOTE — Telephone Encounter (Signed)
Last Visit: 03/30/16 Next Visit: 09/01/16 Labs: 07/06/16 Mild elevation in WBC 12.3, slight elevation ALT 45.  Okay to refill per Dr. Corliss Skains

## 2016-08-12 ENCOUNTER — Telehealth: Payer: Self-pay

## 2016-08-12 NOTE — Telephone Encounter (Signed)
Allison Atkinson with [facility] called concerning a new Rx refill for Humira for patient.  CB# is [phone], Fax # is [phone].  Patient CB# is [phone].

## 2016-08-16 DIAGNOSIS — E782 Mixed hyperlipidemia: Secondary | ICD-10-CM | POA: Insufficient documentation

## 2016-08-16 DIAGNOSIS — E559 Vitamin D deficiency, unspecified: Secondary | ICD-10-CM | POA: Insufficient documentation

## 2016-08-16 MED ORDER — ADALIMUMAB 40 MG/0.8ML ~~LOC~~ AJKT: 1.0000 "pen " | AUTO-INJECTOR | SUBCUTANEOUS | 0 refills | Status: DC

## 2016-08-16 NOTE — Telephone Encounter (Signed)
Last Visit: 03/30/16 Next Visit: 09/01/16 Labs: 07/06/16 Mild elevation in WBC 12.3, slight elevation ALT 45 TB Gold: 01/05/16 Neg  Okay to refill per Dr. Corliss Skains

## 2016-08-16 NOTE — Addendum Note (Signed)
Addended by: Henriette Combs on: 08/16/2016 10:23 AM   Modules accepted: Orders

## 2016-09-01 ENCOUNTER — Encounter: Payer: Self-pay | Admitting: Rheumatology

## 2016-09-01 ENCOUNTER — Ambulatory Visit (INDEPENDENT_AMBULATORY_CARE_PROVIDER_SITE_OTHER): Payer: BLUE CROSS/BLUE SHIELD | Admitting: Rheumatology

## 2016-09-01 VITALS — BP 128/74 | HR 82 | Resp 16 | Ht 63.5 in | Wt 312.0 lb

## 2016-09-01 DIAGNOSIS — Z79899 Other long term (current) drug therapy: Secondary | ICD-10-CM

## 2016-09-01 DIAGNOSIS — M533 Sacrococcygeal disorders, not elsewhere classified: Secondary | ICD-10-CM | POA: Diagnosis not present

## 2016-09-01 DIAGNOSIS — M545 Low back pain, unspecified: Secondary | ICD-10-CM

## 2016-09-01 DIAGNOSIS — M4690 Unspecified inflammatory spondylopathy, site unspecified: Secondary | ICD-10-CM

## 2016-09-01 DIAGNOSIS — M47819 Spondylosis without myelopathy or radiculopathy, site unspecified: Secondary | ICD-10-CM

## 2016-09-01 DIAGNOSIS — E669 Obesity, unspecified: Secondary | ICD-10-CM

## 2016-09-01 DIAGNOSIS — Z6841 Body Mass Index (BMI) 40.0 and over, adult: Secondary | ICD-10-CM

## 2016-09-01 DIAGNOSIS — IMO0001 Reserved for inherently not codable concepts without codable children: Secondary | ICD-10-CM

## 2016-09-01 LAB — COMPLETE METABOLIC PANEL WITH GFR
ALBUMIN: 4.2 g/dL (ref 3.6–5.1)
ALK PHOS: 56 U/L (ref 33–115)
ALT: 57 U/L — ABNORMAL HIGH (ref 6–29)
AST: 41 U/L — AB (ref 10–30)
BILIRUBIN TOTAL: 0.3 mg/dL (ref 0.2–1.2)
BUN: 10 mg/dL (ref 7–25)
CO2: 23 mmol/L (ref 20–32)
Calcium: 9.3 mg/dL (ref 8.6–10.2)
Chloride: 103 mmol/L (ref 98–110)
Creat: 0.63 mg/dL (ref 0.50–1.10)
GFR, Est African American: >89 mL/min (ref >=60)
GFR, Est Non African American: >89 mL/min (ref >=60)
GLUCOSE: 84 mg/dL (ref 65–99)
Potassium: 4.1 mmol/L (ref 3.5–5.3)
SODIUM: 137 mmol/L (ref 135–146)
TOTAL PROTEIN: 6.7 g/dL (ref 6.1–8.1)

## 2016-09-01 LAB — CBC WITH DIFFERENTIAL/PLATELET
BASOS PCT: 1 %
Basophils Absolute: 111 cells/uL (ref 0–200)
EOS PCT: 4 %
Eosinophils Absolute: 444 cells/uL (ref 15–500)
HCT: 41.3 % (ref 35.0–45.0)
HEMOGLOBIN: 13.7 g/dL (ref 11.7–15.5)
LYMPHS ABS: 5550 {cells}/uL — AB (ref 850–3900)
Lymphocytes Relative: 50 %
MCH: 30.5 pg (ref 27.0–33.0)
MCHC: 33.2 g/dL (ref 32.0–36.0)
MCV: 92 fL (ref 80.0–100.0)
MPV: 9.6 fL (ref 7.5–12.5)
Monocytes Absolute: 666 cells/uL (ref 200–950)
Monocytes Relative: 6 %
NEUTROS ABS: 4329 {cells}/uL (ref 1500–7800)
Neutrophils Relative %: 39 %
Platelets: 357 10*3/uL (ref 140–400)
RBC: 4.49 MIL/uL (ref 3.80–5.10)
RDW: 14 % (ref 11.0–15.0)
WBC: 11.1 10*3/uL — AB (ref 3.8–10.8)

## 2016-09-01 NOTE — Progress Notes (Signed)
Office Visit Note  Patient: Allison Atkinson             Date of Birth: [birth year]           MRN: [id number]             PCP: Asencion Noble, MD Referring: Asencion Noble, MD Visit Date: 09/01/2016 Occupation: @GUAROCC @    Subjective:  Pain of the Lower Back (Down left leg has seen PCP ) and Medication Management   History of Present Illness: Allison Atkinson is a 41 y.o. female  Who was last seen in our office on 03/30/2016 for spondyloarthropathy and high-risk prescription.   Today, patient reports that she has pain in her lower back a week after she gets her Humira injection. She wants to know if she can take to Humira a week early to control her spondyloarthropathy pain. I advised her against this. I encouraged her to monitor her pain and let us know at the next office visit.  Currently, she is scheduled to see Dr. Nelva Bush for lower back injections. I advised her to evaluate how well she responds with those injections. If she does well with those injections and she continues to use Humira every 2 weeks with good results, she needs to let us know.  I suspect spondyloarthropathy is well controlled with Humira injections every 2 weeks and her back pain is coming from another source which needs to be evaluated and treated.  Activities of Daily Living:  Patient reports morning stiffness for 15 minutes.   Patient Reports nocturnal pain.  Difficulty dressing/grooming: Denies Difficulty climbing stairs: Reports Difficulty getting out of chair: Reports Difficulty using hands for taps, buttons, cutlery, and/or writing: Denies   Review of Systems  Constitutional: Negative for fatigue.  HENT: Negative for mouth sores and mouth dryness.   Eyes: Negative for dryness.  Respiratory: Negative for shortness of breath.   Gastrointestinal: Negative for constipation and diarrhea.  Musculoskeletal: Positive for arthralgias (lower lumbar vertebral area back pain (l3-L5 region)) and joint pain (lower  lumbar vertebral area back pain (l3-L5 region)). Negative for myalgias and myalgias.  Skin: Negative for sensitivity to sunlight.  Psychiatric/Behavioral: Negative for decreased concentration and sleep disturbance.    PMFS History:  Patient Active Problem List   Diagnosis Date Noted  . High risk medication use 03/30/2016  . Acute midline low back pain without sciatica 03/30/2016  . Primary osteoarthritis of both knees 01/12/2016  . Spondylosis of lumbar region without myelopathy or radiculopathy 01/12/2016  . Sacroiliitis, not elsewhere classified ([place]) 01/12/2016  . Chronic diarrhea 01/12/2016  . History of gastroesophageal reflux (GERD) 01/12/2016  . History of fatty infiltration of liver 01/12/2016  . Hypertension 05/09/2013  . Palpitations 04/11/2013  . Spondyloarthropathy ([place]) 10/24/2009  . PCOS (polycystic ovarian syndrome) 01/25/2003  . Depression with anxiety 01/25/2003  . GERD (gastroesophageal reflux disease) 01/25/2003  . Hypothyroidism 01/24/2001    Past Medical History:  Diagnosis Date  . Anxiety   . Bipolar disorder ([place])   . Fatty liver disease, nonalcoholic   . History of cholecystectomy   . Hypertension   . Polycystic ovarian disease   . RA (rheumatoid arthritis) ([place])   . Thyroid disease    hypothyroid    Family History  Problem Relation Age of Onset  . Hypertension Mother   . Alcohol abuse Father    Past Surgical History:  Procedure Laterality Date  . CHOLECYSTECTOMY  1999  . COLONOSCOPY W/ BIOPSIES  2010  . ESOPHAGOGASTRODUODENOSCOPY  2010  . KNEE ARTHROSCOPY Right    Social History   Social History Narrative  . No narrative on file     Objective: Vital Signs: BP 128/74   Pulse 82   Resp 16   Ht 5' 3.5" (1.613 m)   Wt (!) 312 lb (141.5 kg)   BMI 54.40 kg/m    Physical Exam  Constitutional: She is oriented to person, place, and time. She appears well-developed and well-nourished.  HENT:  Head: Normocephalic and atraumatic.  Eyes:  Pupils are equal, round, and reactive to light. EOM are normal.  Cardiovascular: Normal rate, regular rhythm and normal heart sounds.  Exam reveals no gallop and no friction rub.   No murmur heard. Pulmonary/Chest: Effort normal and breath sounds normal. She has no wheezes. She has no rales.  Abdominal: Soft. Bowel sounds are normal. She exhibits no distension. There is no tenderness. There is no guarding. No hernia.  Musculoskeletal: Normal range of motion. She exhibits tenderness (lumbar back and bilateral SI Joint). She exhibits no edema or deformity.  Lymphadenopathy:    She has no cervical adenopathy.  Neurological: She is alert and oriented to person, place, and time. Coordination normal.  Skin: Skin is warm and dry. Capillary refill takes less than 2 seconds. No rash noted.  Psychiatric: She has a normal mood and affect. Her behavior is normal.  Nursing note and vitals reviewed.    Musculoskeletal Exam:  Full range of motion of all joints Grip strength is equal and strong bilaterally Fibromyalgia tender points are all absent (Pain to back at about the L4-L5 region midline; pain to bilateral SI joint on palpation)  CDAI Exam: No CDAI exam completed.  No synovitis  Investigation: No additional findings. Orders Only on 07/06/2016  Component Date Value Ref Range Status  . WBC 07/06/2016 12.7* 3.8 - 10.8 K/uL Final  . RBC 07/06/2016 4.57  3.80 - 5.10 MIL/uL Final  . Hemoglobin 07/06/2016 13.5  11.7 - 15.5 g/dL Final  . HCT 07/06/2016 41.1  35.0 - 45.0 % Final  . MCV 07/06/2016 89.9  80.0 - 100.0 fL Final  . MCH 07/06/2016 29.5  27.0 - 33.0 pg Final  . MCHC 07/06/2016 32.8  32.0 - 36.0 g/dL Final  . RDW 07/06/2016 14.2  11.0 - 15.0 % Final  . Platelets 07/06/2016 366  140 - 400 K/uL Final  . MPV 07/06/2016 9.7  7.5 - 12.5 fL Final  . Neutro Abs 07/06/2016 6350  1,500 - 7,800 cells/uL Final  . Lymphs Abs 07/06/2016 5080* 850 - 3,900 cells/uL Final  . Monocytes Absolute  07/06/2016 762  200 - 950 cells/uL Final  . Eosinophils Absolute 07/06/2016 381  15 - 500 cells/uL Final  . Basophils Absolute 07/06/2016 127  0 - 200 cells/uL Final  . Neutrophils Relative % 07/06/2016 50  % Final  . Lymphocytes Relative 07/06/2016 40  % Final  . Monocytes Relative 07/06/2016 6  % Final  . Eosinophils Relative 07/06/2016 3  % Final  . Basophils Relative 07/06/2016 1  % Final  . Smear Review 07/06/2016 SEE NOTE   Final   Criteria for review not met.  . Sodium 07/06/2016 137  135 - 146 mmol/L Final  . Potassium 07/06/2016 4.4  3.5 - 5.3 mmol/L Final  . Chloride 07/06/2016 101  98 - 110 mmol/L Final  . CO2 07/06/2016 24  20 - 31 mmol/L Final  . Glucose, Bld 07/06/2016 118* 65 - 99 mg/dL Final  . BUN 07/06/2016  7  7 - 25 mg/dL Final  . Creat 07/06/2016 0.75  0.50 - 1.10 mg/dL Final  . Total Bilirubin 07/06/2016 0.3  0.2 - 1.2 mg/dL Final  . Alkaline Phosphatase 07/06/2016 68  33 - 115 U/L Final  . AST 07/06/2016 29  10 - 30 U/L Final  . ALT 07/06/2016 45* 6 - 29 U/L Final  . Total Protein 07/06/2016 7.2  6.1 - 8.1 g/dL Final  . Albumin 07/06/2016 4.3  3.6 - 5.1 g/dL Final  . Calcium 07/06/2016 9.4  8.6 - 10.2 mg/dL Final  . GFR, Est African American 07/06/2016 >89  >=60 mL/min Final  . GFR, Est Non African American 07/06/2016 >89  >=60 mL/min Final  Office Visit on 03/30/2016  Component Date Value Ref Range Status  . WBC 03/30/2016 8.6  3.8 - 10.8 K/uL Final  . RBC 03/30/2016 4.77  3.80 - 5.10 MIL/uL Final  . Hemoglobin 03/30/2016 14.3  11.7 - 15.5 g/dL Final  . HCT 03/30/2016 42.6  35.0 - 45.0 % Final  . MCV 03/30/2016 89.3  80.0 - 100.0 fL Final  . MCH 03/30/2016 30.0  27.0 - 33.0 pg Final  . MCHC 03/30/2016 33.6  32.0 - 36.0 g/dL Final  . RDW 03/30/2016 13.8  11.0 - 15.0 % Final  . Platelets 03/30/2016 348  140 - 400 K/uL Final  . MPV 03/30/2016 9.4  7.5 - 12.5 fL Final  . Neutro Abs 03/30/2016 5676  1,500 - 7,800 cells/uL Final  . Lymphs Abs 03/30/2016 1892   850 - 3,900 cells/uL Final  . Monocytes Absolute 03/30/2016 688  200 - 950 cells/uL Final  . Eosinophils Absolute 03/30/2016 258  15 - 500 cells/uL Final  . Basophils Absolute 03/30/2016 86  0 - 200 cells/uL Final  . Neutrophils Relative % 03/30/2016 66  % Final  . Lymphocytes Relative 03/30/2016 22  % Final  . Monocytes Relative 03/30/2016 8  % Final  . Eosinophils Relative 03/30/2016 3  % Final  . Basophils Relative 03/30/2016 1  % Final  . Smear Review 03/30/2016 Criteria for review not met   Final  . Sodium 03/30/2016 137  135 - 146 mmol/L Final  . Potassium 03/30/2016 3.9  3.5 - 5.3 mmol/L Final  . Chloride 03/30/2016 101  98 - 110 mmol/L Final  . CO2 03/30/2016 23  20 - 31 mmol/L Final  . Glucose, Bld 03/30/2016 135* 65 - 99 mg/dL Final  . BUN 03/30/2016 8  7 - 25 mg/dL Final  . Creat 03/30/2016 0.70  0.50 - 1.10 mg/dL Final  . Total Bilirubin 03/30/2016 0.4  0.2 - 1.2 mg/dL Final  . Alkaline Phosphatase 03/30/2016 67  33 - 115 U/L Final  . AST 03/30/2016 89* 10 - 30 U/L Final  . ALT 03/30/2016 82* 6 - 29 U/L Final  . Total Protein 03/30/2016 7.3  6.1 - 8.1 g/dL Final  . Albumin 03/30/2016 4.3  3.6 - 5.1 g/dL Final  . Calcium 03/30/2016 9.4  8.6 - 10.2 mg/dL Final  . GFR, Est African American 03/30/2016 >89  >=60 mL/min Final  . GFR, Est Non African American 03/30/2016 >89  >=60 mL/min Final     Imaging: No results found.  Speciality Comments: No specialty comments available.    Procedures:  No procedures performed Allergies: Risperdal [risperidone]; Oxycodone-acetaminophen; and Latex   Assessment / Plan:     Visit Diagnoses: High risk medications (not anticoagulants) long-term use - Plan: CBC with Differential/Platelet, COMPLETE METABOLIC PANEL WITH  GFR  Spondyloarthropathy (HCC)  Acute midline low back pain without sciatica  Pain of both sacroiliac joints   #1: Ankylosing spondylitis. Patient is doing well but sometime she has pain about a week before her  Humira injection is due. She asked if she can take the Humira earlier than scheduled. I advised the patient to call our office to confirm with Dr. Estanislado Pandy so she can get guidance. We may need to change her Humira to another medication in case it is becoming ineffective. We will monitor this with the patient and she will let us know at the next office visit which we will schedule for December 2018.  #2: High risk prescription  Humira every 2 weeks. Patient's last labs were June 2018 and were within normal limits. Today we will do CBC with differential and CMP with GFR Note: Patient will need repeat labs in November 2018: CBC with differential, CMP with GFR, TB gold. (Last TB go was December 2017 and she'll be due for again November 2018).  #3: Low back pain. Patient will see Dr. Nelva Bush we will be giving her treatment for this.  #4: Bilateral SI joint pain I advised the patient that she is having bilateral SI joint pain and she should discuss that with Dr. Jeanell Sparrow most also. He may want to give her injection there if appropriate.  #5: Return to clinic in December 2018. I would like to make sure the patient is responding well to Humira. If she continues to have increased pain and she requires Humira on a more frequent basis, we may want have a discussion December to see if she needs alternative treatment.      Orders: Orders Placed This Encounter  Procedures  . CBC with Differential/Platelet  . COMPLETE METABOLIC PANEL WITH GFR   No orders of the defined types were placed in this encounter.   Face-to-face time spent with patient was 30 minutes. 50% of time was spent in counseling and coordination of care.  Follow-Up Instructions: Return in about 4 months (around 01/01/2017).   Eliezer Lofts, PA-C  Note - This record has been created using [employer].  Chart creation errors have been sought, but may not always  have been located. Such creation errors do not reflect on  the  standard of medical care.

## 2016-09-06 ENCOUNTER — Ambulatory Visit (HOSPITAL_COMMUNITY): Admission: RE | Admit: 2016-09-06 | Payer: BLUE CROSS/BLUE SHIELD | Source: Ambulatory Visit

## 2016-09-06 ENCOUNTER — Ambulatory Visit (HOSPITAL_COMMUNITY): Payer: BLUE CROSS/BLUE SHIELD | Attending: General Surgery

## 2016-10-03 ENCOUNTER — Other Ambulatory Visit: Payer: Self-pay | Admitting: Cardiovascular Disease

## 2016-10-05 ENCOUNTER — Ambulatory Visit (HOSPITAL_COMMUNITY)
Admission: RE | Admit: 2016-10-05 | Discharge: 2016-10-05 | Disposition: A | Discharge status: Home / Self Care | Payer: BLUE CROSS/BLUE SHIELD | Source: Ambulatory Visit | Attending: General Surgery | Admitting: General Surgery

## 2016-10-05 ENCOUNTER — Other Ambulatory Visit: Payer: Self-pay

## 2016-10-05 DIAGNOSIS — R9431 Abnormal electrocardiogram [ECG] [EKG]: Secondary | ICD-10-CM | POA: Insufficient documentation

## 2016-10-05 DIAGNOSIS — Z01810 Encounter for preprocedural cardiovascular examination: Secondary | ICD-10-CM | POA: Diagnosis not present

## 2016-11-08 ENCOUNTER — Telehealth: Payer: Self-pay | Admitting: *Deleted

## 2016-11-08 NOTE — Telephone Encounter (Signed)
Left message for patient to call the office.   Received fax from [facility] in regards to delivery on humira, they have been unable to reach patient to schedule delivery.

## 2016-11-15 ENCOUNTER — Other Ambulatory Visit: Payer: Self-pay | Admitting: Cardiovascular Disease

## 2016-11-15 ENCOUNTER — Other Ambulatory Visit: Payer: Self-pay | Admitting: Rheumatology

## 2016-11-15 DIAGNOSIS — M069 Rheumatoid arthritis, unspecified: Secondary | ICD-10-CM

## 2016-11-15 NOTE — Telephone Encounter (Signed)
Last Visit: 09/01/16 Next Visit: 01/11/17  Okay to refill per Dr. Corliss Skains

## 2016-11-15 NOTE — Telephone Encounter (Signed)
Last Visit: 09/01/16 Next Visit: 01/11/17 Labs: 09/01/16 stable  Okay to refill per Dr. Corliss Skains

## 2016-11-22 ENCOUNTER — Other Ambulatory Visit: Payer: Self-pay | Admitting: Rheumatology

## 2016-11-23 NOTE — Telephone Encounter (Signed)
Last Visit: 09/01/16 Next Visit: 01/11/17 Labs: 09/01/16 stable TB Gold: 12/2015 Neg  Okay to refill per Dr. Corliss Skains

## 2016-12-31 NOTE — Progress Notes (Deleted)
Office Visit Note  Patient: Allison Atkinson             Date of Birth: Dec 09, 1975           MRN: [id number]             PCP: Carylon Perches, MD Referring: Carylon Perches, MD Visit Date: 01/11/2017 Occupation: @GUAROCC @    Subjective:  No chief complaint on file.   History of Present Illness: Allison Atkinson is a 41 y.o. female ***   Activities of Daily Living:  Patient reports morning stiffness for *** {minute/hour:19697}.   Patient {ACTIONS;DENIES/REPORTS:21021675::"Denies"} nocturnal pain.  Difficulty dressing/grooming: {ACTIONS;DENIES/REPORTS:21021675::"Denies"} Difficulty climbing stairs: {ACTIONS;DENIES/REPORTS:21021675::"Denies"} Difficulty getting out of chair: {ACTIONS;DENIES/REPORTS:21021675::"Denies"} Difficulty using hands for taps, buttons, cutlery, and/or writing: {ACTIONS;DENIES/REPORTS:21021675::"Denies"}   No Rheumatology ROS completed.   PMFS History:  Patient Active Problem List   Diagnosis Date Noted  . High risk medication use 03/30/2016  . Acute midline low back pain without sciatica 03/30/2016  . Primary osteoarthritis of both knees 01/12/2016  . Spondylosis of lumbar region without myelopathy or radiculopathy 01/12/2016  . Sacroiliitis, not elsewhere classified (HCC) 01/12/2016  . Chronic diarrhea 01/12/2016  . History of gastroesophageal reflux (GERD) 01/12/2016  . History of fatty infiltration of liver 01/12/2016  . Hypertension 05/09/2013  . Palpitations 04/11/2013  . Spondyloarthropathy ([facility]) 10/24/2009  . PCOS (polycystic ovarian syndrome) 01/25/2003  . Depression with anxiety 01/25/2003  . GERD (gastroesophageal reflux disease) 01/25/2003  . Hypothyroidism 01/24/2001    Past Medical History:  Diagnosis Date  . Anxiety   . Bipolar disorder ([facility])   . Fatty liver disease, nonalcoholic   . History of cholecystectomy   . Hypertension   . Polycystic ovarian disease   . RA (rheumatoid arthritis) ([facility])   . Thyroid disease    hypothyroid      Family History  Problem Relation Age of Onset  . Hypertension Mother   . Alcohol abuse Father    Past Surgical History:  Procedure Laterality Date  . CHOLECYSTECTOMY  1999  . COLONOSCOPY W/ BIOPSIES  2010  . ESOPHAGOGASTRODUODENOSCOPY  2010  . KNEE ARTHROSCOPY Right    Social History   Social History Narrative  . Not on file     Objective: Vital Signs: There were no vitals taken for this visit.   Physical Exam   Musculoskeletal Exam: ***  CDAI Exam: No CDAI exam completed.    Investigation: No additional findings.TB Gold: 01/05/2016 Negative  CBC Latest Ref Rng & Units 09/01/2016 07/06/2016 03/30/2016  WBC 3.8 - 10.8 K/uL 11.1(H) 12.7(H) 8.6  Hemoglobin 11.7 - 15.5 g/dL 05/30/2016 93.8 18.2  Hematocrit 35.0 - 45.0 % 41.3 41.1 42.6  Platelets 140 - 400 K/uL 357 366 348   CMP Latest Ref Rng & Units 09/01/2016 07/06/2016 03/30/2016  Glucose 65 - 99 mg/dL 84 05/30/2016) 716(R)  BUN 7 - 25 mg/dL 10 7 8   Creatinine 0.50 - 1.10 mg/dL 678(L 3.81  Sodium 135 - 146 mmol/L 137 137 137  Potassium 3.5 - 5.3 mmol/L 4.1 4.4 3.9  Chloride 98 - 110 mmol/L 103 101 101  CO2 20 - 32 mmol/L 23 24 23   Calcium 8.6 - 10.2 mg/dL 9.3 9.4 9.4  Total Protein 6.1 - 8.1 g/dL 6.7 7.2 7.3  Total Bilirubin 0.2 - 1.2 mg/dL 0.3 0.3 0.4  Alkaline Phos 33 - 115 U/L 56 68 67  AST 10 - 30 U/L 41(H) 29 89(H)  ALT 6 - 29 U/L 57(H) 45(H) 82(H)  Imaging: No results found.  Speciality Comments: No specialty comments available.    Procedures:  No procedures performed Allergies: Risperdal [risperidone]; Oxycodone-acetaminophen; and Latex   Assessment / Plan:     Visit Diagnoses: No diagnosis found.    Orders: No orders of the defined types were placed in this encounter.  No orders of the defined types were placed in this encounter.   Face-to-face time spent with patient was *** minutes. 50% of time was spent in counseling and coordination of care.  Follow-Up Instructions: No Follow-up on  file.   Ellen Henri, CMA  Note - This record has been created using [employer].  Chart creation errors have been sought, but may not always  have been located. Such creation errors do not reflect on  the standard of medical care.

## 2017-01-03 ENCOUNTER — Other Ambulatory Visit: Payer: Self-pay | Admitting: Cardiovascular Disease

## 2017-01-11 ENCOUNTER — Ambulatory Visit: Payer: BLUE CROSS/BLUE SHIELD | Admitting: Rheumatology

## 2017-01-26 ENCOUNTER — Other Ambulatory Visit: Payer: Self-pay | Admitting: Rheumatology

## 2017-01-26 DIAGNOSIS — M069 Rheumatoid arthritis, unspecified: Secondary | ICD-10-CM

## 2017-01-27 NOTE — Progress Notes (Deleted)
Office Visit Note  Patient: Allison Atkinson             Date of Birth: Dec 14, 1975           MRN: [id number]             PCP: Carylon Perches, MD Referring: Carylon Perches, MD Visit Date: 01/30/2017 Occupation: @GUAROCC @    Subjective:  No chief complaint on file.   History of Present Illness: Allison Atkinson is a 42 y.o. female ***   Activities of Atkinson Living:  Patient reports morning stiffness for *** {minute/hour:19697}.   Patient {ACTIONS;DENIES/REPORTS:21021675::"Denies"} nocturnal pain.  Difficulty dressing/grooming: {ACTIONS;DENIES/REPORTS:21021675::"Denies"} Difficulty climbing stairs: {ACTIONS;DENIES/REPORTS:21021675::"Denies"} Difficulty getting out of chair: {ACTIONS;DENIES/REPORTS:21021675::"Denies"} Difficulty using hands for taps, buttons, cutlery, and/or writing: {ACTIONS;DENIES/REPORTS:21021675::"Denies"}   No Rheumatology ROS completed.   PMFS History:  Patient Active Problem List   Diagnosis Date Noted  . High risk medication use 03/30/2016  . Acute midline low back pain without sciatica 03/30/2016  . Primary osteoarthritis of both knees 01/12/2016  . Spondylosis of lumbar region without myelopathy or radiculopathy 01/12/2016  . Sacroiliitis, not elsewhere classified (HCC) 01/12/2016  . Chronic diarrhea 01/12/2016  . History of gastroesophageal reflux (GERD) 01/12/2016  . History of fatty infiltration of liver 01/12/2016  . Hypertension 05/09/2013  . Palpitations 04/11/2013  . Spondyloarthropathy ([facility]CC) 10/24/2009  . PCOS (polycystic ovarian syndrome) 01/25/2003  . Depression with anxiety 01/25/2003  . GERD (gastroesophageal reflux disease) 01/25/2003  . Hypothyroidism 01/24/2001    Past Medical History:  Diagnosis Date  . Anxiety   . Bipolar disorder ([facility])   . Fatty liver disease, nonalcoholic   . History of cholecystectomy   . Hypertension   . Polycystic ovarian disease   . RA (rheumatoid arthritis) ([facility])   . Thyroid disease    hypothyroid      Family History  Problem Relation Age of Onset  . Hypertension Mother   . Alcohol abuse Father    Past Surgical History:  Procedure Laterality Date  . CHOLECYSTECTOMY  1999  . COLONOSCOPY W/ BIOPSIES  2010  . ESOPHAGOGASTRODUODENOSCOPY  2010  . KNEE ARTHROSCOPY Right    Social History   Social History Narrative  . Not on file     Objective: Vital Signs: There were no vitals taken for this visit.   Physical Exam   Musculoskeletal Exam: ***  CDAI Exam: No CDAI exam completed.    Investigation: No additional findings.TB Gold: 01/05/2016 Negative  CBC Latest Ref Rng & Units 09/01/2016 07/06/2016 03/30/2016  WBC 3.8 - 10.8 K/uL 11.1(H) 12.7(H) 8.6  Hemoglobin 11.7 - 15.5 g/dL 05/30/2016 21.1 94.1  Hematocrit 35.0 - 45.0 % 41.3 41.1 42.6  Platelets 140 - 400 K/uL 357 366 348   CMP Latest Ref Rng & Units 09/01/2016 07/06/2016 03/30/2016  Glucose 65 - 99 mg/dL 84 05/30/2016) 814(G)  BUN 7 - 25 mg/dL 10 7 8   Creatinine 0.50 - 1.10 mg/dL 818(H 6.31  Sodium 135 - 146 mmol/L 137 137 137  Potassium 3.5 - 5.3 mmol/L 4.1 4.4 3.9  Chloride 98 - 110 mmol/L 103 101 101  CO2 20 - 32 mmol/L 23 24 23   Calcium 8.6 - 10.2 mg/dL 9.3 9.4 9.4  Total Protein 6.1 - 8.1 g/dL 6.7 7.2 7.3  Total Bilirubin 0.2 - 1.2 mg/dL 0.3 0.3 0.4  Alkaline Phos 33 - 115 U/L 56 68 67  AST 10 - 30 U/L 41(H) 29 89(H)  ALT 6 - 29 U/L 57(H) 45(H) 82(H)  Imaging: No results found.  Speciality Comments: No specialty comments available.    Procedures:  No procedures performed Allergies: Risperdal [risperidone]; Oxycodone-acetaminophen; and Latex   Assessment / Plan:     Visit Diagnoses: No diagnosis found.    Orders: No orders of the defined types were placed in this encounter.  No orders of the defined types were placed in this encounter.   Face-to-face time spent with patient was *** minutes. 50% of time was spent in counseling and coordination of care.  Follow-Up Instructions: No Follow-up on  file.   Ellen Henri, CMA  Note - This record has been created using [employer].  Chart creation errors have been sought, but may not always  have been located. Such creation errors do not reflect on  the standard of medical care.

## 2017-01-27 NOTE — Telephone Encounter (Addendum)
Last Visit: 09/01/16 Next Visit: 01/30/17 Labs: 09/01/16 stable  Okay to refill per Dr. Corliss Skains

## 2017-01-30 ENCOUNTER — Ambulatory Visit: Payer: Self-pay | Admitting: Rheumatology

## 2017-02-03 NOTE — Progress Notes (Deleted)
Office Visit Note  Patient: Allison Atkinson             Date of Birth: 29-May-1975           MRN: [id number]             PCP: Carylon Perches, MD Referring: Carylon Perches, MD Visit Date: 02/06/2017 Occupation: @GUAROCC @    Subjective:  No chief complaint on file.   History of Present Illness: Allison Atkinson is a 42 y.o. female ***   Activities of Daily Living:  Patient reports morning stiffness for *** {minute/hour:19697}.   Patient {ACTIONS;DENIES/REPORTS:21021675::"Denies"} nocturnal pain.  Difficulty dressing/grooming: {ACTIONS;DENIES/REPORTS:21021675::"Denies"} Difficulty climbing stairs: {ACTIONS;DENIES/REPORTS:21021675::"Denies"} Difficulty getting out of chair: {ACTIONS;DENIES/REPORTS:21021675::"Denies"} Difficulty using hands for taps, buttons, cutlery, and/or writing: {ACTIONS;DENIES/REPORTS:21021675::"Denies"}   No Rheumatology ROS completed.   PMFS History:  Patient Active Problem List   Diagnosis Date Noted  . High risk medication use 03/30/2016  . Acute midline low back pain without sciatica 03/30/2016  . Primary osteoarthritis of both knees 01/12/2016  . Spondylosis of lumbar region without myelopathy or radiculopathy 01/12/2016  . Sacroiliitis, not elsewhere classified (HCC) 01/12/2016  . Chronic diarrhea 01/12/2016  . History of gastroesophageal reflux (GERD) 01/12/2016  . History of fatty infiltration of liver 01/12/2016  . Hypertension 05/09/2013  . Palpitations 04/11/2013  . Spondyloarthropathy (HCC) 10/24/2009  . PCOS (polycystic ovarian syndrome) 01/25/2003  . Depression with anxiety 01/25/2003  . GERD (gastroesophageal reflux disease) 01/25/2003  . Hypothyroidism 01/24/2001    Past Medical History:  Diagnosis Date  . Anxiety   . Bipolar disorder (HCC)   . Fatty liver disease, nonalcoholic   . History of cholecystectomy   . Hypertension   . Polycystic ovarian disease   . RA (rheumatoid arthritis) (HCC)   . Thyroid disease    hypothyroid      Family History  Problem Relation Age of Onset  . Hypertension Mother   . Alcohol abuse Father    Past Surgical History:  Procedure Laterality Date  . CHOLECYSTECTOMY  1999  . COLONOSCOPY W/ BIOPSIES  2010  . ESOPHAGOGASTRODUODENOSCOPY  2010  . KNEE ARTHROSCOPY Right    Social History   Social History Narrative  . Not on file     Objective: Vital Signs: There were no vitals taken for this visit.   Physical Exam   Musculoskeletal Exam: ***  CDAI Exam: No CDAI exam completed.    Investigation: No additional findings.  01/05/16 TB gold negative  CBC Latest Ref Rng & Units 09/01/2016 07/06/2016 03/30/2016  WBC 3.8 - 10.8 K/uL 11.1(H) 12.7(H) 8.6  Hemoglobin 11.7 - 15.5 g/dL 05/30/2016 48.1 85.6  Hematocrit 35.0 - 45.0 % 41.3 41.1 42.6  Platelets 140 - 400 K/uL 357 366 348   CMP Latest Ref Rng & Units 09/01/2016 07/06/2016 03/30/2016  Glucose 65 - 99 mg/dL 84 05/30/2016) 970(Y)  BUN 7 - 25 mg/dL 10 7 8   Creatinine 0.50 - 1.10 mg/dL 637(C 5.88  Sodium 135 - 146 mmol/L 137 137 137  Potassium 3.5 - 5.3 mmol/L 4.1 4.4 3.9  Chloride 98 - 110 mmol/L 103 101 101  CO2 20 - 32 mmol/L 23 24 23   Calcium 8.6 - 10.2 mg/dL 9.3 9.4 9.4  Total Protein 6.1 - 8.1 g/dL 6.7 7.2 7.3  Total Bilirubin 0.2 - 1.2 mg/dL 0.3 0.3 0.4  Alkaline Phos 33 - 115 U/L 56 68 67  AST 10 - 30 U/L 41(H) 29 89(H)  ALT 6 - 29 U/L 57(H) 45(H)  82(H)    Imaging: No results found.  Speciality Comments: No specialty comments available.    Procedures:  No procedures performed Allergies: Risperdal [risperidone]; Oxycodone-acetaminophen; and Latex   Assessment / Plan:     Visit Diagnoses: Spondyloarthropathy (HCC)  High risk medication use - Humira  Sacroiliitis, not elsewhere classified (HCC)  Primary osteoarthritis of both knees  Spondylosis of lumbar region without myelopathy or radiculopathy  Essential hypertension  Hypothyroidism, unspecified type  PCOS (polycystic ovarian  syndrome)  Palpitations  History of gastroesophageal reflux (GERD)    Orders: No orders of the defined types were placed in this encounter.  No orders of the defined types were placed in this encounter.   Face-to-face time spent with patient was *** minutes. 50% of time was spent in counseling and coordination of care.  Follow-Up Instructions: No Follow-up on file.   Gearldine Bienenstock, PA-C  Note - This record has been created using Dragon software.  Chart creation errors have been sought, but may not always  have been located. Such creation errors do not reflect on  the standard of medical care.

## 2017-02-06 ENCOUNTER — Ambulatory Visit: Payer: Self-pay | Admitting: Rheumatology

## 2017-02-23 ENCOUNTER — Other Ambulatory Visit: Payer: Self-pay | Admitting: Rheumatology

## 2017-02-23 NOTE — Telephone Encounter (Signed)
Left message to advise patient will need updated labs before we can refill medication

## 2017-02-23 NOTE — Telephone Encounter (Signed)
Last Visit: 09/01/16 Next Visit: 03/13/17 Labs: 09/01/16 stable

## 2017-03-03 NOTE — Progress Notes (Addendum)
Office Visit Note  Patient: Allison Atkinson             Date of Birth: 1975-06-24           MRN: [id number]             PCP: Carylon Perches, MD Referring: Carylon Perches, MD Visit Date: 03/13/2017 Occupation: @GUAROCC @    Subjective:  SI joint pain   History of Present Illness: CARREEN MILIUS is a 42 y.o. female with history of ankylosing spondylitis and osteoarthritis.  Patient states that she is on Humira every other week.  She reports her dose is not lasting her a full two weeks.  She has been having increased breakthrough pain.  She states she was taking Celebrex for breakthrough pain.  Her LFTs were elevated with her recent labs.  She has now been taking Celebrex PRN and is having more pain.  She has also started to have pain in her bilateral hands.  She is having bilateral SI joint pain and stiffness in her lower back. She is having radiation of pain down the posterior aspect of her left thigh. She is also having pain in bilateral knees. She would like to reapply for Euflexa, her last injections were in April 2018.  Her knees swell if she stands for prolonged periods of time.  Patient states that she is in the process of trying to set up bariatric surgery.  She would like May 2018 to be aware and wondered if she can continue her medications. She does not have a scheduled surgery date at this time.    Activities of Daily Living:  Patient reports morning stiffness for 2  hours.   Patient Denies nocturnal pain.  Difficulty dressing/grooming: Denies Difficulty climbing stairs: Reports Difficulty getting out of chair: Reports Difficulty using hands for taps, buttons, cutlery, and/or writing: Reports   Review of Systems  Constitutional: Positive for fatigue. Negative for weakness.  HENT: Positive for mouth dryness. Negative for mouth sores and nose dryness.   Eyes: Negative for pain, redness, visual disturbance and dryness.  Respiratory: Negative for cough, hemoptysis, shortness of breath and  difficulty breathing.   Cardiovascular: Positive for palpitations and hypertension. Negative for chest pain, irregular heartbeat and swelling in legs/feet.  Gastrointestinal: Positive for diarrhea. Negative for blood in stool and constipation.  Endocrine: Negative for increased urination.  Genitourinary: Negative for painful urination.  Musculoskeletal: Positive for arthralgias, joint pain, joint swelling and morning stiffness. Negative for myalgias, muscle weakness, muscle tenderness and myalgias.  Skin: Positive for redness (on arms and chest). Negative for color change, pallor, rash, hair loss, nodules/bumps, skin tightness, ulcers and sensitivity to sunlight.  Allergic/Immunologic: Negative for susceptible to infections.  Neurological: Negative for dizziness, numbness and headaches.  Hematological: Negative for swollen glands.  Psychiatric/Behavioral: Positive for depressed mood and sleep disturbance. The patient is nervous/anxious.     PMFS History:  Patient Active Problem List   Diagnosis Date Noted  . High risk medication use 03/30/2016  . Acute midline low back pain without sciatica 03/30/2016  . Primary osteoarthritis of both knees 01/12/2016  . Spondylosis of lumbar region without myelopathy or radiculopathy 01/12/2016  . Sacroiliitis, not elsewhere classified (HCC) 01/12/2016  . Chronic diarrhea 01/12/2016  . History of gastroesophageal reflux (GERD) 01/12/2016  . History of fatty infiltration of liver 01/12/2016  . Hypertension 05/09/2013  . Palpitations 04/11/2013  . Spondyloarthropathy (HCC) 10/24/2009  . PCOS (polycystic ovarian syndrome) 01/25/2003  . Depression with anxiety 01/25/2003  .  GERD (gastroesophageal reflux disease) 01/25/2003  . Hypothyroidism 01/24/2001    Past Medical History:  Diagnosis Date  . Anxiety   . Bipolar disorder (HCC)   . Fatty liver disease, nonalcoholic   . History of cholecystectomy   . Hypertension   . Polycystic ovarian disease     . RA (rheumatoid arthritis) (HCC)   . Thyroid disease    hypothyroid    Family History  Problem Relation Age of Onset  . Hypertension Mother   . Alcohol abuse Father    Past Surgical History:  Procedure Laterality Date  . CHOLECYSTECTOMY  1999  . COLONOSCOPY W/ BIOPSIES  2010  . ESOPHAGOGASTRODUODENOSCOPY  2010  . KNEE ARTHROSCOPY Right    Social History   Social History Narrative  . Not on file     Objective: Vital Signs: BP 130/76 (BP Location: Left Arm, Patient Position: Sitting, Cuff Size: Normal)   Pulse 84   Resp 20   Ht 5\' 3"  (1.6 m)   Wt (!) 319 lb (144.7 kg)   BMI 56.51 kg/m    Physical Exam  Constitutional: She is oriented to person, place, and time. She appears well-developed and well-nourished.  HENT:  Head: Normocephalic and atraumatic.  Eyes: Conjunctivae and EOM are normal.  Neck: Normal range of motion.  Cardiovascular: Normal rate, regular rhythm, normal heart sounds and intact distal pulses.  Pulmonary/Chest: Effort normal and breath sounds normal.  Abdominal: Soft. Bowel sounds are normal.  Lymphadenopathy:    She has no cervical adenopathy.  Neurological: She is alert and oriented to person, place, and time.  Skin: Skin is warm and dry. Capillary refill takes less than 2 seconds.  Psychiatric: She has a normal mood and affect. Her behavior is normal.  Nursing note and vitals reviewed.    Musculoskeletal Exam: C-spine, thoracic, and lumbar spine good ROM.  Shoulder joints, elbow joints, wrist joints, MCPs, PIPs, and DIPs good ROM with no synovitis.  Hip joints, knee joints, ankle joints, MTPs, PIPs, and DIPs good ROM with no synovitis.  She has no trochanteric bursa tenderness.  She has bilateral SI joint tenderness.  She has discomfort with ROM of her bilateral knees.  No warmth or effusion of her knees.   CDAI Exam: No CDAI exam completed.    Investigation: No additional findings. CBC Latest Ref Rng & Units 03/08/2017 09/01/2016 07/06/2016   WBC 3.8 - 10.8 Thousand/uL 12.4(H) 11.1(H) 12.7(H)  Hemoglobin 11.7 - 15.5 g/dL 07/08/2016 91.6 94.5  Hematocrit 35.0 - 45.0 % 41.3 41.3 41.1  Platelets 140 - 400 Thousand/uL 333 357 366   CMP Latest Ref Rng & Units 03/08/2017 09/01/2016 07/06/2016  Glucose 65 - 99 mg/dL 86 84 07/08/2016)  BUN 7 - 25 mg/dL 7 10 7   Creatinine 0.50 - 1.10 mg/dL 882(C 0.03  Sodium 135 - 146 mmol/L 135 137 137  Potassium 3.5 - 5.3 mmol/L 4.2 4.1 4.4  Chloride 98 - 110 mmol/L 98 103 101  CO2 20 - 32 mmol/L 26 23 24   Calcium 8.6 - 10.2 mg/dL 9.7 9.3 9.4  Total Protein 6.1 - 8.1 g/dL 7.5 6.7 7.2  Total Bilirubin 0.2 - 1.2 mg/dL 0.4 0.3 0.3  Alkaline Phos 33 - 115 U/L - 56 68  AST 10 - 30 U/L 60(H) 41(H) 29  ALT 6 - 29 U/L 70(H) 57(H) 45(H)    Imaging: No results found.  Speciality Comments: No specialty comments available.    Procedures:  No procedures performed Allergies: Risperdal [risperidone]; Oxycodone-acetaminophen;  and Latex      Assessment / Plan:     Visit Diagnoses: Spondyloarthropathy (HCC): She has bilateral SI joint pain.  She has been on Humira every other week.  She states that she is having breakthrough pain before her next injection is due.  We discussed discontinuing Humira and trying Cosentyx.  We are going to apply for Cosentyx though her insurance.  She agrees with the current plan. The indications, contraindications, and side effects were discussed.  Consent was obtained and all questions were addressed.  She is due for her next Humira injection in 2 days, so she is going to administer her next dose Wednesday and then she is aware that she must wait two weeks before starting Cosentyx. She was given a sample of Humira for her injection Wednesday.  She has no history or family history of IBD.  She had a colonoscopy in 2017.     Medication counseling: TB Gold: 09/01/16 negative  Hepatitis panel: negative 10/15/08 HIV: Pending  SPEP: 10/15/08 Normal  Immunoglobulin: Pending  Does patient  have a history of inflammatory bowel disease? No  Counseled patient that Cosentyx is a IL-17 inhibitor that works to reduce pain and inflammation associated with arthritis.  Counseled patient on purpose, proper use, and adverse effects of Cosentyx. Reviewed the most common adverse effects of infection, inflammatory bowel disease, and allergic reaction.  Reviewed the importance of regular labs while on Cosentyx.  Counseled patient that Cosentyx should be held prior to scheduled surgery.  Counseled patient to avoid live vaccines while on Cosentyx.  Advised patient to get annual influenza vaccine and the pneumococcal vaccine as indicated.  Provided patient with medication education material and answered all questions.  Patient consented to Cosentyx.  Will upload consent into patient's chart.  Will apply for Cosentyx through patient's insurance.  Reviewed storage information for Cosentyx.  Advised initial injection must be administered in office.  Patient voiced understanding.    High risk medication use -She has been on Humira.  Her next injection is due in 2 days.  We are applying for Cosentyx.  She will require more frequent lab work after initiating Cosentyx.  HIV and immunoglobulins were ordered today. TB gold negative 09/01/16 - Plan: IgG, IgA, IgM, HIV antibody  Sacroiliitis, not elsewhere classified (HCC): She has tenderness of bilateral SI joints.    Spondylosis of lumbar region without myelopathy or radiculopathy: Chronic pain   Primary osteoarthritis of both knees: She has bilateral knee discomfort.  No warmth or effusion on exam.  She would like to reapply for Euflexxa.  Her last Euflexxa injections were in April 2018.   Other medical conditions are listed as follows:   PCOS (polycystic ovarian syndrome)  Essential hypertension  History of gastroesophageal reflux (GERD)  History of fatty infiltration of liver  Palpitations  Depression with anxiety    Orders: Orders Placed This  Encounter  Procedures  . IgG, IgA, IgM  . HIV antibody   No orders of the defined types were placed in this encounter.   Face-to-face time spent with patient was 30  minutes. Greater than 50% of time was spent in counseling and coordination of care.  Follow-Up Instructions: Return in about 3 months (around 06/10/2017) for Spondyloarthropathy and osteoarthritis.   [name], MD  Note - This record has been created using Animal nutritionist.  Chart creation errors have been sought, but may not always  have been located. Such creation errors do not reflect on  the standard of  medical care.

## 2017-03-08 ENCOUNTER — Other Ambulatory Visit: Payer: Self-pay | Admitting: *Deleted

## 2017-03-08 DIAGNOSIS — Z79899 Other long term (current) drug therapy: Secondary | ICD-10-CM

## 2017-03-08 LAB — CBC WITH DIFFERENTIAL/PLATELET
Basophils Absolute: 74 cells/uL (ref 0–200)
Basophils Relative: 0.6 %
EOS PCT: 1.4 %
Eosinophils Absolute: 174 cells/uL (ref 15–500)
HEMATOCRIT: 41.3 % (ref 35.0–45.0)
HEMOGLOBIN: 14.4 g/dL (ref 11.7–15.5)
LYMPHS ABS: 5022 {cells}/uL — AB (ref 850–3900)
MCH: 30 pg (ref 27.0–33.0)
MCHC: 34.9 g/dL (ref 32.0–36.0)
MCV: 86 fL (ref 80.0–100.0)
MPV: 10.3 fL (ref 7.5–12.5)
Monocytes Relative: 7.1 %
NEUTROS ABS: 6250 {cells}/uL (ref 1500–7800)
Neutrophils Relative %: 50.4 %
Platelets: 333 10*3/uL (ref 140–400)
RBC: 4.8 10*6/uL (ref 3.80–5.10)
RDW: 12.2 % (ref 11.0–15.0)
Total Lymphocyte: 40.5 %
WBC: 12.4 10*3/uL — ABNORMAL HIGH (ref 3.8–10.8)
WBCMIX: 880 {cells}/uL (ref 200–950)

## 2017-03-08 LAB — COMPLETE METABOLIC PANEL WITH GFR
AG Ratio: 1.6 (calc) (ref 1.0–2.5)
ALT: 70 U/L — AB (ref 6–29)
AST: 60 U/L — ABNORMAL HIGH (ref 10–30)
Albumin: 4.6 g/dL (ref 3.6–5.1)
Alkaline phosphatase (APISO): 70 U/L (ref 33–115)
BILIRUBIN TOTAL: 0.4 mg/dL (ref 0.2–1.2)
BUN: 7 mg/dL (ref 7–25)
CALCIUM: 9.7 mg/dL (ref 8.6–10.2)
CHLORIDE: 98 mmol/L (ref 98–110)
CO2: 26 mmol/L (ref 20–32)
CREATININE: 0.68 mg/dL (ref 0.50–1.10)
GFR, EST AFRICAN AMERICAN: 128 mL/min/{1.73_m2} (ref >=60)
GFR, EST NON AFRICAN AMERICAN: 110 mL/min/{1.73_m2} (ref >=60)
GLUCOSE: 86 mg/dL (ref 65–99)
Globulin: 2.9 g/dL (calc) (ref 1.9–3.7)
Potassium: 4.2 mmol/L (ref 3.5–5.3)
Sodium: 135 mmol/L (ref 135–146)
TOTAL PROTEIN: 7.5 g/dL (ref 6.1–8.1)

## 2017-03-13 ENCOUNTER — Encounter: Payer: Self-pay | Admitting: Physician Assistant

## 2017-03-13 ENCOUNTER — Telehealth: Payer: Self-pay

## 2017-03-13 ENCOUNTER — Ambulatory Visit (INDEPENDENT_AMBULATORY_CARE_PROVIDER_SITE_OTHER): Payer: BLUE CROSS/BLUE SHIELD | Admitting: Rheumatology

## 2017-03-13 VITALS — BP 130/76 | HR 84 | Resp 20 | Ht 63.0 in | Wt 319.0 lb

## 2017-03-13 DIAGNOSIS — R002 Palpitations: Secondary | ICD-10-CM | POA: Diagnosis not present

## 2017-03-13 DIAGNOSIS — F418 Other specified anxiety disorders: Secondary | ICD-10-CM | POA: Diagnosis not present

## 2017-03-13 DIAGNOSIS — I10 Essential (primary) hypertension: Secondary | ICD-10-CM | POA: Diagnosis not present

## 2017-03-13 DIAGNOSIS — M170 Bilateral primary osteoarthritis of knee: Secondary | ICD-10-CM | POA: Diagnosis not present

## 2017-03-13 DIAGNOSIS — M47816 Spondylosis without myelopathy or radiculopathy, lumbar region: Secondary | ICD-10-CM | POA: Diagnosis not present

## 2017-03-13 DIAGNOSIS — M4690 Unspecified inflammatory spondylopathy, site unspecified: Secondary | ICD-10-CM

## 2017-03-13 DIAGNOSIS — Z8719 Personal history of other diseases of the digestive system: Secondary | ICD-10-CM

## 2017-03-13 DIAGNOSIS — M461 Sacroiliitis, not elsewhere classified: Secondary | ICD-10-CM | POA: Diagnosis not present

## 2017-03-13 DIAGNOSIS — E282 Polycystic ovarian syndrome: Secondary | ICD-10-CM

## 2017-03-13 DIAGNOSIS — Z79899 Other long term (current) drug therapy: Secondary | ICD-10-CM

## 2017-03-13 DIAGNOSIS — M47819 Spondylosis without myelopathy or radiculopathy, site unspecified: Secondary | ICD-10-CM

## 2017-03-13 NOTE — Telephone Encounter (Addendum)
Was asked to submit a prior authorization for Cosentyx for pt. Authorization was submitted to pts insurance via cover my meds. Will update once we receive a response.   Kaylee Wombles, [place], CPhT 3:35 PM

## 2017-03-13 NOTE — Progress Notes (Signed)
Medication Samples have been provided to the patient.  Drug name: Humira      Strength: 40mg        Qty: 1 pen LOT:  Exp.Date: 04/2018  Dosing instructions: Inject 1 pen into the skin every 14 days.   The patient has been instructed regarding the correct time, dose, and frequency of taking this medication, including desired effects and most common side effects.   05/2018 3:44 PM 03/13/2017

## 2017-03-13 NOTE — Patient Instructions (Addendum)
Standing Labs We placed an order today for your standing lab work.    Please come back and get your standing labs in May and every 3 months  We have open lab Monday through Friday from 8:30-11:30 AM and 1:30-4 PM at the office of Dr. [name].   The office is located at [address], [place], [place] [address] No appointment is necessary.   Labs are drawn by [facility].  You may receive a bill from [facility] for your lab work. If you have any questions regarding directions or hours of operation,  please call [phone].      Secukinumab injection What is this medicine? SECUKINUMAB (sek ue KIN ue mab) is used to treat psoriasis. It is also used to treat psoriatic arthritis and ankylosing spondylitis. This medicine may be used for other purposes; ask your health care provider or pharmacist if you have questions. COMMON BRAND NAME(S): Cosentyx What should I tell my health care provider before I take this medicine? They need to know if you have any of these conditions: -Crohn's disease, ulcerative colitis, or other inflammatory bowel disease -infection or history of infection -other conditions affecting the immune system -recently received or are scheduled to receive a vaccine -tuberculosis, a positive skin test for tuberculosis, or have recently been in close contact with someone who has tuberculosis -an unusual or allergic reaction to secukinumab, other medicines, latex, rubber, foods, dyes, or preservatives -pregnant or trying to get pregnant -breast-feeding How should I use this medicine? This medicine is for injection under the skin. It may be administered by a healthcare professional in a hospital or clinic setting or at home. If you get this medicine at home, you will be taught how to prepare and give this medicine. Use exactly as directed. Take your medicine at regular intervals. Do not take your medicine more often than directed. It is important that you put your  used needles and syringes in a special sharps container. Do not put them in a trash can. If you do not have a sharps container, call your pharmacist or healthcare provider to get one. A special MedGuide will be given to you by the pharmacist with each prescription and refill. Be sure to read this information carefully each time. Talk to your pediatrician regarding the use of this medicine in children. Special care may be needed. Overdosage: If you think you have taken too much of this medicine contact a poison control center or emergency room at once. NOTE: This medicine is only for you. Do not share this medicine with others. What if I miss a dose? It is important not to miss your dose. Call your doctor of health care professional if you are unable to keep an appointment. If you give yourself the medicine and you miss a dose, take it as soon as you can. If it is almost time for your next dose, take only that dose. Do not take double or extra doses. What may interact with this medicine? Do not take this medicine with any of the following medications: -live virus vaccines This medicine may also interact with the following medications: -cyclosporine -inactivated vaccines -warfarin This list may not describe all possible interactions. Give your health care provider a list of all the medicines, herbs, non-prescription drugs, or dietary supplements you use. Also tell them if you smoke, drink alcohol, or use illegal drugs. Some items may interact with your medicine. What should I watch for while using this medicine? Tell your doctor or  healthcare professional if your symptoms do not start to get better or if they get worse. You will be tested for tuberculosis (TB) before you start this medicine. If your doctor prescribes any medicine for TB, you should start taking the TB medicine before starting this medicine. Make sure to finish the full course of TB medicine. Call your doctor or healthcare  professional for advice if you get a fever, chills or sore throat, or other symptoms of a cold or flu. Do not treat yourself. This drug decreases your body's ability to fight infections. Try to avoid being around people who are sick. This medicine can decrease the response to a vaccine. If you need to get vaccinated, tell your healthcare professional if you have received this medicine within the last 6 months. Extra booster doses may be needed. Talk to your doctor to see if a different vaccination schedule is needed. What side effects may I notice from receiving this medicine? Side effects that you should report to your doctor or health care professional as soon as possible: -allergic reactions like skin rash, itching or hives, swelling of the face, lips, or tongue -signs and symptoms of infection like fever or chills; cough; sore throat; pain or trouble passing urine Side effects that usually do not require medical attention (report to your doctor or health care professional if they continue or are bothersome): -diarrhea This list may not describe all possible side effects. Call your doctor for medical advice about side effects. You may report side effects to FDA at 1-800-FDA-1088. Where should I keep my medicine? Keep out of the reach of children. Store the prefilled syringe or injection pen in a refrigerator between 2 to 8 degrees C (36 to 46 degrees F). Keep the syringe or the pen in the original carton until ready for use. Protect from light. Do not freeze. Do not shake. Prior to use, remove the syringe or pen from the refrigerator and use within 1 hour. Throw away any unused medicine after the expiration date on the label. NOTE: This sheet is a summary. It may not cover all possible information. If you have questions about this medicine, talk to your doctor, pharmacist, or health care provider.  2018 Elsevier/Gold Standard (2015-02-12 11:48:31)

## 2017-03-14 LAB — IGG, IGA, IGM
IGG (IMMUNOGLOBIN G), SERUM: 1025 mg/dL (ref 694–1618)
IMMUNOGLOBULIN A: 114 mg/dL (ref 81–463)
IgM, Serum: 115 mg/dL (ref 48–271)

## 2017-03-14 LAB — HIV ANTIBODY (ROUTINE TESTING W REFLEX): HIV: NONREACTIVE

## 2017-03-14 NOTE — Progress Notes (Signed)
Labs WNL.

## 2017-03-14 NOTE — Telephone Encounter (Signed)
Received a fax from [facility] regarding a prior authorization approval for  from [place] (INCLUDING THE STARTER KIT) from 03/13/2017 to 01/23/2018.   Reference number:[id number] Phone number: NONE  Will send document to scan center.  Called pt to update. We will send Rx to [facility]. Pt was enolled in the co-pay card online over the phone. She will provide the pharmacy with that information once it's received in her email. If not, she will call [phone] to get the information from [employer]. Once we receive her medication from the pharmacy, we will contact her to schedule a nursing visit.   Please send Rx for Cosnetyx loading dose to [facility]. Thanks!   Tavon Magnussen 3:58 PM

## 2017-03-15 MED ORDER — SECUKINUMAB 150 MG/ML ~~LOC~~ SOAJ: 300.0000 mg | SUBCUTANEOUS | 0 refills | Status: DC

## 2017-03-15 NOTE — Telephone Encounter (Signed)
Prescription sent to the pharmacy and will schedule nurse visit once medication is received in the office.

## 2017-03-15 NOTE — Addendum Note (Signed)
Addended by: Henriette Combs on: 03/15/2017 08:40 AM   Modules accepted: Orders

## 2017-03-22 ENCOUNTER — Telehealth: Payer: Self-pay | Admitting: Rheumatology

## 2017-03-22 NOTE — Telephone Encounter (Signed)
Tom from [employer] called regarding prior authorization for Euflexxa.  He states that he sent a fax on Friday and wanted to make sure you had all the information you needed.  Please call #[phone]

## 2017-03-23 ENCOUNTER — Telehealth: Payer: Self-pay | Admitting: Rheumatology

## 2017-03-23 NOTE — Telephone Encounter (Signed)
I called [facility], no form received, pending fax form

## 2017-03-23 NOTE — Telephone Encounter (Signed)
Patient calling to see if [facility] has calling to set up shipment for Cosentyx. Please call to advise patient.

## 2017-03-24 NOTE — Telephone Encounter (Signed)
Called pharmacy to check on the status of Cosentyx Rx. Spoke with Misty Stanley who states the order is ready. Shipment will be delivered on Tuesday, March 5th. Will schedule nursing visit once shipment has been delivered.   Called pt to update. Left voicemail.  Tiffannie Sloss, [place], CPhT  9:22 AM

## 2017-03-24 NOTE — Telephone Encounter (Signed)
Left message to advise patient Cosentyx should be delivered 03/28/17 and will call to set up nurse visit when medication is received in the office.

## 2017-03-27 ENCOUNTER — Telehealth: Payer: Self-pay | Admitting: Rheumatology

## 2017-03-27 NOTE — Telephone Encounter (Signed)
Tom from [employer] called regarding prior authorization for patient's Euflexxa.  Please call [employer] to give verbal authorization at #[phone]

## 2017-03-29 ENCOUNTER — Telehealth: Payer: Self-pay | Admitting: Rheumatology

## 2017-03-29 ENCOUNTER — Telehealth: Payer: Self-pay

## 2017-03-29 NOTE — Telephone Encounter (Signed)
Cosentyx has bee received from [facility]. Medication has been placed in refrigerator. Please schedule nursing visit. Thanks!  Judithe Keetch, [place], CPhT 8:27 AM

## 2017-03-29 NOTE — Telephone Encounter (Signed)
Attempted to contact the patient and left message for patient to call the office.  

## 2017-03-29 NOTE — Telephone Encounter (Signed)
See previous phone note.  

## 2017-03-29 NOTE — Telephone Encounter (Signed)
Patient called stating that she was returning your call. °

## 2017-03-29 NOTE — Telephone Encounter (Signed)
Spoke with patient and she has been scheduled for 04/05/17 at 2 pm for nurse visit.

## 2017-03-30 DIAGNOSIS — E1165 Type 2 diabetes mellitus with hyperglycemia: Secondary | ICD-10-CM | POA: Insufficient documentation

## 2017-03-30 NOTE — Telephone Encounter (Signed)
Tom called from [employer] requesting that he receive a return call regarding prior authorization needed for Euflexxa.  He states you can call [employer] directly to give information at #[phone]

## 2017-04-05 ENCOUNTER — Ambulatory Visit (INDEPENDENT_AMBULATORY_CARE_PROVIDER_SITE_OTHER): Payer: BLUE CROSS/BLUE SHIELD | Admitting: *Deleted

## 2017-04-05 VITALS — BP 136/99 | HR 99

## 2017-04-05 DIAGNOSIS — M4690 Unspecified inflammatory spondylopathy, site unspecified: Secondary | ICD-10-CM | POA: Diagnosis not present

## 2017-04-05 DIAGNOSIS — M47819 Spondylosis without myelopathy or radiculopathy, site unspecified: Secondary | ICD-10-CM

## 2017-04-05 MED ORDER — SECUKINUMAB 150 MG/ML ~~LOC~~ SOAJ
300.0000 mg | Freq: Once | SUBCUTANEOUS | Status: AC
  Administered 2017-04-05: 300 mg via SUBCUTANEOUS

## 2017-04-05 NOTE — Patient Instructions (Signed)
Standing Labs We placed an order today for your standing lab work.    Please come back and get your standing labs in 1 month and every 3 months  We have open lab Monday through Friday from 8:30-11:30 AM and 1:30-4 PM at the office of Dr. [name].   The office is located at [address], [address], [place], [place] [address] No appointment is necessary.   Labs are drawn by [facility].  You may receive a bill from [facility] for your lab work. If you have any questions regarding directions or hours of operation,  please call [phone].     If you have a injection site reaction you may use hydrocortisone cream on the injection site. Then with the next injection use Zyrtec the day before the day of and the day after injection.

## 2017-04-05 NOTE — Progress Notes (Signed)
Patient in office for a new start to Cosentyx. Patient has previously been on Humira and last injection was about 3 weeks ago. Patient was given a demonstration on proper technique for self administration. Patient was able to demonstrate the proper technique. Patient provided medication and was given the medication in right and left lower abdomen. Patient tolerated injection well and was monitored in the office for 30 minutes after administration for adverse reactions.  Administrations This Visit    Secukinumab SOAJ 300 mg    Admin Date 04/05/2017 Action Given Dose 300 mg Route Subcutaneous Administered By Henriette Combs, LPN

## 2017-04-15 ENCOUNTER — Other Ambulatory Visit: Payer: Self-pay | Admitting: Rheumatology

## 2017-04-17 ENCOUNTER — Telehealth: Payer: Self-pay | Admitting: Rheumatology

## 2017-04-17 NOTE — Telephone Encounter (Addendum)
Left message for patient she needs to update TB Gold. Patient was recently started on the Cosentyx on 04/05/17 and does not need th refill yet.

## 2017-04-17 NOTE — Telephone Encounter (Signed)
Patient called returning your call

## 2017-04-19 NOTE — Telephone Encounter (Signed)
Attempted to contact patient and left message to advise patient she needs to update TB Gold.

## 2017-04-20 ENCOUNTER — Telehealth: Payer: Self-pay | Admitting: Rheumatology

## 2017-04-20 NOTE — Telephone Encounter (Signed)
Patient called stating that she is checking to see if her Euflexxa injections have been approved and if she can schedule an appointment.

## 2017-04-27 NOTE — Telephone Encounter (Signed)
Pending prior authorization

## 2017-05-15 ENCOUNTER — Telehealth: Payer: Self-pay | Admitting: Rheumatology

## 2017-05-15 NOTE — Telephone Encounter (Signed)
Patient called stating she called [facility] to check on her Euflexxa injections and they told her there is "nothing in their system regarding the medication.".  Patient states she is also requesting prescription refill of Cosentyx which she receives from [employer].

## 2017-05-16 MED ORDER — SECUKINUMAB 150 MG/ML ~~LOC~~ SOAJ: 300.0000 mg | SUBCUTANEOUS | 0 refills | Status: DC

## 2017-05-16 NOTE — Telephone Encounter (Signed)
Last visit: 03/13/17 Next visit: 05/30/17 Labs: 03/08/17 LFTs continue to rise.Please advise her to avoid NSAIDs, Tylenol, and alcohol. All other labs are stable. TB Gold: 01/05/16 Neg

## 2017-05-16 NOTE — Progress Notes (Signed)
Office Visit Note  Patient: Allison Atkinson             Date of Birth: 12/13/1975           MRN: [id number]             PCP: Carylon Perches, MD Referring: Carylon Perches, MD Visit Date: 05/30/2017 Occupation: @GUAROCC @    Subjective:  Right knee pain   History of Present Illness: Allison Atkinson is a 42 y.o. female with history of spondyloarthropathy and osteoarthritis.  Patient states that she has noticed improvement since starting on Cosentyx 300 mg subcutaneous once a month.  She is due for her next injection on May 10.  She is scheduled for bariatric surgery on July 04, 2017.  She states her pain in her lower back and SI joints has improved.  She states she continues to have stiffness for up to 2 hours in the morning though.  She denies any plantar fasciitis or Achilles tendinitis.  She denies any rashes.  She states she continues to have pain in bilateral knees worse in her right knee.  She states that she notices some swelling in her right knee as well.   Activities of Daily Living:  Patient reports morning stiffness for 2 hours.   Patient Denies nocturnal pain.  Difficulty dressing/grooming: Denies Difficulty climbing stairs: Reports Difficulty getting out of chair: Reports Difficulty using hands for taps, buttons, cutlery, and/or writing: Denies   Review of Systems  Constitutional: Positive for fatigue.  HENT: Negative for mouth sores, mouth dryness and nose dryness.   Eyes: Negative for pain, visual disturbance and dryness.  Respiratory: Positive for cough. Negative for hemoptysis, shortness of breath and difficulty breathing.   Cardiovascular: Negative for chest pain, palpitations, hypertension and swelling in legs/feet.  Gastrointestinal: Negative for blood in stool, constipation and diarrhea.  Endocrine: Negative for increased urination.  Genitourinary: Negative for painful urination.  Musculoskeletal: Positive for arthralgias, joint pain and morning stiffness. Negative for  joint swelling, myalgias, muscle weakness, muscle tenderness and myalgias.  Skin: Negative for color change, pallor, rash, hair loss, nodules/bumps, skin tightness, ulcers and sensitivity to sunlight.  Allergic/Immunologic: Negative for susceptible to infections.  Neurological: Negative for dizziness, numbness, headaches and weakness.  Hematological: Negative for swollen glands.  Psychiatric/Behavioral: Positive for depressed mood and sleep disturbance. The patient is nervous/anxious.     PMFS History:  Patient Active Problem List   Diagnosis Date Noted  . High risk medication use 03/30/2016  . Acute midline low back pain without sciatica 03/30/2016  . Primary osteoarthritis of both knees 01/12/2016  . Spondylosis of lumbar region without myelopathy or radiculopathy 01/12/2016  . Sacroiliitis, not elsewhere classified (HCC) 01/12/2016  . Chronic diarrhea 01/12/2016  . History of gastroesophageal reflux (GERD) 01/12/2016  . History of fatty infiltration of liver 01/12/2016  . Hypertension 05/09/2013  . Palpitations 04/11/2013  . Spondyloarthropathy (HCC) 10/24/2009  . PCOS (polycystic ovarian syndrome) 01/25/2003  . Depression with anxiety 01/25/2003  . GERD (gastroesophageal reflux disease) 01/25/2003  . Hypothyroidism 01/24/2001    Past Medical History:  Diagnosis Date  . Anxiety   . Bipolar disorder (HCC)   . Fatty liver disease, nonalcoholic   . History of cholecystectomy   . Hypertension   . Polycystic ovarian disease   . RA (rheumatoid arthritis) (HCC)   . Thyroid disease    hypothyroid    Family History  Problem Relation Age of Onset  . Hypertension Mother   . Alcohol abuse  Father    Past Surgical History:  Procedure Laterality Date  . CHOLECYSTECTOMY  1999  . COLONOSCOPY W/ BIOPSIES  2010  . ESOPHAGOGASTRODUODENOSCOPY  2010  . KNEE ARTHROSCOPY Right    Social History   Social History Narrative  . Not on file     Objective: Vital Signs: BP 104/72 (BP  Location: Left Arm, Patient Position: Sitting, Cuff Size: Normal)   Pulse 76   Resp 18   Ht 5\' 3"  (1.6 m)   Wt (!) 310 lb (140.6 kg)   BMI 54.91 kg/m    Physical Exam  Constitutional: She is oriented to person, place, and time. She appears well-developed and well-nourished.  HENT:  Head: Normocephalic and atraumatic.  Eyes: Conjunctivae and EOM are normal.  Neck: Normal range of motion.  Cardiovascular: Normal rate, regular rhythm, normal heart sounds and intact distal pulses.  Pulmonary/Chest: Effort normal and breath sounds normal.  Abdominal: Soft. Bowel sounds are normal.  Lymphadenopathy:    She has no cervical adenopathy.  Neurological: She is alert and oriented to person, place, and time.  Skin: Skin is warm and dry. Capillary refill takes less than 2 seconds.  Psychiatric: She has a normal mood and affect. Her behavior is normal.  Nursing note and vitals reviewed.    Musculoskeletal Exam: C-spine, thoracic, and lumbar spine good ROM.  No midline spinal tenderness.  No SI joint tenderness. Shoulder joints, elbow joints, wrist joints, MCPs, PIPs, and DIPs good ROM with no synovitis.  Hip joints, knee joints, ankle joints, MTPs, PIPs, and DIPs good ROM with no synovitis.  No warmth or effusion of knee joints.  No tenderness of trochanteric bursa.   CDAI Exam: No CDAI exam completed.    Investigation: No additional findings. CBC Latest Ref Rng & Units 05/17/2017 03/08/2017 09/01/2016  WBC 3.8 - 10.8 Thousand/uL 12.1(H) 12.4(H) 11.1(H)  Hemoglobin 11.7 - 15.5 g/dL 80.9 98.3 38.2  Hematocrit 35.0 - 45.0 % 40.9 41.3 41.3  Platelets 140 - 400 Thousand/uL 343 333 357   CMP Latest Ref Rng & Units 05/17/2017 03/08/2017 09/01/2016  Glucose 65 - 99 mg/dL 505(L) 86 84  BUN 7 - 25 mg/dL 7 7 10   Creatinine 0.50 - 1.10 mg/dL 9.76 7.34 1.93  Sodium 135 - 146 mmol/L 135 135 137  Potassium 3.5 - 5.3 mmol/L 4.4 4.2 4.1  Chloride 98 - 110 mmol/L 102 98 103  CO2 20 - 32 mmol/L 24 26 23     Calcium 8.6 - 10.2 mg/dL 9.5 9.7 9.3  Total Protein 6.1 - 8.1 g/dL 7.2 7.5 6.7  Total Bilirubin 0.2 - 1.2 mg/dL 0.5 0.4 0.3  Alkaline Phos 33 - 115 U/L - - 56  AST 10 - 30 U/L 55(H) 60(H) 41(H)  ALT 6 - 29 U/L 64(H) 70(H) 57(H)    Imaging: No results found.  Speciality Comments: No specialty comments available.    Procedures: Synvisc One Right knee, patient purchased Large Joint Inj: R knee on 05/30/2017 3:53 PM Indications: pain Details: 22 G 1.5 in needle, medial approach  Arthrogram: No  Medications: 48 mg Hylan 48 MG/6ML; 1.5 mL lidocaine 1 % Aspirate: 0 mL Outcome: tolerated well, no immediate complications Procedure, treatment alternatives, risks and benefits explained, specific risks discussed. Consent was given by the patient. Immediately prior to procedure a time out was called to verify the correct patient, procedure, equipment, support staff and site/side marked as required. Patient was prepped and draped in the usual sterile fashion.     Allergies:  Risperdal [risperidone]; Oxycodone-acetaminophen; and Latex   Assessment / Plan:     Visit Diagnoses: Spondyloarthropathy [facility]): Her bilateral SI joint pain has improved since starting on consent takes 300 mg subcutaneous once a month injections.  She previously had an inadequate response to Humira.  She is due for her next dose of Cosentyx on Jun 02, 2017.  She has an upcoming bariatric surgery that is on July 04, 2017.  She was advised to hold her June dose until she is cleared by her surgeon.  High risk medication use - Cosentyx 300 mg subq once a monthTB gold: 8/9/18CBC and CMP: 03/08/17.  CBC and CMP will be due in June and every 3 months.  Sacroiliitis, not elsewhere classified [facility]): She has no SI joint tenderness on exam today.  Spondylosis of lumbar region without myelopathy or radiculopathy: No midline spinal tenderness.  She is good range of motion on exam.  Primary osteoarthritis of both knees: No warmth or  effusion of the joints.  A right knee Synvisc injection was performed today.  She will return next week for her left knee injection.  She was warned of the risk of a pseudo-septic response.  Other medical conditions are listed as follows:  History of fatty infiltration of liver  History of gastroesophageal reflux (GERD)  Depression with anxiety  PCOS (polycystic ovarian syndrome)  Hypothyroidism, unspecified type  Essential hypertension    Orders: Orders Placed This Encounter  Procedures  . Large Joint Inj: R knee   No orders of the defined types were placed in this encounter.    Follow-Up Instructions: Return in about 5 months (around 10/30/2017) for Spondyloarthropathy, Osteoarthritis.   Gearldine Bienenstock, PA-C  Note - This record has been created using Dragon software.  Chart creation errors have been sought, but may not always  have been located. Such creation errors do not reflect on  the standard of medical care.

## 2017-05-16 NOTE — Telephone Encounter (Signed)
Patient advised she is due to update her tb Gold. Patient is coming 05/17/17 to update.   Okay to refill 30 day supply per Dr. Corliss Skains

## 2017-05-17 ENCOUNTER — Other Ambulatory Visit: Payer: Self-pay

## 2017-05-17 ENCOUNTER — Other Ambulatory Visit: Payer: Self-pay | Admitting: *Deleted

## 2017-05-17 DIAGNOSIS — R002 Palpitations: Secondary | ICD-10-CM | POA: Diagnosis not present

## 2017-05-17 DIAGNOSIS — Z79899 Other long term (current) drug therapy: Secondary | ICD-10-CM | POA: Diagnosis not present

## 2017-05-17 DIAGNOSIS — M4690 Unspecified inflammatory spondylopathy, site unspecified: Secondary | ICD-10-CM | POA: Diagnosis not present

## 2017-05-17 DIAGNOSIS — M47816 Spondylosis without myelopathy or radiculopathy, lumbar region: Secondary | ICD-10-CM | POA: Diagnosis not present

## 2017-05-17 DIAGNOSIS — Z9225 Personal history of immunosupression therapy: Secondary | ICD-10-CM

## 2017-05-17 DIAGNOSIS — I10 Essential (primary) hypertension: Secondary | ICD-10-CM | POA: Diagnosis not present

## 2017-05-17 DIAGNOSIS — M170 Bilateral primary osteoarthritis of knee: Secondary | ICD-10-CM | POA: Diagnosis not present

## 2017-05-17 DIAGNOSIS — Z8719 Personal history of other diseases of the digestive system: Secondary | ICD-10-CM | POA: Diagnosis not present

## 2017-05-17 DIAGNOSIS — M461 Sacroiliitis, not elsewhere classified: Secondary | ICD-10-CM | POA: Diagnosis not present

## 2017-05-17 DIAGNOSIS — E282 Polycystic ovarian syndrome: Secondary | ICD-10-CM | POA: Diagnosis not present

## 2017-05-17 DIAGNOSIS — F418 Other specified anxiety disorders: Secondary | ICD-10-CM | POA: Diagnosis not present

## 2017-05-17 LAB — CBC WITH DIFFERENTIAL/PLATELET
Basophils Absolute: 85 cells/uL (ref 0–200)
Basophils Relative: 0.7 %
Eosinophils Absolute: 278 cells/uL (ref 15–500)
Eosinophils Relative: 2.3 %
HCT: 40.9 % (ref 35.0–45.0)
Hemoglobin: 14.3 g/dL (ref 11.7–15.5)
Lymphs Abs: 4719 cells/uL — ABNORMAL HIGH (ref 850–3900)
MCH: 30 pg (ref 27.0–33.0)
MCHC: 35 g/dL (ref 32.0–36.0)
MCV: 85.7 fL (ref 80.0–100.0)
MONOS PCT: 7.2 %
MPV: 10.3 fL (ref 7.5–12.5)
Neutro Abs: 6147 cells/uL (ref 1500–7800)
Neutrophils Relative %: 50.8 %
PLATELETS: 343 10*3/uL (ref 140–400)
RBC: 4.77 10*6/uL (ref 3.80–5.10)
RDW: 13.1 % (ref 11.0–15.0)
TOTAL LYMPHOCYTE: 39 %
WBC mixed population: 871 cells/uL (ref 200–950)
WBC: 12.1 10*3/uL — AB (ref 3.8–10.8)

## 2017-05-17 LAB — COMPLETE METABOLIC PANEL WITH GFR
AG RATIO: 1.8 (calc) (ref 1.0–2.5)
ALKALINE PHOSPHATASE (APISO): 66 U/L (ref 33–115)
ALT: 64 U/L — ABNORMAL HIGH (ref 6–29)
AST: 55 U/L — AB (ref 10–30)
Albumin: 4.6 g/dL (ref 3.6–5.1)
BUN: 7 mg/dL (ref 7–25)
CO2: 24 mmol/L (ref 20–32)
CREATININE: 0.68 mg/dL (ref 0.50–1.10)
Calcium: 9.5 mg/dL (ref 8.6–10.2)
Chloride: 102 mmol/L (ref 98–110)
GFR, Est African American: 127 mL/min/{1.73_m2} (ref >=60)
GFR, Est Non African American: 109 mL/min/{1.73_m2} (ref >=60)
Globulin: 2.6 g/dL (calc) (ref 1.9–3.7)
Glucose, Bld: 156 mg/dL — ABNORMAL HIGH (ref 65–99)
POTASSIUM: 4.4 mmol/L (ref 3.5–5.3)
SODIUM: 135 mmol/L (ref 135–146)
Total Bilirubin: 0.5 mg/dL (ref 0.2–1.2)
Total Protein: 7.2 g/dL (ref 6.1–8.1)

## 2017-05-17 NOTE — Telephone Encounter (Signed)
I called patient, pending shipment, PA faxed to [facility].

## 2017-05-18 NOTE — Progress Notes (Signed)
LFTs are better but is still elevated.  She should avoid all NSAIDs.

## 2017-05-19 ENCOUNTER — Telehealth: Payer: Self-pay | Admitting: Rheumatology

## 2017-05-19 LAB — QUANTIFERON-TB GOLD PLUS
Mitogen-NIL: >10 IU/mL
NIL: 0.03 IU/mL
QuantiFERON-TB Gold Plus: NEGATIVE
TB1-NIL: <0 IU/mL
TB2-NIL: <0 IU/mL

## 2017-05-19 NOTE — Telephone Encounter (Signed)
LMOM, [facility] stated patient is enrolling in co-pay assistance program for Synvisc One which doesn't need a PA, Euflexxa is not the preferred product.

## 2017-05-19 NOTE — Telephone Encounter (Signed)
Patient left a voicemail stating that she spoke with her insurance company today and they told her that don't have a prior authorization on file for her knee injections.

## 2017-05-24 DIAGNOSIS — E119 Type 2 diabetes mellitus without complications: Secondary | ICD-10-CM

## 2017-05-24 HISTORY — DX: Type 2 diabetes mellitus without complications: E11.9

## 2017-05-29 DIAGNOSIS — Z68.41 Body mass index (BMI) pediatric, 85th percentile to less than 95th percentile for age: Secondary | ICD-10-CM | POA: Diagnosis not present

## 2017-05-29 DIAGNOSIS — G5601 Carpal tunnel syndrome, right upper limb: Secondary | ICD-10-CM | POA: Diagnosis not present

## 2017-05-29 DIAGNOSIS — S0502XA Injury of conjunctiva and corneal abrasion without foreign body, left eye, initial encounter: Secondary | ICD-10-CM | POA: Diagnosis not present

## 2017-05-30 ENCOUNTER — Encounter: Payer: Self-pay | Admitting: Physician Assistant

## 2017-05-30 ENCOUNTER — Ambulatory Visit (INDEPENDENT_AMBULATORY_CARE_PROVIDER_SITE_OTHER): Payer: Medicare Other | Admitting: Physician Assistant

## 2017-05-30 VITALS — BP 104/72 | HR 76 | Resp 18 | Ht 63.0 in | Wt 310.0 lb

## 2017-05-30 DIAGNOSIS — M461 Sacroiliitis, not elsewhere classified: Secondary | ICD-10-CM

## 2017-05-30 DIAGNOSIS — F418 Other specified anxiety disorders: Secondary | ICD-10-CM

## 2017-05-30 DIAGNOSIS — M1711 Unilateral primary osteoarthritis, right knee: Secondary | ICD-10-CM | POA: Diagnosis not present

## 2017-05-30 DIAGNOSIS — M170 Bilateral primary osteoarthritis of knee: Secondary | ICD-10-CM | POA: Diagnosis not present

## 2017-05-30 DIAGNOSIS — M4690 Unspecified inflammatory spondylopathy, site unspecified: Secondary | ICD-10-CM | POA: Diagnosis not present

## 2017-05-30 DIAGNOSIS — Z79899 Other long term (current) drug therapy: Secondary | ICD-10-CM | POA: Diagnosis not present

## 2017-05-30 DIAGNOSIS — M47816 Spondylosis without myelopathy or radiculopathy, lumbar region: Secondary | ICD-10-CM | POA: Diagnosis not present

## 2017-05-30 DIAGNOSIS — I10 Essential (primary) hypertension: Secondary | ICD-10-CM | POA: Diagnosis not present

## 2017-05-30 DIAGNOSIS — E039 Hypothyroidism, unspecified: Secondary | ICD-10-CM

## 2017-05-30 DIAGNOSIS — M47819 Spondylosis without myelopathy or radiculopathy, site unspecified: Secondary | ICD-10-CM

## 2017-05-30 DIAGNOSIS — Z8719 Personal history of other diseases of the digestive system: Secondary | ICD-10-CM | POA: Diagnosis not present

## 2017-05-30 DIAGNOSIS — E282 Polycystic ovarian syndrome: Secondary | ICD-10-CM

## 2017-05-30 MED ORDER — HYLAN G-F 20 48 MG/6ML IX SOSY
48.0000 mg | PREFILLED_SYRINGE | INTRA_ARTICULAR | Status: AC | PRN
  Administered 2017-05-30: 48 mg via INTRA_ARTICULAR

## 2017-05-30 MED ORDER — LIDOCAINE HCL 1 % IJ SOLN
1.5000 mL | INTRAMUSCULAR | Status: AC | PRN
  Administered 2017-05-30: 1.5 mL

## 2017-05-30 NOTE — Patient Instructions (Signed)
Standing Labs We placed an order today for your standing lab work.    Please come back and get your standing labs in June and every 3 months  We have open lab Monday through Friday from 8:30-11:30 AM and 1:30-4:00 PM  at the office of Dr. Shaili Deveshwar.   You may experience shorter wait times on Monday and Friday afternoons. The office is located at [address], [address], [place], [place] [address] No appointment is necessary.   Labs are drawn by [facility].  You may receive a bill from [facility] for your lab work. If you have any questions regarding directions or hours of operation,  please call [phone].    

## 2017-06-01 ENCOUNTER — Other Ambulatory Visit: Payer: Self-pay | Admitting: Rheumatology

## 2017-06-02 NOTE — Telephone Encounter (Signed)
Last Visit: 05/30/17 Next Visit: 10/31/17  Okay to refill per Dr. Corliss Skains

## 2017-06-05 ENCOUNTER — Encounter: Payer: Medicare Other | Attending: General Surgery | Admitting: Skilled Nursing Facility1

## 2017-06-05 DIAGNOSIS — Z6841 Body Mass Index (BMI) 40.0 and over, adult: Secondary | ICD-10-CM | POA: Diagnosis not present

## 2017-06-05 DIAGNOSIS — Z713 Dietary counseling and surveillance: Secondary | ICD-10-CM | POA: Insufficient documentation

## 2017-06-05 DIAGNOSIS — E669 Obesity, unspecified: Secondary | ICD-10-CM

## 2017-06-06 ENCOUNTER — Ambulatory Visit (INDEPENDENT_AMBULATORY_CARE_PROVIDER_SITE_OTHER): Payer: Medicare Other | Admitting: Physician Assistant

## 2017-06-06 ENCOUNTER — Encounter: Payer: Self-pay | Admitting: Skilled Nursing Facility1

## 2017-06-06 DIAGNOSIS — M1712 Unilateral primary osteoarthritis, left knee: Secondary | ICD-10-CM

## 2017-06-06 MED ORDER — HYLAN G-F 20 48 MG/6ML IX SOSY
48.0000 mg | PREFILLED_SYRINGE | INTRA_ARTICULAR | Status: AC | PRN
  Administered 2017-06-06: 48 mg via INTRA_ARTICULAR

## 2017-06-06 MED ORDER — LIDOCAINE HCL 1 % IJ SOLN
1.5000 mL | INTRAMUSCULAR | Status: AC | PRN
  Administered 2017-06-06: 1.5 mL

## 2017-06-06 NOTE — Progress Notes (Signed)
Pre-Operative Nutrition Class:  Appt start time: 9558   End time:  1830.  Patient was seen on 06/05/2017 for Pre-Operative Bariatric Surgery Education at [facility].   Surgery date:  Surgery type: RYGB Start weight at [facility]: 310 Weight today: 305.1  Samples given per MNT protocol. Patient educated on appropriate usage: Bariatric Advantage Multivitamin Lot # [id number] Exp: 07/19  Bariatric Advantage Calcium  Lot # [id number] Exp: sep-08-2017  Unjury Protein  Shake Lot # [id number] Exp: oct-19-19  The following the learning objectives were met by the patient during this course:  Identify Pre-Op Dietary Goals and will begin 2 weeks pre-operatively  Identify appropriate sources of fluids and proteins   State protein recommendations and appropriate sources pre and post-operatively  Identify Post-Operative Dietary Goals and will follow for 2 weeks post-operatively  Identify appropriate multivitamin and calcium sources  Describe the need for physical activity post-operatively and will follow MD recommendations  State when to call healthcare provider regarding medication questions or post-operative complications  Handouts given during class include:  Pre-Op Bariatric Surgery Diet Handout  Protein Shake Handout  Post-Op Bariatric Surgery Nutrition Handout  BELT Program Information Flyer  Support Group Information Flyer  [facility] Bariatric Supplements Price List  [facility]: [facility] per MD.

## 2017-06-06 NOTE — Progress Notes (Signed)
   Procedure Note  Patient: Allison Atkinson             Date of Birth: August 03, 1975           MRN: [id number]             Visit Date: 06/06/2017  Procedures: Visit Diagnoses: Primary osteoarthritis of left knee  Large Joint Inj: L knee on 06/06/2017 2:41 PM Indications: pain Details: 27 G 1.5 in needle, medial approach  Arthrogram: No  Medications: 1.5 mL lidocaine 1 %; 48 mg Hylan 48 MG/6ML Aspirate: 0 mL Outcome: tolerated well, no immediate complications Procedure, treatment alternatives, risks and benefits explained, specific risks discussed. Consent was given by the patient. Immediately prior to procedure a time out was called to verify the correct patient, procedure, equipment, support staff and site/side marked as required. Patient was prepped and draped in the usual sterile fashion.     Patient tolerated the procedure well.    Sherron Ales, PA-C

## 2017-06-16 ENCOUNTER — Other Ambulatory Visit: Payer: Self-pay | Admitting: Rheumatology

## 2017-06-16 NOTE — Telephone Encounter (Signed)
Last Visit: 05/30/17 Next Visit: 10/31/17 Labs: 05/17/17 LFTs are better but is still elevated. She should avoid all NSAIDs. TB Gold: 05/17/17 Neg   Okay to refill per Dr. Corliss Skains

## 2017-06-21 DIAGNOSIS — E669 Obesity, unspecified: Secondary | ICD-10-CM | POA: Diagnosis not present

## 2017-06-21 DIAGNOSIS — E1169 Type 2 diabetes mellitus with other specified complication: Secondary | ICD-10-CM | POA: Diagnosis not present

## 2017-06-21 DIAGNOSIS — I10 Essential (primary) hypertension: Secondary | ICD-10-CM | POA: Diagnosis not present

## 2017-06-26 DIAGNOSIS — Z6841 Body Mass Index (BMI) 40.0 and over, adult: Secondary | ICD-10-CM | POA: Diagnosis not present

## 2017-06-26 DIAGNOSIS — Z1231 Encounter for screening mammogram for malignant neoplasm of breast: Secondary | ICD-10-CM | POA: Diagnosis not present

## 2017-06-26 DIAGNOSIS — Z124 Encounter for screening for malignant neoplasm of cervix: Secondary | ICD-10-CM | POA: Diagnosis not present

## 2017-06-26 DIAGNOSIS — Z01419 Encounter for gynecological examination (general) (routine) without abnormal findings: Secondary | ICD-10-CM | POA: Diagnosis not present

## 2017-06-27 NOTE — Progress Notes (Signed)
10-05-16 (Epic) EKG, CXR

## 2017-06-27 NOTE — Patient Instructions (Addendum)
Allison Atkinson  06/27/2017   Your procedure is scheduled on: 07-04-17   Report to [facility] Main  Entrance    Report to Admitting at 5:30 AM    Call this number if you have problems the morning of surgery [phone]   Remember: Do not eat food or drink liquids :After Midnight.     Take these medicines the morning of surgery with A SIP OF WATER: Duloxetine (Cymbalta), and Synthroid (Levothyroxine).                                 You may not have any metal on your body including hair pins and              piercings  Do not wear jewelry, make-up, lotions, powders or perfumes, deodorant             Do not wear nail polish.  Do not shave  48 hours prior to surgery.               Do not bring valuables to the hospital. [facility] IS NOT             RESPONSIBLE   FOR VALUABLES.  Contacts, dentures or bridgework may not be worn into surgery.  Leave suitcase in the car. After surgery it may be brought to your room.      Special Instructions: Please bring your mask and tubing for your CPAP machine              Please read over the following fact sheets you were given: _____________________________________________________________________   How to Manage Your Diabetes Before and After Surgery  Why is it important to control my blood sugar before and after surgery? . Improving blood sugar levels before and after surgery helps healing and can limit problems. . A way of improving blood sugar control is eating a healthy diet by: o  Eating less sugar and carbohydrates o  Increasing activity/exercise o  Talking with your doctor about reaching your blood sugar goals . High blood sugars (greater than 180 mg/dL) can raise your risk of infections and slow your recovery, so you will need to focus on controlling your diabetes during the weeks before surgery. . Make sure that the doctor who takes care of your diabetes knows about your planned surgery including the date  and location.  How do I manage my blood sugar before surgery? . Check your blood sugar at least 4 times a day, starting 2 days before surgery, to make sure that the level is not too high or low. o Check your blood sugar the morning of your surgery when you wake up and every 2 hours until you get to the Short Stay unit. . If your blood sugar is less than 70 mg/dL, you will need to treat for low blood sugar: o Do not take insulin. o Treat a low blood sugar (less than 70 mg/dL) with  cup of clear juice (cranberry or apple), 4 glucose tablets, OR glucose gel. o Recheck blood sugar in 15 minutes after treatment (to make sure it is greater than 70 mg/dL). If your blood sugar is not greater than 70 mg/dL on recheck, call [phone] for further instructions. . Report your blood sugar to the short stay nurse when you get to Short Stay.  . If  you are admitted to the hospital after surgery: o Your blood sugar will be checked by the staff and you will probably be given insulin after surgery (instead of oral diabetes medicines) to make sure you have good blood sugar levels. o The goal for blood sugar control after surgery is 80-180 mg/dL.   WHAT DO I DO ABOUT MY DIABETES MEDICATION?  Marland Kitchen Do not take oral diabetes medicines (pills) the morning of surgery.  . THE DAY BEFORE SURGERY, take your usual dose of Metformin       [facility] - Preparing for Surgery Before surgery, you can play an important role.  Because skin is not sterile, your skin needs to be as free of germs as possible.  You can reduce the number of germs on your skin by washing with CHG (chlorahexidine gluconate) soap before surgery.  CHG is an antiseptic cleaner which kills germs and bonds with the skin to continue killing germs even after washing. Please DO NOT use if you have an allergy to CHG or antibacterial soaps.  If your skin becomes reddened/irritated stop using the CHG and inform your nurse when you arrive at Short Stay. Do not  shave (including legs and underarms) for at least 48 hours prior to the first CHG shower.  You may shave your face/neck. Please follow these instructions carefully:  1.  Shower with CHG Soap the night before surgery and the  morning of Surgery.  2.  If you choose to wash your hair, wash your hair first as usual with your  normal  shampoo.  3.  After you shampoo, rinse your hair and body thoroughly to remove the  shampoo.                           4.  Use CHG as you would any other liquid soap.  You can apply chg directly  to the skin and wash                       Gently with a scrungie or clean washcloth.  5.  Apply the CHG Soap to your body ONLY FROM THE NECK DOWN.   Do not use on face/ open                           Wound or open sores. Avoid contact with eyes, ears mouth and genitals (private parts).                       Wash face,  Genitals (private parts) with your normal soap.             6.  Wash thoroughly, paying special attention to the area where your surgery  will be performed.  7.  Thoroughly rinse your body with warm water from the neck down.  8.  DO NOT shower/wash with your normal soap after using and rinsing off  the CHG Soap.                9.  Pat yourself dry with a clean towel.            10.  Wear clean pajamas.            11.  Place clean sheets on your bed the night of your first shower and do not  sleep with pets. Day of Surgery : Do not apply  any lotions/deodorants the morning of surgery.  Please wear clean clothes to the hospital/surgery center.  FAILURE TO FOLLOW THESE INSTRUCTIONS MAY RESULT IN THE CANCELLATION OF YOUR SURGERY PATIENT SIGNATURE_________________________________  NURSE SIGNATURE__________________________________  ________________________________________________________________________  WHAT IS A BLOOD TRANSFUSION? Blood Transfusion Information  A transfusion is the replacement of blood or some of its parts. Blood is made up of multiple cells  which provide different functions.  Red blood cells carry oxygen and are used for blood loss replacement.  White blood cells fight against infection.  Platelets control bleeding.  Plasma helps clot blood.  Other blood products are available for specialized needs, such as hemophilia or other clotting disorders. BEFORE THE TRANSFUSION  Who gives blood for transfusions?   Healthy volunteers who are fully evaluated to make sure their blood is safe. This is blood bank blood. Transfusion therapy is the safest it has ever been in the practice of medicine. Before blood is taken from a donor, a complete history is taken to make sure that person has no history of diseases nor engages in risky social behavior (examples are intravenous drug use or sexual activity with multiple partners). The donor's travel history is screened to minimize risk of transmitting infections, such as malaria. The donated blood is tested for signs of infectious diseases, such as HIV and hepatitis. The blood is then tested to be sure it is compatible with you in order to minimize the chance of a transfusion reaction. If you or a relative donates blood, this is often done in anticipation of surgery and is not appropriate for emergency situations. It takes many days to process the donated blood. RISKS AND COMPLICATIONS Although transfusion therapy is very safe and saves many lives, the main dangers of transfusion include:   Getting an infectious disease.  Developing a transfusion reaction. This is an allergic reaction to something in the blood you were given. Every precaution is taken to prevent this. The decision to have a blood transfusion has been considered carefully by your caregiver before blood is given. Blood is not given unless the benefits outweigh the risks. AFTER THE TRANSFUSION  Right after receiving a blood transfusion, you will usually feel much better and more energetic. This is especially true if your red blood  cells have gotten low (anemic). The transfusion raises the level of the red blood cells which carry oxygen, and this usually causes an energy increase.  The nurse administering the transfusion will monitor you carefully for complications. HOME CARE INSTRUCTIONS  No special instructions are needed after a transfusion. You may find your energy is better. Speak with your caregiver about any limitations on activity for underlying diseases you may have. SEEK MEDICAL CARE IF:   Your condition is not improving after your transfusion.  You develop redness or irritation at the intravenous (IV) site. SEEK IMMEDIATE MEDICAL CARE IF:  Any of the following symptoms occur over the next 12 hours:  Shaking chills.  You have a temperature by mouth above 102 F (38.9 C), not controlled by medicine.  Chest, back, or muscle pain.  People around you feel you are not acting correctly or are confused.  Shortness of breath or difficulty breathing.  Dizziness and fainting.  You get a rash or develop hives.  You have a decrease in urine output.  Your urine turns a dark color or changes to pink, red, or brown. Any of the following symptoms occur over the next 10 days:  You have a temperature by mouth above 102 F (  38.9 C), not controlled by medicine.  Shortness of breath.  Weakness after normal activity.  The white part of the eye turns yellow (jaundice).  You have a decrease in the amount of urine or are urinating less often.  Your urine turns a dark color or changes to pink, red, or brown. Document Released: 01/08/2000 Document Revised: 04/04/2011 Document Reviewed: 08/27/2007 [facility] Patient Information 2014 [place], [place].  _______________________________________________________________________

## 2017-06-29 ENCOUNTER — Other Ambulatory Visit: Payer: Self-pay

## 2017-06-29 ENCOUNTER — Encounter (HOSPITAL_COMMUNITY)
Admission: RE | Admit: 2017-06-29 | Discharge: 2017-06-29 | Disposition: A | Discharge status: Home / Self Care | Payer: Medicare Other | Source: Ambulatory Visit | Attending: General Surgery | Admitting: General Surgery

## 2017-06-29 ENCOUNTER — Encounter (HOSPITAL_COMMUNITY): Payer: Self-pay

## 2017-06-29 DIAGNOSIS — Z01812 Encounter for preprocedural laboratory examination: Secondary | ICD-10-CM | POA: Insufficient documentation

## 2017-06-29 DIAGNOSIS — M1712 Unilateral primary osteoarthritis, left knee: Secondary | ICD-10-CM | POA: Diagnosis not present

## 2017-06-29 HISTORY — DX: Type 2 diabetes mellitus without complications: E11.9

## 2017-06-29 LAB — BASIC METABOLIC PANEL
Anion gap: 10 (ref 5–15)
BUN: 14 mg/dL (ref 6–20)
CO2: 25 mmol/L (ref 22–32)
Calcium: 9.3 mg/dL (ref 8.9–10.3)
Chloride: 103 mmol/L (ref 101–111)
Creatinine, Ser: 0.81 mg/dL (ref 0.44–1.00)
GFR calc Af Amer: >60 mL/min (ref >60)
GFR calc non Af Amer: >60 mL/min (ref >60)
Glucose, Bld: 110 mg/dL — ABNORMAL HIGH (ref 65–99)
Potassium: 4.6 mmol/L (ref 3.5–5.1)
Sodium: 138 mmol/L (ref 135–145)

## 2017-06-29 LAB — CBC
HEMATOCRIT: 41.1 % (ref 36.0–46.0)
Hemoglobin: 13.9 g/dL (ref 12.0–15.0)
MCH: 30.5 pg (ref 26.0–34.0)
MCHC: 33.8 g/dL (ref 30.0–36.0)
MCV: 90.3 fL (ref 78.0–100.0)
Platelets: 344 10*3/uL (ref 150–400)
RBC: 4.55 MIL/uL (ref 3.87–5.11)
RDW: 13.5 % (ref 11.5–15.5)
WBC: 11.5 10*3/uL — AB (ref 4.0–10.5)

## 2017-06-29 LAB — HEMOGLOBIN A1C
Hgb A1c MFr Bld: 6.2 % — ABNORMAL HIGH (ref 4.8–5.6)
Mean Plasma Glucose: 131.24 mg/dL

## 2017-06-29 LAB — GLUCOSE, CAPILLARY: Glucose-Capillary: 104 mg/dL — ABNORMAL HIGH (ref 65–99)

## 2017-06-29 LAB — PREGNANCY, URINE: Preg Test, Ur: NEGATIVE

## 2017-06-29 NOTE — Progress Notes (Signed)
Pt reported that she was recently diagnosed with Diabetes. Was taking Metformin 500 mg BID. However, endocrinologist indicated to hold Metformin at this time, as Diabetes may resolve after surgery.

## 2017-07-04 ENCOUNTER — Inpatient Hospital Stay (HOSPITAL_COMMUNITY): Payer: Medicare Other

## 2017-07-04 ENCOUNTER — Inpatient Hospital Stay (HOSPITAL_COMMUNITY)
Admission: RE | Admit: 2017-07-04 | Discharge: 2017-07-06 | DRG: 621 | Disposition: A | Discharge status: Home / Self Care | Payer: Medicare Other | Source: Ambulatory Visit | Attending: General Surgery | Admitting: General Surgery

## 2017-07-04 ENCOUNTER — Encounter (HOSPITAL_COMMUNITY)
Admission: RE | Disposition: A | Discharge status: Home / Self Care | Payer: Self-pay | Source: Ambulatory Visit | Attending: General Surgery

## 2017-07-04 ENCOUNTER — Ambulatory Visit: Payer: Self-pay | Admitting: General Surgery

## 2017-07-04 ENCOUNTER — Other Ambulatory Visit: Payer: Self-pay

## 2017-07-04 ENCOUNTER — Encounter (HOSPITAL_COMMUNITY): Payer: Self-pay | Admitting: Emergency Medicine

## 2017-07-04 DIAGNOSIS — F319 Bipolar disorder, unspecified: Secondary | ICD-10-CM | POA: Diagnosis present

## 2017-07-04 DIAGNOSIS — M069 Rheumatoid arthritis, unspecified: Secondary | ICD-10-CM | POA: Diagnosis present

## 2017-07-04 DIAGNOSIS — Z7984 Long term (current) use of oral hypoglycemic drugs: Secondary | ICD-10-CM | POA: Diagnosis not present

## 2017-07-04 DIAGNOSIS — K760 Fatty (change of) liver, not elsewhere classified: Secondary | ICD-10-CM | POA: Diagnosis not present

## 2017-07-04 DIAGNOSIS — E119 Type 2 diabetes mellitus without complications: Secondary | ICD-10-CM | POA: Diagnosis present

## 2017-07-04 DIAGNOSIS — E669 Obesity, unspecified: Secondary | ICD-10-CM | POA: Diagnosis present

## 2017-07-04 DIAGNOSIS — F419 Anxiety disorder, unspecified: Secondary | ICD-10-CM | POA: Diagnosis present

## 2017-07-04 DIAGNOSIS — K219 Gastro-esophageal reflux disease without esophagitis: Secondary | ICD-10-CM | POA: Diagnosis present

## 2017-07-04 DIAGNOSIS — E039 Hypothyroidism, unspecified: Secondary | ICD-10-CM | POA: Diagnosis not present

## 2017-07-04 DIAGNOSIS — Z6841 Body Mass Index (BMI) 40.0 and over, adult: Secondary | ICD-10-CM

## 2017-07-04 DIAGNOSIS — I10 Essential (primary) hypertension: Secondary | ICD-10-CM | POA: Diagnosis present

## 2017-07-04 HISTORY — PX: GASTRIC ROUX-EN-Y: SHX5262

## 2017-07-04 LAB — TYPE AND SCREEN
ABO/RH(D): O POS
Antibody Screen: NEGATIVE

## 2017-07-04 LAB — HEMOGLOBIN AND HEMATOCRIT, BLOOD
HCT: 41.9 % (ref 36.0–46.0)
HEMOGLOBIN: 13.8 g/dL (ref 12.0–15.0)

## 2017-07-04 LAB — GLUCOSE, CAPILLARY
Glucose-Capillary: 84 mg/dL (ref 65–99)
Glucose-Capillary: 186 mg/dL — ABNORMAL HIGH (ref 65–99)
Glucose-Capillary: 160 mg/dL — ABNORMAL HIGH (ref 65–99)
Glucose-Capillary: 179 mg/dL — ABNORMAL HIGH (ref 65–99)

## 2017-07-04 LAB — ABO/RH: ABO/RH(D): O POS

## 2017-07-04 SURGERY — LAPAROSCOPIC ROUX-EN-Y GASTRIC BYPASS WITH UPPER ENDOSCOPY
Anesthesia: General | Site: Abdomen

## 2017-07-04 MED ORDER — ENOXAPARIN SODIUM 30 MG/0.3ML ~~LOC~~ SOLN
30.0000 mg | Freq: Two times a day (BID) | SUBCUTANEOUS | Status: DC
  Administered 2017-07-04 – 2017-07-06 (×4): 30 mg via SUBCUTANEOUS
  Filled 2017-07-04 (×4): qty 0.3

## 2017-07-04 MED ORDER — BUPIVACAINE LIPOSOME 1.3 % IJ SUSP
INTRAMUSCULAR | Status: DC | PRN
  Administered 2017-07-04: 20 mL

## 2017-07-04 MED ORDER — PHENYLEPHRINE 40 MCG/ML (10ML) SYRINGE FOR IV PUSH (FOR BLOOD PRESSURE SUPPORT)
PREFILLED_SYRINGE | INTRAVENOUS | Status: DC | PRN
  Administered 2017-07-04: 80 ug via INTRAVENOUS

## 2017-07-04 MED ORDER — PROPOFOL 10 MG/ML IV BOLUS
INTRAVENOUS | Status: DC | PRN
  Administered 2017-07-04: 140 mg via INTRAVENOUS

## 2017-07-04 MED ORDER — APREPITANT 40 MG PO CAPS
40.0000 mg | ORAL_CAPSULE | ORAL | Status: AC
  Administered 2017-07-04: 40 mg via ORAL
  Filled 2017-07-04: qty 1

## 2017-07-04 MED ORDER — LACTATED RINGERS IV SOLN
INTRAVENOUS | Status: DC | PRN
  Administered 2017-07-04 (×2): via INTRAVENOUS

## 2017-07-04 MED ORDER — PHENYLEPHRINE 40 MCG/ML (10ML) SYRINGE FOR IV PUSH (FOR BLOOD PRESSURE SUPPORT)
PREFILLED_SYRINGE | INTRAVENOUS | Status: AC
  Filled 2017-07-04: qty 10

## 2017-07-04 MED ORDER — ONDANSETRON HCL 4 MG/2ML IJ SOLN
INTRAMUSCULAR | Status: DC | PRN
  Administered 2017-07-04: 4 mg via INTRAVENOUS

## 2017-07-04 MED ORDER — ROCURONIUM BROMIDE 10 MG/ML (PF) SYRINGE
PREFILLED_SYRINGE | INTRAVENOUS | Status: AC
  Filled 2017-07-04: qty 5

## 2017-07-04 MED ORDER — HYDRALAZINE HCL 20 MG/ML IJ SOLN: 10.0000 mg | INTRAMUSCULAR | Status: DC | PRN

## 2017-07-04 MED ORDER — HYDROMORPHONE HCL 1 MG/ML IJ SOLN
0.2500 mg | INTRAMUSCULAR | Status: DC | PRN
  Administered 2017-07-04 (×2): 0.5 mg via INTRAVENOUS

## 2017-07-04 MED ORDER — EPHEDRINE 5 MG/ML INJ
INTRAVENOUS | Status: AC
  Filled 2017-07-04: qty 10

## 2017-07-04 MED ORDER — ONDANSETRON HCL 4 MG/2ML IJ SOLN: 4.0000 mg | INTRAMUSCULAR | Status: DC | PRN

## 2017-07-04 MED ORDER — ACETAMINOPHEN 160 MG/5ML PO SOLN
650.0000 mg | Freq: Four times a day (QID) | ORAL | Status: DC
  Administered 2017-07-04 – 2017-07-06 (×8): 650 mg via ORAL
  Filled 2017-07-04 (×7): qty 20.3

## 2017-07-04 MED ORDER — STERILE WATER FOR IRRIGATION IR SOLN
Status: DC | PRN
  Administered 2017-07-04: 1000 mL

## 2017-07-04 MED ORDER — CHLORHEXIDINE GLUCONATE 4 % EX LIQD: 60.0000 mL | Freq: Once | CUTANEOUS | Status: DC

## 2017-07-04 MED ORDER — GABAPENTIN 300 MG PO CAPS
300.0000 mg | ORAL_CAPSULE | ORAL | Status: AC
  Administered 2017-07-04: 300 mg via ORAL
  Filled 2017-07-04: qty 1

## 2017-07-04 MED ORDER — HEPARIN SODIUM (PORCINE) 5000 UNIT/ML IJ SOLN
5000.0000 [IU] | INTRAMUSCULAR | Status: AC
  Administered 2017-07-04: 5000 [IU] via SUBCUTANEOUS
  Filled 2017-07-04: qty 1

## 2017-07-04 MED ORDER — ONDANSETRON HCL 4 MG/2ML IJ SOLN
INTRAMUSCULAR | Status: AC
  Filled 2017-07-04: qty 2

## 2017-07-04 MED ORDER — FAMOTIDINE IN NACL 20-0.9 MG/50ML-% IV SOLN
20.0000 mg | Freq: Two times a day (BID) | INTRAVENOUS | Status: DC
  Administered 2017-07-04 – 2017-07-05 (×4): 20 mg via INTRAVENOUS
  Filled 2017-07-04 (×4): qty 50

## 2017-07-04 MED ORDER — ROCURONIUM BROMIDE 100 MG/10ML IV SOLN
INTRAVENOUS | Status: DC | PRN
  Administered 2017-07-04 (×3): 20 mg via INTRAVENOUS
  Administered 2017-07-04: 50 mg via INTRAVENOUS
  Administered 2017-07-04: 10 mg via INTRAVENOUS

## 2017-07-04 MED ORDER — LIDOCAINE 2% (20 MG/ML) 5 ML SYRINGE
INTRAMUSCULAR | Status: AC
  Filled 2017-07-04: qty 5

## 2017-07-04 MED ORDER — FENTANYL CITRATE (PF) 100 MCG/2ML IJ SOLN
INTRAMUSCULAR | Status: AC
  Filled 2017-07-04: qty 2

## 2017-07-04 MED ORDER — KETAMINE HCL 10 MG/ML IJ SOLN
INTRAMUSCULAR | Status: AC
  Filled 2017-07-04: qty 1

## 2017-07-04 MED ORDER — BUPIVACAINE HCL (PF) 0.25 % IJ SOLN
INTRAMUSCULAR | Status: AC
  Filled 2017-07-04: qty 30

## 2017-07-04 MED ORDER — MIDAZOLAM HCL 5 MG/5ML IJ SOLN
INTRAMUSCULAR | Status: DC | PRN
  Administered 2017-07-04: 2 mg via INTRAVENOUS

## 2017-07-04 MED ORDER — SODIUM CHLORIDE 0.9 % IV SOLN
2.0000 g | INTRAVENOUS | Status: AC
  Administered 2017-07-04: 2 g via INTRAVENOUS
  Filled 2017-07-04: qty 2

## 2017-07-04 MED ORDER — MEPERIDINE HCL 50 MG/ML IJ SOLN: 6.2500 mg | INTRAMUSCULAR | Status: DC | PRN

## 2017-07-04 MED ORDER — FENTANYL CITRATE (PF) 100 MCG/2ML IJ SOLN
INTRAMUSCULAR | Status: DC | PRN
  Administered 2017-07-04: 50 ug via INTRAVENOUS
  Administered 2017-07-04: 100 ug via INTRAVENOUS
  Administered 2017-07-04: 50 ug via INTRAVENOUS

## 2017-07-04 MED ORDER — ONDANSETRON HCL 4 MG/2ML IJ SOLN: 4.0000 mg | Freq: Once | INTRAMUSCULAR | Status: DC | PRN

## 2017-07-04 MED ORDER — BUPIVACAINE LIPOSOME 1.3 % IJ SUSP
20.0000 mL | Freq: Once | INTRAMUSCULAR | Status: DC
  Filled 2017-07-04: qty 20

## 2017-07-04 MED ORDER — LIDOCAINE HCL (CARDIAC) PF 100 MG/5ML IV SOSY
PREFILLED_SYRINGE | INTRAVENOUS | Status: DC | PRN
  Administered 2017-07-04: 100 mg via INTRAVENOUS

## 2017-07-04 MED ORDER — LEVOTHYROXINE SODIUM 88 MCG PO TABS
214.0000 ug | ORAL_TABLET | Freq: Every day | ORAL | Status: DC
  Administered 2017-07-05 – 2017-07-06 (×2): 213.5 ug via ORAL
  Filled 2017-07-04 (×2): qty 1

## 2017-07-04 MED ORDER — ACETAMINOPHEN 500 MG PO TABS
1000.0000 mg | ORAL_TABLET | ORAL | Status: AC
  Administered 2017-07-04: 1000 mg via ORAL
  Filled 2017-07-04: qty 2

## 2017-07-04 MED ORDER — GABAPENTIN 100 MG PO CAPS
200.0000 mg | ORAL_CAPSULE | Freq: Two times a day (BID) | ORAL | Status: DC
  Administered 2017-07-04 – 2017-07-05 (×3): 200 mg via ORAL
  Filled 2017-07-04 (×3): qty 2

## 2017-07-04 MED ORDER — LIDOCAINE 2% (20 MG/ML) 5 ML SYRINGE
INTRAMUSCULAR | Status: DC | PRN
  Administered 2017-07-04: 1.5 mg/kg/h via INTRAVENOUS

## 2017-07-04 MED ORDER — CLONAZEPAM 1 MG PO TABS: 1.0000 mg | ORAL_TABLET | Freq: Three times a day (TID) | ORAL | Status: DC | PRN

## 2017-07-04 MED ORDER — METHOCARBAMOL 500 MG PO TABS
750.0000 mg | ORAL_TABLET | Freq: Three times a day (TID) | ORAL | Status: DC | PRN
  Administered 2017-07-05: 750 mg via ORAL
  Filled 2017-07-04: qty 2

## 2017-07-04 MED ORDER — SUGAMMADEX SODIUM 500 MG/5ML IV SOLN
INTRAVENOUS | Status: AC
  Filled 2017-07-04: qty 5

## 2017-07-04 MED ORDER — PREMIER PROTEIN SHAKE
2.0000 [oz_av] | ORAL | Status: DC
  Administered 2017-07-05 (×7): 2 [oz_av] via ORAL

## 2017-07-04 MED ORDER — BUPIVACAINE HCL 0.25 % IJ SOLN
INTRAMUSCULAR | Status: DC | PRN
  Administered 2017-07-04: 30 mL

## 2017-07-04 MED ORDER — LIDOCAINE HCL 2 % IJ SOLN
INTRAMUSCULAR | Status: AC
  Filled 2017-07-04: qty 20

## 2017-07-04 MED ORDER — DEXAMETHASONE SODIUM PHOSPHATE 4 MG/ML IJ SOLN
4.0000 mg | INTRAMUSCULAR | Status: AC
  Administered 2017-07-04: 5 mg via INTRAVENOUS

## 2017-07-04 MED ORDER — MIDAZOLAM HCL 2 MG/2ML IJ SOLN
INTRAMUSCULAR | Status: AC
  Filled 2017-07-04: qty 2

## 2017-07-04 MED ORDER — HYDROMORPHONE HCL 1 MG/ML IJ SOLN
INTRAMUSCULAR | Status: AC
  Administered 2017-07-04: 0.5 mg via INTRAVENOUS
  Filled 2017-07-04: qty 1

## 2017-07-04 MED ORDER — MORPHINE SULFATE (PF) 2 MG/ML IV SOLN
1.0000 mg | INTRAVENOUS | Status: DC | PRN
  Administered 2017-07-05: 2 mg via INTRAVENOUS
  Filled 2017-07-04: qty 1

## 2017-07-04 MED ORDER — DEXAMETHASONE SODIUM PHOSPHATE 10 MG/ML IJ SOLN
INTRAMUSCULAR | Status: AC
  Filled 2017-07-04: qty 1

## 2017-07-04 MED ORDER — SODIUM CHLORIDE 0.9 % IV SOLN
INTRAVENOUS | Status: DC
  Administered 2017-07-04 – 2017-07-06 (×5): via INTRAVENOUS

## 2017-07-04 MED ORDER — SUGAMMADEX SODIUM 200 MG/2ML IV SOLN
INTRAVENOUS | Status: DC | PRN
  Administered 2017-07-04: 300 mg via INTRAVENOUS

## 2017-07-04 MED ORDER — DOXEPIN HCL 25 MG PO CAPS
25.0000 mg | ORAL_CAPSULE | Freq: Every day | ORAL | Status: DC
  Administered 2017-07-04 – 2017-07-05 (×2): 25 mg via ORAL
  Filled 2017-07-04 (×2): qty 1

## 2017-07-04 MED ORDER — LACTATED RINGERS IR SOLN
Status: DC | PRN
  Administered 2017-07-04: 1000 mL

## 2017-07-04 MED ORDER — 0.9 % SODIUM CHLORIDE (POUR BTL) OPTIME
TOPICAL | Status: DC | PRN
  Administered 2017-07-04: 1000 mL

## 2017-07-04 MED ORDER — PROPOFOL 10 MG/ML IV BOLUS
INTRAVENOUS | Status: AC
  Filled 2017-07-04: qty 40

## 2017-07-04 MED ORDER — SIMETHICONE 80 MG PO CHEW: 80.0000 mg | CHEWABLE_TABLET | Freq: Four times a day (QID) | ORAL | Status: DC | PRN

## 2017-07-04 MED ORDER — OXYCODONE HCL 5 MG/5ML PO SOLN
5.0000 mg | ORAL | Status: DC | PRN
  Administered 2017-07-05: 5 mg via ORAL
  Filled 2017-07-04: qty 5

## 2017-07-04 MED ORDER — SCOPOLAMINE 1 MG/3DAYS TD PT72
1.0000 | MEDICATED_PATCH | TRANSDERMAL | Status: DC
  Administered 2017-07-04: 1.5 mg via TRANSDERMAL
  Filled 2017-07-04: qty 1

## 2017-07-04 MED ORDER — INSULIN ASPART 100 UNIT/ML ~~LOC~~ SOLN
0.0000 [IU] | Freq: Three times a day (TID) | SUBCUTANEOUS | Status: DC
Start: 2017-07-04 — End: 2017-07-06
  Administered 2017-07-04 (×2): 3 [IU] via SUBCUTANEOUS
  Administered 2017-07-05: 2 [IU] via SUBCUTANEOUS

## 2017-07-04 SURGICAL SUPPLY — 67 items
APL SKNCLS STERI-STRIP NONHPOA (GAUZE/BANDAGES/DRESSINGS) ×1
APPLIER CLIP 5 13 M/L LIGAMAX5 (MISCELLANEOUS)
APPLIER CLIP ROT 10 11.4 M/L (STAPLE)
APPLIER CLIP ROT 13.4 12 LRG (CLIP)
APR CLP LRG 13.4X12 ROT 20 MLT (CLIP)
APR CLP MED LRG 11.4X10 (STAPLE)
APR CLP MED LRG 5 ANG JAW (MISCELLANEOUS)
BANDAGE ADH SHEER 1  50/CT (GAUZE/BANDAGES/DRESSINGS) ×2 IMPLANT
BENZOIN TINCTURE PRP APPL 2/3 (GAUZE/BANDAGES/DRESSINGS) ×1 IMPLANT
BLADE SURG SZ11 CARB STEEL (BLADE) ×2 IMPLANT
CABLE HIGH FREQUENCY MONO STRZ (ELECTRODE) ×2 IMPLANT
CHLORAPREP W/TINT 26ML (MISCELLANEOUS) ×3 IMPLANT
CLIP APPLIE 5 13 M/L LIGAMAX5 (MISCELLANEOUS) IMPLANT
CLIP APPLIE ROT 10 11.4 M/L (STAPLE) IMPLANT
CLIP APPLIE ROT 13.4 12 LRG (CLIP) IMPLANT
COVER SURGICAL LIGHT HANDLE (MISCELLANEOUS) ×2 IMPLANT
DEVICE SUTURE ENDOST 10MM (ENDOMECHANICALS) ×2 IMPLANT
DRAIN PENROSE LF 8X20.3CM SIL (WOUND CARE) ×1 IMPLANT
ELECT L-HOOK LAP 45CM DISP (ELECTROSURGICAL) ×2
ELECT PENCIL ROCKER SW 15FT (MISCELLANEOUS) ×2 IMPLANT
ELECTRODE L-HOOK LAP 45CM DISP (ELECTROSURGICAL) ×1 IMPLANT
GAUZE 4X4 16PLY RFD (DISPOSABLE) ×2 IMPLANT
GAUZE SPONGE 4X4 12PLY STRL (GAUZE/BANDAGES/DRESSINGS) IMPLANT
GLOVE BIOGEL PI IND STRL 7.0 (GLOVE) ×1 IMPLANT
GLOVE BIOGEL PI INDICATOR 7.0 (GLOVE) ×1
GLOVE SURG SS PI 7.0 STRL IVOR (GLOVE) ×5 IMPLANT
GOWN STRL REUS W/TWL LRG LVL3 (GOWN DISPOSABLE) ×2 IMPLANT
GOWN STRL REUS W/TWL XL LVL3 (GOWN DISPOSABLE) ×6 IMPLANT
GRASPER SUT TROCAR 14GX15 (MISCELLANEOUS) ×1 IMPLANT
HANDLE STAPLE EGIA 4 XL (STAPLE) ×2 IMPLANT
HOVERMATT SINGLE USE (MISCELLANEOUS) ×2 IMPLANT
KIT BASIN OR (CUSTOM PROCEDURE TRAY) ×2 IMPLANT
KIT GASTRIC LAVAGE 34FR ADT (SET/KITS/TRAYS/PACK) IMPLANT
MARKER SKIN DUAL TIP RULER LAB (MISCELLANEOUS) ×2 IMPLANT
NEEDLE SPNL 22GX3.5 QUINCKE BK (NEEDLE) ×2 IMPLANT
PACK CARDIOVASCULAR III (CUSTOM PROCEDURE TRAY) ×2 IMPLANT
RELOAD EGIA 45 MED/THCK PURPLE (STAPLE) ×2 IMPLANT
RELOAD EGIA 45 TAN VASC (STAPLE) IMPLANT
RELOAD EGIA 60 MED/THCK PURPLE (STAPLE) ×8 IMPLANT
RELOAD EGIA 60 TAN VASC (STAPLE) ×6 IMPLANT
RELOAD ENDO STITCH 2.0 (ENDOMECHANICALS) ×22
RELOAD STAPLE 60 MED/THCK ART (STAPLE) ×4 IMPLANT
RELOAD SUT SNGL STCH ABSRB 2-0 (ENDOMECHANICALS) ×5 IMPLANT
RELOAD SUT SNGL STCH BLK 2-0 (ENDOMECHANICALS) ×5 IMPLANT
SCISSORS LAP 5X45 EPIX DISP (ENDOMECHANICALS) ×2 IMPLANT
SHEARS HARMONIC ACE PLUS 45CM (MISCELLANEOUS) ×2 IMPLANT
SLEEVE XCEL OPT CAN 5 100 (ENDOMECHANICALS) ×6 IMPLANT
SOLUTION ANTI FOG 6CC (MISCELLANEOUS) ×2 IMPLANT
STAPLER VISISTAT 35W (STAPLE) IMPLANT
STRIP CLOSURE SKIN 1/2X4 (GAUZE/BANDAGES/DRESSINGS) ×1 IMPLANT
SUT ETHILON 2 0 PS N (SUTURE) IMPLANT
SUT MNCRL AB 4-0 PS2 18 (SUTURE) ×2 IMPLANT
SUT RELOAD ENDO STITCH 2 48X1 (ENDOMECHANICALS) ×5
SUT RELOAD ENDO STITCH 2.0 (ENDOMECHANICALS) ×6
SUT SILK 0 SH 30 (SUTURE) IMPLANT
SUT VICRYL 0 TIES 12 18 (SUTURE) IMPLANT
SUTURE RELOAD END STTCH 2 48X1 (ENDOMECHANICALS) ×5 IMPLANT
SUTURE RELOAD ENDO STITCH 2.0 (ENDOMECHANICALS) ×6 IMPLANT
SYR 20CC LL (SYRINGE) ×2 IMPLANT
SYR 50ML LL SCALE MARK (SYRINGE) ×2 IMPLANT
TOWEL OR 17X26 10 PK STRL BLUE (TOWEL DISPOSABLE) ×2 IMPLANT
TOWEL OR NON WOVEN STRL DISP B (DISPOSABLE) ×2 IMPLANT
TROCAR BLADELESS OPT 5 100 (ENDOMECHANICALS) ×2 IMPLANT
TROCAR XCEL 12X100 BLDLESS (ENDOMECHANICALS) ×2 IMPLANT
TUBING CONNECTING 10 (TUBING) ×3 IMPLANT
TUBING ENDO SMARTCAP PENTAX (MISCELLANEOUS) ×2 IMPLANT
TUBING INSUF HEATED (TUBING) ×2 IMPLANT

## 2017-07-04 NOTE — Plan of Care (Signed)
Plan of care discussed with patient and family 

## 2017-07-04 NOTE — Transfer of Care (Signed)
Immediate Anesthesia Transfer of Care Note  Patient: Allison Atkinson  Procedure(s) Performed: LAPAROSCOPIC ROUX-EN-Y GASTRIC BYPASS WITH UPPER ENDOSCOPY (N/A Abdomen)  Patient Location: PACU  Anesthesia Type:General  Level of Consciousness: awake, alert  and oriented  Airway & Oxygen Therapy: Patient Spontanous Breathing and Patient connected to face mask oxygen  Post-op Assessment: Report given to RN and Post -op Vital signs reviewed and stable  Post vital signs: Reviewed and stable  Last Vitals:  Vitals Value Taken Time  BP    Temp    Pulse    Resp    SpO2      Last Pain:  Vitals:   07/04/17 0611  TempSrc: Oral  PainSc:       Patients Stated Pain Goal: 4 (07/04/17 0551)  Complications: No apparent anesthesia complications

## 2017-07-04 NOTE — Anesthesia Postprocedure Evaluation (Signed)
Anesthesia Post Note  Patient: Allison Atkinson  Procedure(s) Performed: LAPAROSCOPIC ROUX-EN-Y GASTRIC BYPASS WITH UPPER ENDOSCOPY (N/A Abdomen)     Patient location during evaluation: PACU Anesthesia Type: General Level of consciousness: awake and alert Pain management: pain level controlled Vital Signs Assessment: post-procedure vital signs reviewed and stable Respiratory status: spontaneous breathing, nonlabored ventilation, respiratory function stable and patient connected to nasal cannula oxygen Cardiovascular status: blood pressure returned to baseline and stable Postop Assessment: no apparent nausea or vomiting Anesthetic complications: no    Last Vitals:  Vitals:   07/04/17 1310 07/04/17 1357  BP: 140/89 (!) 142/83  Pulse: 78 77  Resp: 18 17  Temp: 36.6 C 36.6 C  SpO2: 98% 94%    Last Pain:  Vitals:   07/04/17 1357  TempSrc: Oral  PainSc:                  Allison Atkinson

## 2017-07-04 NOTE — Op Note (Signed)
Allison Atkinson [id number] Oct 01, 1975 07/04/2017  Preoperative diagnosis: morbid obesity  Postoperative diagnosis: Same   Procedure: Upper endoscopy   Surgeon: Susy Frizzle B. Daphine Deutscher  M.D., FACS   Anesthesia: Gen.   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    Description of procedure: The endoscopy was placed in the mouth and into the oropharynx and under endoscopic vision it was advanced to the esophagogastric junction.  The pouch was insufflated and it distended nicely.  The EG junction was at 39 and the pouch appeared to be about 5 cm in length.  A patent anastomosis was seen.   No bleeding or leaks were detected.  The scope was withdrawn without difficulty.     Matt B. Daphine Deutscher, MD, FACS General, Bariatric, & Minimally Invasive Surgery [facility], [place]

## 2017-07-04 NOTE — Progress Notes (Signed)
Discussed post op day goals with patient including ambulation, IS, diet progression, pain, and nausea control.  Questions answered. 

## 2017-07-04 NOTE — Discharge Instructions (Signed)
° ° ° °GASTRIC BYPASS/SLEEVE ° Home Care Instructions ° ° These instructions are to help you care for yourself when you go home. ° °Call: If you have any problems. °• Call [phone] and ask for the surgeon on call °• If you need immediate help, come to the ER at [facility].  °• Tell the ER staff that you are a new post-op gastric bypass or gastric sleeve patient °  °Signs and symptoms to report: • Severe vomiting or nausea °o If you cannot keep down clear liquids for longer than 1 day, call your surgeon  °• Abdominal pain that does not get better after taking your pain medication °• Fever over 100.4° F with chills °• Heart beating over 100 beats a minute °• Shortness of breath at rest °• Chest pain °•  Redness, swelling, drainage, or foul odor at incision (surgical) sites °•  If your incisions open or pull apart °• Swelling or pain in calf (lower leg) °• Diarrhea (Loose bowel movements that happen often), frequent watery, uncontrolled bowel movements °• Constipation, (no bowel movements for 3 days) if this happens: Pick one °o Milk of Magnesia, 2 tablespoons by mouth, 3 times a day for 2 days if needed °o Stop taking Milk of Magnesia once you have a bowel movement °o Call your doctor if constipation continues °Or °o Miralax  (instead of Milk of Magnesia) following the label instructions °o Stop taking Miralax once you have a bowel movement °o Call your doctor if constipation continues °• Anything you think is not normal °  °Normal side effects after surgery: • Unable to sleep at night or unable to focus °• Irritability or moody °• Being tearful (crying) or depressed °These are common complaints, possibly related to your anesthesia medications that put you to sleep, stress of surgery, and change in lifestyle.  This usually goes away a few weeks after surgery.  If these feelings continue, call your primary care doctor. °  °Wound Care: You may have surgical glue, steri-strips, or staples over your incisions after  surgery °• Surgical glue:  Looks like a clear film over your incisions and will wear off a little at a time °• Steri-strips: Strips of tape over your incisions. You may notice a yellowish color on the skin under the steri-strips. This is used to make the   steri-strips stick better. Do not pull the steri-strips off - let them fall off °• Staples: Staples may be removed before you leave the hospital °o If you go home with staples, call [facility], [phone] at for an appointment with your surgeon’s nurse to have staples removed 10 days after surgery. °• Showering: You may shower two (2) days after your surgery unless your surgeon tells you differently °o Wash gently around incisions with warm soapy water, rinse well, and gently pat dry  °o No tub baths until staples are removed, steri-strips fall off or glue is gone.  °  °Medications: • Medications should be liquid or crushed if larger than the size of a dime °• Extended release pills (medication that release a little bit at a time through the day) should NOT be crushed or cut. (examples include XL, ER, DR, SR) °• Depending on the size and number of medications you take, you may need to space (take a few throughout the day)/change the time you take your medications so that you do not over-fill your pouch (smaller stomach) °• Make sure you follow-up with your primary care doctor to   make medication changes needed during rapid weight loss and life-style changes °• If you have diabetes, follow up with the doctor that orders your diabetes medication(s) within one week after surgery and check your blood sugar regularly. °• Do not drive while taking prescription pain medication  °• It is ok to take Tylenol by the bottle instructions with your pain medicine or instead of your pain medicine as needed.  DO NOT TAKE NSAIDS (EXAMPLES OF NSAIDS:  IBUPROFREN/ NAPROXEN)  °Diet:                    First 2 Weeks ° You will see the dietician t about two (2) weeks  after your surgery. The dietician will increase the types of foods you can eat if you are handling liquids well: °• If you have severe vomiting or nausea and cannot keep down clear liquids lasting longer than 1 day, call your surgeon @ ([phone]) °Protein Shake °• Drink at least 2 ounces of shake 5-6 times per day °• Each serving of protein shakes (usually 8 - 12 ounces) should have: °o 15 grams of protein  °o And no more than 5 grams of carbohydrate  °• Goal for protein each day: °o Men = 80 grams per day °o Women = 60 grams per day °• Protein powder may be added to fluids such as non-fat milk or Lactaid milk or unsweetened Soy/Almond milk (limit to 35 grams added protein powder per serving) ° °Hydration °• Slowly increase the amount of water and other clear liquids as tolerated (See Acceptable Fluids) °• Slowly increase the amount of protein shake as tolerated  °•  Sip fluids slowly and throughout the day.  Do not use straws. °• May use sugar substitutes in small amounts (no more than 6 - 8 packets per day; i.e. Splenda) ° °Fluid Goal °• The first goal is to drink at least 8 ounces of protein shake/drink per day (or as directed by the nutritionist); some examples of protein shakes are Syntrax Nectar, Adkins Advantage, EAS Edge HP, and Unjury. See handout from pre-op Bariatric Education Class: °o Slowly increase the amount of protein shake you drink as tolerated °o You may find it easier to slowly sip shakes throughout the day °o It is important to get your proteins in first °• Your fluid goal is to drink 64 - 100 ounces of fluid daily °o It may take a few weeks to build up to this °• 32 oz (or more) should be clear liquids  °And  °• 32 oz (or more) should be full liquids (see below for examples) °• Liquids should not contain sugar, caffeine, or carbonation ° °Clear Liquids: °• Water or Sugar-free flavored water (i.e. Fruit H2O, Propel) °• Decaffeinated coffee or tea (sugar-free) °• Crystal Lite, Wyler’s Lite,  Minute Maid Lite °• Sugar-free Jell-O °• Bouillon or broth °• Sugar-free Popsicle:   *Less than 20 calories each; Limit 1 per day ° °Full Liquids: °Protein Shakes/Drinks + 2 choices per day of other full liquids °• Full liquids must be: °o No More Than 15 grams of Carbs per serving  °o No More Than 3 grams of Fat per serving °• Strained low-fat cream soup (except Cream of Potato or Tomato) °• Non-Fat milk °• Fat-free Lactaid Milk °• Unsweetened Soy Or Unsweetened Almond Milk °• Low Sugar yogurt (Dannon Lite & Fit, Greek yogurt; Oikos Triple Zero; Chobani Simply 100; Yoplait 100 calorie Greek - No Fruit on the Bottom) ° °  °Vitamins   and Minerals • Start 1 day after surgery unless otherwise directed by your surgeon °• 2 Chewable Bariatric Specific Multivitamin / Multimineral Supplement with iron (Example: Bariatric Advantage Multi EA) °• Chewable Calcium with Vitamin D-3 °(Example: 3 Chewable Calcium Plus 600 with Vitamin D-3) °o Take 500 mg three (3) times a day for a total of 1500 mg each day °o Do not take all 3 doses of calcium at one time as it may cause constipation, and you can only absorb 500 mg  at a time  °o Do not mix multivitamins containing iron with calcium supplements; take 2 hours apart °• Menstruating women and those with a history of anemia (a blood disease that causes weakness) may need extra iron °o Talk with your doctor to see if you need more iron °• Do not stop taking or change any vitamins or minerals until you talk to your dietitian or surgeon °• Your Dietitian and/or surgeon must approve all vitamin and mineral supplements °  °Activity and Exercise: Limit your physical activity as instructed by your doctor.  It is important to continue walking at home.  During this time, use these guidelines: °• Do not lift anything greater than ten (10) pounds for at least two (2) weeks °• Do not go back to work or drive until your surgeon says you can °• You may have sex when you feel comfortable  °o It is  VERY important for female patients to use a reliable birth control method; fertility often increases after surgery  °o All hormonal birth control will be ineffective for 30 days after surgery due to medications given during surgery a barrier method must be used. °o Do not get pregnant for at least 18 months °• Start exercising as soon as your doctor tells you that you can °o Make sure your doctor approves any physical activity °• Start with a simple walking program °• Walk 5-15 minutes each day, 7 days per week.  °• Slowly increase until you are walking 30-45 minutes per day °Consider joining our BELT program. [phone] or email [email or web address] °  °Special Instructions Things to remember: °• Use your CPAP when sleeping if this applies to you ° °• [facility] has two free Bariatric Surgery Support Groups that meet monthly °o The 3rd Thursday of each month, 6 pm, [facility]  °o The 2nd Friday of each month, 11:45 am in the private dining room in the basement of [place] °• It is very important to keep all follow up appointments with your surgeon, dietitian, primary care physician, and behavioral health practitioner °• Routine follow up schedule with your surgeon include appointments at 2-3 weeks, 6-8 weeks, 6 months, and 1 year at a minimum.  Your surgeon may request to see you more often.   °o After the first year, please follow up with your bariatric surgeon and dietitian at least once a year in order to maintain best weight loss results °[facility]: [phone] °[facility]: [phone] °Bariatric Nurse Coordinator: [phone] °  °   Reviewed and Endorsed  °by [facility] Patient Education Committee, June, 2016 °Edits Approved: Aug, 2018 ° ° ° °

## 2017-07-04 NOTE — Anesthesia Procedure Notes (Signed)
Procedure Name: Intubation Date/Time: 07/04/2017 7:30 AM Performed by: Maxwell Caul, CRNA Pre-anesthesia Checklist: Patient identified, Emergency Drugs available, Suction available, Patient being monitored and Timeout performed Patient Re-evaluated:Patient Re-evaluated prior to induction Oxygen Delivery Method: Circle system utilized Preoxygenation: Pre-oxygenation with 100% oxygen Induction Type: IV induction Ventilation: Mask ventilation without difficulty Laryngoscope Size: Mac and 3 Grade View: Grade I Tube type: Oral Tube size: 7.0 mm Number of attempts: 1 Airway Equipment and Method: Stylet Placement Confirmation: ETT inserted through vocal cords under direct vision,  positive ETCO2 and breath sounds checked- equal and bilateral Secured at: 22 cm Tube secured with: Tape Dental Injury: Teeth and Oropharynx as per pre-operative assessment

## 2017-07-04 NOTE — Progress Notes (Signed)
Pt denies diabetes, reports she is on metformin for PCOS, no CBG done.

## 2017-07-04 NOTE — Op Note (Signed)
Preop Diagnosis: Obesity Class III  Postop Diagnosis: same  Procedure performed: laparoscopic Roux en Y gastric bypass  Assitant: Wenda Low  Indications:  The patient is a 42 y.o. year-old morbidly obese female who has been followed in the Bariatric Clinic as an outpatient. This patient was diagnosed with morbid obesity with a BMI of Body mass index is 51.52 kg/m. and significant co-morbidities including hypertension and non-insulin dependent diabetes.  The patient was counseled extensively in the Bariatric Outpatient Clinic and after a thorough explanation of the risks and benefits of surgery (including death from complications, bowel leak, infection such as peritonitis and/or sepsis, internal hernia, bleeding, need for blood transfusion, bowel obstruction, organ failure, pulmonary embolus, deep venous thrombosis, wound infection, incisional hernia, skin breakdown, and others entailed on the consent form) and after a compliant diet and exercise program, the patient was scheduled for an elective laparoscopic gastric bypass.  Description of Operation:  Following informed consent, the patient was taken to the operating room and placed on the operating table in the supine position.  She had previously received prophylactic antibiotics and subcutaneous heparin for DVT prophylaxis in the pre-op holding area.  After induction of general endotracheal anesthesia by the anesthesiologist, the patient underwent placement of sequential compression devices, Foley catheter and an oro-gastric tube.  A timeout was confirmed by the surgery and anesthesia teams.  The patient was adequately padded at all pressure points and placed on a footboard to prevent slippage from the OR table during extremes of position during surgery.  She underwent a routine sterile prep and drape of her entire abdomen.    Next, A transverse incision was made under the left subcostal area and a 80mm optical viewing trocar was introduced into  the peritoneal cavity. Pneumoperitoneum was applied with a high flow and low pressure. A laparoscope was inserted to confirm placement. A extraperitoneal block was then placed at the lateral abdominal wall using exparel diluted with marcaine . 5 additional trocars were placed: 1 82mm trocar to the left of the midline. 1 additional 37mm trocar in the left lateral area, 1 46mm trocar in the right mid abdomen, and 1 41mm trocar in the right subcostal area.  The greater omentum was flipped over the transverse colon and under the left lobe of the liver. The ligament of trietz was identified. 40cm of jejunum was measured starting from the ligament of Trietz. The mesentery was checked to ensure mobility. Next, a 37mm 2-98mm tristapler was used to divide the jejunum at this location. The harmonic scalpel was used to divide the mesentery down to the origin. A 1/2" penrose was sutured to the distal side. 100cm of jejunum was measured starting at the division. 2-0 silk was used to appose the biliary limb to the 100cm mark of jejunum in 2 places. Enterotomies were made in the biliary and common channels and a 65mm 2-3 tristapler was used to create the J-J anastomosis. A 2-0 silk was used to appose the enterotomy edges and a 2-3 tristapler was used to close the enterotomy. An anti-obstruction 2-0 silk suture was placed. Next, the mesenteric defect was closed with a 2-0 silk in running fashion.The J-J appeared patent and in neutral position.  Next, the omentum was divided using the Harmonic scalpel. The patient was placed in steep Reverse Trendelenberg position. A Nathanson retracted was placed through a subxiphoid incision and used to retract the liver. The fat pad over the fundus was incised to free the fundus. Next, a position along the  lesser curve 6cm from GE junction was identified. The pars flaccida was entered and the fat over the lesser curve divided to enter the lesser sac. 4 41mm 3-64mm tristaple firings were  peformed to create a 6cm pouch. The Roux limb was identified using the placed penrose and brought up to the stomach in antecolic fashion. The limb was inspected to ensure a neutral position. A 2-0 vicryl suture was then used to create a posterior layer connecting the stomach to the Roux limb jejunum in running fashion. Next cautery was used to create an enterotomy along the medial aspect of this suture line and Harmonic scalpel used to create gastotomy. A 32mm 3-65mm tristapler was then used to create a 25-30mm anastomosis. 2 2-0 vicryl sutures were used in running fashion to close the gastrotomy. Finally, a 2-0 vicryl suture was used to close an anterior layer of stomach and jejunum over the anastomosis in running fashion. The penrose was removed from the Roux limb. A 2-0 vicryl was used to appose the transverse mesocolon to the mesentery of the Roux limb.  The assistant then went and performed an upper endoscopy and leak test. No bubbles were seen and the pouch and limb distended appropriately. The limb and pouch were deflated, the endoscope was removed. Hemostasis was ensured. Pneumoperitoneum was evacuated, all ports were removed and all incisions closed with 4-0 monocryl suture in subcuticular fashion. Steristrips and bandaids were put in place for dressing. The patient awoke from anesthesia and was brought to pacu in stable condition. All counts were correct.  Specimens:  None  Estimated Blood Loss: 30 ml  Local Anesthesia: 50 ml Exparel: 0.5% Marcaine Mix  Post-Op Plan:       Pain Management: PO, prn      Antibiotics: Prophylactic      Anticoagulation: Prophylactic, Starting now      Post Op Studies/Consults: Not applicable      Intended Discharge: within 48h      Intended Outpatient Follow-Up: Two Week      Intended Outpatient Studies: Not Applicable      Other: Not Applicable   De Blanch Jerimie Mancuso

## 2017-07-04 NOTE — Anesthesia Preprocedure Evaluation (Signed)
Anesthesia Evaluation  Patient identified by MRN, date of birth, ID band Patient awake    Reviewed: Allergy & Precautions, NPO status , Patient's Chart, lab work & pertinent test results  Airway Mallampati: II  TM Distance: >3 FB Neck ROM: Full    Dental   Pulmonary    Pulmonary exam normal        Cardiovascular hypertension, Pt. on medications Normal cardiovascular exam     Neuro/Psych Anxiety Bipolar Disorder    GI/Hepatic GERD  Medicated and Controlled,  Endo/Other  diabetes, Type 2  Renal/GU      Musculoskeletal  (+) Arthritis , Rheumatoid disorders,    Abdominal   Peds  Hematology   Anesthesia Other Findings   Reproductive/Obstetrics                             Anesthesia Physical Anesthesia Plan  ASA: III  Anesthesia Plan: General   Post-op Pain Management:    Induction: Intravenous  PONV Risk Score and Plan: 3 and Ondansetron, Dexamethasone and Midazolam  Airway Management Planned: Oral ETT  Additional Equipment:   Intra-op Plan:   Post-operative Plan: Extubation in OR  Informed Consent: I have reviewed the patients History and Physical, chart, labs and discussed the procedure including the risks, benefits and alternatives for the proposed anesthesia with the patient or authorized representative who has indicated his/her understanding and acceptance.     Plan Discussed with: CRNA and Surgeon  Anesthesia Plan Comments:         Anesthesia Quick Evaluation

## 2017-07-04 NOTE — H&P (Signed)
Allison Atkinson is an 42 y.o. female.   Chief Complaint: obesity HPI: 42 yo female with long history obesity and diabetes. She has completed all requirements and is ready to proceed with bariatric surgery.  Past Medical History:  Diagnosis Date  . Anxiety   . Bipolar disorder (HCC)   . Diabetes mellitus without complication (HCC) 05/2017  . Fatty liver disease, nonalcoholic   . History of cholecystectomy   . Hypertension   . Polycystic ovarian disease   . RA (rheumatoid arthritis) (HCC)   . Thyroid disease    hypothyroid    Past Surgical History:  Procedure Laterality Date  . CHOLECYSTECTOMY  1999  . COLONOSCOPY W/ BIOPSIES  2010  . ESOPHAGOGASTRODUODENOSCOPY  2010  . KNEE ARTHROSCOPY Right     Family History  Problem Relation Age of Onset  . Hypertension Mother   . Alcohol abuse Father    Social History:  reports that she has never smoked. She has never used smokeless tobacco. She reports that she drinks alcohol. She reports that she does not use drugs.  Allergies:  Allergies  Allergen Reactions  . Risperdal [Risperidone] Anaphylaxis  . Latex Rash    Raised red rash at areas of contact    Medications Prior to Admission  Medication Sig Dispense Refill  . Armodafinil 150 MG tablet Take 150 mg by mouth every morning.   0  . cholecalciferol (VITAMIN D) 1000 units tablet Take 1,000 Units by mouth daily.     . clonazePAM (KLONOPIN) 1 MG tablet Take 1 tablet (1 mg total) by mouth 3 (three) times daily as needed for anxiety.    . COSENTYX SENSOREADY 300 DOSE 150 MG/ML SOAJ INJECT 300 MG UNDER THE SKIN (SUBCUTANEOUS INJECTION) EVERY 30 (THIRTY) DAYS. 6 pen 0  . doxepin (SINEQUAN) 25 MG capsule Take 25 mg by mouth at bedtime.   0  . DULoxetine (CYMBALTA) 60 MG capsule Take 60 mg by mouth daily.   0  . esomeprazole (NEXIUM) 40 MG capsule Take 40 mg by mouth daily.     . metFORMIN (GLUCOPHAGE) 500 MG tablet Take 500 mg by mouth 2 (two) times daily with a meal.    .  methocarbamol (ROBAXIN) 500 MG tablet TAKE (1) TABLET BY MOUTH THREE TIMES DAILY AS NEEDED. 90 tablet 0  . SYNTHROID 112 MCG tablet Take 214 mcg by mouth daily before breakfast.   11  . VRAYLAR 4.5 MG CAPS Take 4.5 mg by mouth at bedtime.   4  . methocarbamol (ROBAXIN) 500 MG tablet TAKE (1) TABLET BY MOUTH THREE TIMES DAILY AS NEEDED. (Patient not taking: Reported on 06/27/2017) 90 tablet 0  . metoprolol tartrate (LOPRESSOR) 25 MG tablet TAKE (1) TABLET TWICE DAILY. (Patient not taking: Reported on 06/27/2017) 15 tablet 0    Results for orders placed or performed during the hospital encounter of 07/04/17 (from the past 48 hour(s))  Glucose, capillary     Status: None   Collection Time: 07/04/17  6:42 AM  Result Value Ref Range   Glucose-Capillary 84 65 - 99 mg/dL   No results found.  Review of Systems  Constitutional: Negative for chills and fever.  HENT: Negative for hearing loss.   Eyes: Negative for blurred vision and double vision.  Respiratory: Negative for cough and hemoptysis.   Cardiovascular: Negative for chest pain and palpitations.  Gastrointestinal: Negative for abdominal pain, nausea and vomiting.  Genitourinary: Negative for dysuria and urgency.  Musculoskeletal: Negative for myalgias and neck pain.  Skin: Negative for itching and rash.  Neurological: Negative for dizziness, tingling and headaches.  Endo/Heme/Allergies: Does not bruise/bleed easily.  Psychiatric/Behavioral: Negative for depression and suicidal ideas.    Blood pressure 118/76, pulse 92, temperature 98.8 F (37.1 C), temperature source Oral, resp. rate 18, height 5' 3.5" (1.613 m), weight 134 kg (295 lb 8 oz), last menstrual period 05/21/2017, SpO2 97 %. Physical Exam  Vitals reviewed. Constitutional: She is oriented to person, place, and time. She appears well-developed and well-nourished.  HENT:  Head: Normocephalic and atraumatic.  Eyes: Pupils are equal, round, and reactive to light. Conjunctivae and  EOM are normal.  Neck: Normal range of motion. Neck supple.  Cardiovascular: Normal rate and regular rhythm.  Respiratory: Effort normal and breath sounds normal.  GI: Soft. Bowel sounds are normal. She exhibits no distension. There is no tenderness.  Musculoskeletal: Normal range of motion.  Neurological: She is alert and oriented to person, place, and time.  Skin: Skin is warm and dry.  Psychiatric: She has a normal mood and affect. Her behavior is normal.     Assessment/Plan 42 yo female with morbid obesity, diabetes, and fatty liver disease. -lap RNY gastric bypass -bariatric protocol  Rodman Pickle, MD 07/04/2017, 7:12 AM

## 2017-07-05 ENCOUNTER — Encounter (HOSPITAL_COMMUNITY): Payer: Self-pay | Admitting: General Surgery

## 2017-07-05 LAB — CBC WITH DIFFERENTIAL/PLATELET
BASOS ABS: 0 10*3/uL (ref 0.0–0.1)
BASOS PCT: 0 %
BASOS PCT: 0 %
Basophils Absolute: 0 10*3/uL (ref 0.0–0.1)
EOS PCT: 0 %
Eosinophils Absolute: 0 10*3/uL (ref 0.0–0.7)
Eosinophils Absolute: 0 10*3/uL (ref 0.0–0.7)
Eosinophils Relative: 0 %
HCT: 38.3 % (ref 36.0–46.0)
HCT: 37.3 % (ref 36.0–46.0)
HEMOGLOBIN: 12.5 g/dL (ref 12.0–15.0)
Hemoglobin: 12.7 g/dL (ref 12.0–15.0)
Lymphocytes Relative: 21 %
Lymphocytes Relative: 26 %
Lymphs Abs: 3.3 10*3/uL (ref 0.7–4.0)
Lymphs Abs: 4.8 10*3/uL — ABNORMAL HIGH (ref 0.7–4.0)
MCH: 30.2 pg (ref 26.0–34.0)
MCH: 30.4 pg (ref 26.0–34.0)
MCHC: 33.2 g/dL (ref 30.0–36.0)
MCHC: 33.5 g/dL (ref 30.0–36.0)
MCV: 91 fL (ref 78.0–100.0)
MCV: 90.8 fL (ref 78.0–100.0)
MONO ABS: 1.2 10*3/uL — AB (ref 0.1–1.0)
MONOS PCT: 7 %
Monocytes Absolute: 1.3 10*3/uL — ABNORMAL HIGH (ref 0.1–1.0)
Monocytes Relative: 7 %
NEUTROS ABS: 12 10*3/uL — AB (ref 1.7–7.7)
NEUTROS PCT: 67 %
Neutro Abs: 11.6 10*3/uL — ABNORMAL HIGH (ref 1.7–7.7)
Neutrophils Relative %: 72 %
PLATELETS: 300 10*3/uL (ref 150–400)
Platelets: 308 10*3/uL (ref 150–400)
RBC: 4.21 MIL/uL (ref 3.87–5.11)
RBC: 4.11 MIL/uL (ref 3.87–5.11)
RDW: 13.9 % (ref 11.5–15.5)
RDW: 13.9 % (ref 11.5–15.5)
WBC: 16.1 10*3/uL — ABNORMAL HIGH (ref 4.0–10.5)
WBC: 18.1 10*3/uL — ABNORMAL HIGH (ref 4.0–10.5)

## 2017-07-05 LAB — COMPREHENSIVE METABOLIC PANEL
ALBUMIN: 3.9 g/dL (ref 3.5–5.0)
ALT: 74 U/L — ABNORMAL HIGH (ref 14–54)
ANION GAP: 6 (ref 5–15)
AST: 43 U/L — ABNORMAL HIGH (ref 15–41)
Alkaline Phosphatase: 60 U/L (ref 38–126)
BUN: 6 mg/dL (ref 6–20)
CO2: 26 mmol/L (ref 22–32)
Calcium: 8.6 mg/dL — ABNORMAL LOW (ref 8.9–10.3)
Chloride: 107 mmol/L (ref 101–111)
Creatinine, Ser: 0.64 mg/dL (ref 0.44–1.00)
GFR calc Af Amer: >60 mL/min (ref >60)
GLUCOSE: 126 mg/dL — AB (ref 65–99)
POTASSIUM: 3.9 mmol/L (ref 3.5–5.1)
Sodium: 139 mmol/L (ref 135–145)
TOTAL PROTEIN: 7 g/dL (ref 6.5–8.1)
Total Bilirubin: 0.2 mg/dL — ABNORMAL LOW (ref 0.3–1.2)

## 2017-07-05 LAB — GLUCOSE, CAPILLARY
GLUCOSE-CAPILLARY: 127 mg/dL — AB (ref 65–99)
GLUCOSE-CAPILLARY: 106 mg/dL — AB (ref 65–99)
GLUCOSE-CAPILLARY: 99 mg/dL (ref 65–99)
Glucose-Capillary: 91 mg/dL (ref 65–99)

## 2017-07-05 MED ORDER — PREMIER PROTEIN SHAKE
2.0000 [oz_av] | ORAL | Status: DC
  Administered 2017-07-05 – 2017-07-06 (×6): 2 [oz_av] via ORAL

## 2017-07-05 NOTE — Progress Notes (Signed)
Patient alert and oriented, Post op day 1.  Provided support and encouragement.  Encouraged pulmonary toilet, ambulation and small sips of liquids.  Patient completed 12 ounces of bari clear fluid and 1 ounce of protein shake.  All questions answered.  Will continue to monitor.

## 2017-07-05 NOTE — Progress Notes (Signed)
   Progress Note: Metabolic and Bariatric Surgery Service   Chief Complaint/Subjective: Pain moderate and controlled, no nausea, tolerating water well. +flatus  Objective: Vital signs in last 24 hours: Temp:  [97.7 F (36.5 C)-98.9 F (37.2 C)] 98.7 F (37.1 C) (06/12 0620) Pulse Rate:  [77-100] 77 (06/12 0957) Resp:  [13-18] 17 (06/12 0957) BP: (125-159)/(75-96) 131/75 (06/12 0957) SpO2:  [94 %-100 %] 100 % (06/12 0957) Last BM Date: 07/03/17  Intake/Output from previous day: 06/11 0701 - 06/12 0700 In: 2150.8 [P.O.:280; I.V.:1720.8; IV Piggyback:150] Out: 1525 [Urine:1500; Blood:25] Intake/Output this shift: No intake/output data recorded.  Lungs: CTAB  Cardiovascular: RRR  Abd: soft, ATTP, ND, incisions c/d/i  Extremities: no edema  Neuro: AOx4  Lab Results: CBC  Recent Labs    07/04/17 1127 07/05/17 0452  WBC  --  16.1*  HGB 13.8 12.7  HCT 41.9 38.3  PLT  --  300   BMET Recent Labs    07/05/17 0452  NA 139  K 3.9  CL 107  CO2 26  GLUCOSE 126*  BUN 6  CREATININE 0.64  CALCIUM 8.6*   PT/INR No results for input(s): LABPROT, INR in the last 72 hours. ABG No results for input(s): PHART, HCO3 in the last 72 hours.  Invalid input(s): PCO2, PO2  Studies/Results:  Anti-infectives: Anti-infectives (From admission, onward)   Start     Dose/Rate Route Frequency Ordered Stop   07/04/17 0630  cefoTEtan (CEFOTAN) 2 g in sodium chloride 0.9 % 100 mL IVPB     2 g 200 mL/hr over 30 Minutes Intravenous On call to O.R. 07/04/17 2703 07/04/17 0734      Medications: Scheduled Meds: . acetaminophen (TYLENOL) oral liquid 160 mg/5 mL  650 mg Oral Q6H  . doxepin  25 mg Oral QHS  . enoxaparin (LOVENOX) injection  30 mg Subcutaneous Q12H  . gabapentin  200 mg Oral Q12H  . insulin aspart  0-15 Units Subcutaneous TID WC  . levothyroxine  213.5 mcg Oral QAC breakfast  . protein supplement shake  2 oz Oral Q2H   Continuous Infusions: . sodium chloride 125  mL/hr at 07/05/17 0541  . famotidine (PEPCID) IV 20 mg (07/05/17 0956)   PRN Meds:.clonazePAM, hydrALAZINE, methocarbamol, morphine injection, ondansetron (ZOFRAN) IV, oxyCODONE, simethicone  Assessment/Plan: Patient Active Problem List   Diagnosis Date Noted  . Morbid obesity (HCC) 07/04/2017  . High risk medication use 03/30/2016  . Acute midline low back pain without sciatica 03/30/2016  . Primary osteoarthritis of both knees 01/12/2016  . Spondylosis of lumbar region without myelopathy or radiculopathy 01/12/2016  . Sacroiliitis, not elsewhere classified (HCC) 01/12/2016  . Chronic diarrhea 01/12/2016  . History of gastroesophageal reflux (GERD) 01/12/2016  . History of fatty infiltration of liver 01/12/2016  . Hypertension 05/09/2013  . Palpitations 04/11/2013  . Spondyloarthropathy (HCC) 10/24/2009  . PCOS (polycystic ovarian syndrome) 01/25/2003  . Depression with anxiety 01/25/2003  . GERD (gastroesophageal reflux disease) 01/25/2003  . Hypothyroidism 01/24/2001   s/p Procedure(s): LAPAROSCOPIC ROUX-EN-Y GASTRIC BYPASS WITH UPPER ENDOSCOPY 07/04/2017 [facility] bariatric VTE risk calculator 0.27% <0.4% -recheck CBC midday if stable or downtrending likely home this afternoon -advance to shakes today -continue to ambulate  Disposition:  LOS: 1 day  The patient will be in the hospital for normal postop protocol  Rodman Pickle, MD [phone] [facility]

## 2017-07-06 LAB — GLUCOSE, CAPILLARY: GLUCOSE-CAPILLARY: 98 mg/dL (ref 65–99)

## 2017-07-06 LAB — CBC WITH DIFFERENTIAL/PLATELET
Basophils Absolute: 0 10*3/uL (ref 0.0–0.1)
Basophils Relative: 0 %
EOS ABS: 0.1 10*3/uL (ref 0.0–0.7)
Eosinophils Relative: 1 %
HCT: 36.6 % (ref 36.0–46.0)
Hemoglobin: 12.3 g/dL (ref 12.0–15.0)
LYMPHS ABS: 5.3 10*3/uL — AB (ref 0.7–4.0)
LYMPHS PCT: 41 %
MCH: 30.6 pg (ref 26.0–34.0)
MCHC: 33.6 g/dL (ref 30.0–36.0)
MCV: 91 fL (ref 78.0–100.0)
MONO ABS: 1 10*3/uL (ref 0.1–1.0)
Monocytes Relative: 8 %
Neutro Abs: 6.5 10*3/uL (ref 1.7–7.7)
Neutrophils Relative %: 50 %
PLATELETS: 278 10*3/uL (ref 150–400)
RBC: 4.02 MIL/uL (ref 3.87–5.11)
RDW: 14 % (ref 11.5–15.5)
WBC: 13 10*3/uL — AB (ref 4.0–10.5)

## 2017-07-06 MED ORDER — TRAMADOL HCL 50 MG PO TABS: 50.0000 mg | ORAL_TABLET | Freq: Three times a day (TID) | ORAL | 0 refills | Status: DC | PRN

## 2017-07-06 MED ORDER — ONDANSETRON 4 MG PO TBDP: 4.0000 mg | ORAL_TABLET | Freq: Three times a day (TID) | ORAL | 0 refills | Status: DC | PRN

## 2017-07-06 NOTE — Progress Notes (Signed)
Patient alert and oriented, pain is controlled. Patient is tolerating fluids, advanced to protein shake today, patient is tolerating well. Reviewed Gastric Bypass discharge instructions with patient and patient is able to articulate understanding. Provided information on BELT program, Support Group and [facility] outpatient pharmacy. All questions answered, will continue to monitor.  Total 24 hour fluid recall 567 Per dehydration protocol call back 6/17

## 2017-07-06 NOTE — Progress Notes (Signed)
Assessment unchanged. Pt verbalized understanding of dc instructions including follow up care and when to call the doctor. No scripts at dc. Dawn Williams-James completed bariatric teaching. Discharged via wc to front entrance to meet husband and vehicle to carry home. Accompanied by NT.

## 2017-07-06 NOTE — Discharge Summary (Signed)
Physician Discharge Summary  CAYLYNN MINCHEW HDI:[id number] DOB: [birth year] DOA: 07/04/2017  PCP: Carylon Perches, MD  Admit date: 07/04/2017 Discharge date: 07/06/2017  Recommendations for Outpatient Follow-up:  1.  (include homehealth, outpatient follow-up instructions, specific recommendations for PCP to follow-up on, etc.)  Follow-up Information    Lethia Donlon, De Blanch, MD. Go on 07/19/2017.   Specialty:  General Surgery Why:  at [address] information: [address] [place] [address] [phone]        Virgia Kelner, De Blanch, MD .   Specialty:  General Surgery Contact information: [address] [place] [address] [place] [address] [phone]          Discharge Diagnoses:  Active Problems:   Morbid obesity (HCC)   Surgical Procedure: Laparoscopic Sleeve Gastrectomy, upper endoscopy  Discharge Condition: Good Disposition: Home  Diet recommendation: Postoperative sleeve gastrectomy diet (liquids only)  Filed Weights   07/04/17 0611  Weight: 134 kg (295 lb 8 oz)     Hospital Course:  The patient was admitted after undergoing Roux-en-Y gastric bypass. POD 0 she ambulated well. POD 1 she was started on the water diet protocol and tolerated 300 ml in the first shift. Once meeting the water amount she was advanced to bariatric protein shakes which they tolerated. Her WBC went from 16 to 18 POD 1 so she stayed the night for recheck and POD 2 it was 13 and she was discharged home POD 2.  Treatments: surgery: Roux-en-Y gastric bypass  Discharge Instructions  Discharge Instructions    Ambulate hourly while awake   Complete by:  As directed    Call MD for:  difficulty breathing, headache or visual disturbances   Complete by:  As directed    Call MD for:  persistant dizziness or light-headedness   Complete by:  As directed    Call MD for:  persistant nausea and vomiting   Complete by:  As directed    Call MD for:  redness, tenderness, or signs of  infection (pain, swelling, redness, odor or green/yellow discharge around incision site)   Complete by:  As directed    Call MD for:  severe uncontrolled pain   Complete by:  As directed    Call MD for:  temperature >101 F   Complete by:  As directed    Diet bariatric full liquid   Complete by:  As directed    Discharge wound care:   Complete by:  As directed    Remove Bandaids tomorrow, ok to shower tomorrow. Steristrips may fall off in 1-3 weeks.   Incentive spirometry   Complete by:  As directed    Perform hourly while awake     Allergies as of 07/06/2017      Reactions   Risperdal [risperidone] Anaphylaxis   Latex Rash   Raised red rash at areas of contact      Medication List    TAKE these medications   Armodafinil 150 MG tablet Take 150 mg by mouth every morning.   cholecalciferol 1000 units tablet Commonly known as:  VITAMIN D Take 1,000 Units by mouth daily.   clonazePAM 1 MG tablet Commonly known as:  KLONOPIN Take 1 tablet (1 mg total) by mouth 3 (three) times daily as needed for anxiety.   COSENTYX SENSOREADY 300 DOSE 150 MG/ML Soaj Generic drug:  Secukinumab INJECT 300 MG UNDER THE SKIN (SUBCUTANEOUS INJECTION) EVERY 30 (THIRTY) DAYS.   doxepin 25 MG capsule Commonly known as:  SINEQUAN Take  25 mg by mouth at bedtime.   DULoxetine 60 MG capsule Commonly known as:  CYMBALTA Take 60 mg by mouth daily.   metFORMIN 500 MG tablet Commonly known as:  GLUCOPHAGE Take 500 mg by mouth 2 (two) times daily with a meal. Notes to patient:  Monitor Blood Sugar Frequently and keep a log for primary care physician, you may need to adjust medication dosage with rapid weight loss.     methocarbamol 500 MG tablet Commonly known as:  ROBAXIN TAKE (1) TABLET BY MOUTH THREE TIMES DAILY AS NEEDED. What changed:  Another medication with the same name was removed. Continue taking this medication, and follow the directions you see here.   metoprolol tartrate 25 MG  tablet Commonly known as:  LOPRESSOR TAKE (1) TABLET TWICE DAILY. Notes to patient:  Monitor Blood Pressure Daily and keep a log for primary care physician.  You may need to make changes to your medications with rapid weight loss.     NEXIUM 40 MG capsule Generic drug:  esomeprazole Take 40 mg by mouth daily.   ondansetron 4 MG disintegrating tablet Commonly known as:  ZOFRAN ODT Take 1 tablet (4 mg total) by mouth every 8 (eight) hours as needed for nausea or vomiting.   SYNTHROID 112 MCG tablet Generic drug:  levothyroxine Take 214 mcg by mouth daily before breakfast.   traMADol 50 MG tablet Commonly known as:  ULTRAM Take 1 tablet (50 mg total) by mouth every 8 (eight) hours as needed for up to 1 dose.   VRAYLAR 4.5 MG Caps Generic drug:  Cariprazine HCl Take 4.5 mg by mouth at bedtime.            Discharge Care Instructions  (From admission, onward)        Start     Ordered   07/06/17 0000  Discharge wound care:    Comments:  Remove Bandaids tomorrow, ok to shower tomorrow. Steristrips may fall off in 1-3 weeks.   07/06/17 0757     Follow-up Information    Rateel Beldin, De Blanch, MD. Go on 07/19/2017.   Specialty:  General Surgery Why:  at [address] information: [address] [place] [address] [phone]        Illyanna Petillo, De Blanch, MD .   Specialty:  General Surgery Contact information: [address] [place] [address] [place] [address] [phone]            The results of significant diagnostics from this hospitalization (including imaging, microbiology, ancillary and laboratory) are listed below for reference.    Significant Diagnostic Studies: No results found.  Labs: Basic Metabolic Panel: Recent Labs  Lab 06/29/17 1511 07/05/17 0452  NA 138 139  K 4.6 3.9  CL 103 107  CO2 25 26  GLUCOSE 110* 126*  BUN 14 6  CREATININE 0.81 0.64  CALCIUM 9.3 8.6*   Liver Function Tests: Recent Labs  Lab 07/05/17 0452  AST  43*  ALT 74*  ALKPHOS 60  BILITOT 0.2*  PROT 7.0  ALBUMIN 3.9    CBC: Recent Labs  Lab 06/29/17 1511 07/04/17 1127 07/05/17 0452 07/05/17 1029 07/06/17 0449  WBC 11.5*  --  16.1* 18.1* 13.0*  NEUTROABS  --   --  11.6* 12.0* 6.5  HGB 13.9 13.8 12.7 12.5 12.3  HCT 41.1 41.9 38.3 37.3 36.6  MCV 90.3  --  91.0 90.8 91.0  PLT 344  --  300 308 278    CBG: Recent  Labs  Lab 07/04/17 1951 07/05/17 0721 07/05/17 1138 07/05/17 1630 07/05/17 2301  GLUCAP 179* 127* 91 106* 99    Active Problems:   Morbid obesity (HCC)   Time coordinating discharge: 

## 2017-07-13 DIAGNOSIS — E039 Hypothyroidism, unspecified: Secondary | ICD-10-CM | POA: Diagnosis not present

## 2017-07-13 DIAGNOSIS — E1165 Type 2 diabetes mellitus with hyperglycemia: Secondary | ICD-10-CM | POA: Diagnosis not present

## 2017-07-13 DIAGNOSIS — E782 Mixed hyperlipidemia: Secondary | ICD-10-CM | POA: Diagnosis not present

## 2017-07-18 ENCOUNTER — Encounter: Payer: Medicare Other | Attending: General Surgery | Admitting: Registered"

## 2017-07-18 ENCOUNTER — Telehealth (HOSPITAL_COMMUNITY): Payer: Self-pay | Admitting: *Deleted

## 2017-07-18 DIAGNOSIS — Z6841 Body Mass Index (BMI) 40.0 and over, adult: Secondary | ICD-10-CM | POA: Diagnosis not present

## 2017-07-18 DIAGNOSIS — E669 Obesity, unspecified: Secondary | ICD-10-CM

## 2017-07-18 DIAGNOSIS — Z713 Dietary counseling and surveillance: Secondary | ICD-10-CM | POA: Diagnosis not present

## 2017-07-18 DIAGNOSIS — E119 Type 2 diabetes mellitus without complications: Secondary | ICD-10-CM

## 2017-07-18 NOTE — Telephone Encounter (Signed)
Patient called to discuss post bariatric surgery follow up questions.  See below:   1.  Tell me about your pain and pain management? Taking 1 a day pain med' "sore"  2.  Let's talk about fluid intake.  How much total fluid are you taking in? 54 oz fluid plus shakes  3.  How much protein have you taken in the last 2 days? 60 gm each day  4.  Have you had nausea?  Tell me about when have experienced nausea and what you did to help? Nausea only first day at home and none since  5.  Has the frequency or color changed with your urine? Urine is light  6.  Tell me what your incisions look like? No bruising, redness or swelling  7.  Have you been passing gas? BM? Several bms since being home and passing gas  8.  If a problem or question were to arise who would you call?  Do you know contact numbers for [facility], [facility], and [facility]? Yes she has numbers or dr on call  9.  How has the walking going? About 100 feet several times a day  10.  How are your vitamins and calcium going?  How are you taking them? Taking all mvi and calcium- little bit of a struggle since on syntroid but getting them in

## 2017-07-19 NOTE — Progress Notes (Signed)
Bariatric Class:  Appt start time: 1530 end time:  1630.  2 Week Post-Operative Nutrition Class  Patient was seen on 07/18/2017 for Post-Operative Nutrition [facility] at [facility].   Surgery date: 07/04/2017 Surgery type: RYGB Start weight at [facility]: 310 Weight today: 287.4 Weight change: 22.6  Pt states she consuming about 48-60 fluid ounces a day. Pt states she has experienced some gas and a little abdominal pain. Pt states she is checking BS once a day: 90-118. Pt reports last A1c was 6.2%, reduction from previous 7%.   TANITA  BODY COMP RESULTS  07/19/2017   BMI (kg/m^2) 50.1   Fat Mass (lbs) 161.0   Fat Free Mass (lbs) 126.4   Total Body Water (lbs) 94.6   The following the learning objectives were met by the patient during this course:  Identifies Phase 3A (Soft, High Proteins) Dietary Goals and will begin from 2 weeks post-operatively to 2 months post-operatively  Identifies appropriate sources of fluids and proteins   States protein recommendations and appropriate sources post-operatively  Identifies the need for appropriate texture modifications, mastication, and bite sizes when consuming solids  Identifies appropriate multivitamin and calcium sources post-operatively  Describes the need for physical activity post-operatively and will follow MD recommendations  States when to call healthcare provider regarding medication questions or post-operative complications  Handouts given during class include:  Phase 3A: Soft, High Protein Diet Handout  Follow-Up Plan: Patient will follow-up at [facility] in 6 weeks for 2 month post-op nutrition visit for diet advancement per MD.

## 2017-07-20 DIAGNOSIS — E782 Mixed hyperlipidemia: Secondary | ICD-10-CM | POA: Diagnosis not present

## 2017-07-20 DIAGNOSIS — E1165 Type 2 diabetes mellitus with hyperglycemia: Secondary | ICD-10-CM | POA: Diagnosis not present

## 2017-07-20 DIAGNOSIS — E039 Hypothyroidism, unspecified: Secondary | ICD-10-CM | POA: Diagnosis not present

## 2017-07-20 DIAGNOSIS — R1011 Right upper quadrant pain: Secondary | ICD-10-CM | POA: Diagnosis not present

## 2017-07-20 DIAGNOSIS — E282 Polycystic ovarian syndrome: Secondary | ICD-10-CM | POA: Diagnosis not present

## 2017-07-20 DIAGNOSIS — Z8719 Personal history of other diseases of the digestive system: Secondary | ICD-10-CM | POA: Diagnosis not present

## 2017-07-20 DIAGNOSIS — K8020 Calculus of gallbladder without cholecystitis without obstruction: Secondary | ICD-10-CM | POA: Diagnosis not present

## 2017-07-20 DIAGNOSIS — E559 Vitamin D deficiency, unspecified: Secondary | ICD-10-CM | POA: Diagnosis not present

## 2017-07-21 ENCOUNTER — Telehealth: Payer: Self-pay | Admitting: Registered"

## 2017-07-21 DIAGNOSIS — Z8719 Personal history of other diseases of the digestive system: Secondary | ICD-10-CM | POA: Diagnosis not present

## 2017-07-21 DIAGNOSIS — R1011 Right upper quadrant pain: Secondary | ICD-10-CM | POA: Diagnosis not present

## 2017-07-21 DIAGNOSIS — K8020 Calculus of gallbladder without cholecystitis without obstruction: Secondary | ICD-10-CM | POA: Diagnosis not present

## 2017-07-21 NOTE — Telephone Encounter (Signed)
RD called pt to verify fluid intake once starting soft, solid proteins 2 week post-bariatric surgery. Pt was unavailable. RD left voicemail.     

## 2017-07-22 DIAGNOSIS — K8020 Calculus of gallbladder without cholecystitis without obstruction: Secondary | ICD-10-CM | POA: Diagnosis not present

## 2017-07-22 DIAGNOSIS — Z8719 Personal history of other diseases of the digestive system: Secondary | ICD-10-CM | POA: Diagnosis not present

## 2017-07-22 DIAGNOSIS — R1011 Right upper quadrant pain: Secondary | ICD-10-CM | POA: Diagnosis not present

## 2017-08-30 ENCOUNTER — Encounter: Payer: Self-pay | Admitting: Skilled Nursing Facility1

## 2017-08-30 ENCOUNTER — Encounter: Payer: PPO | Attending: General Surgery | Admitting: Skilled Nursing Facility1

## 2017-08-30 DIAGNOSIS — E669 Obesity, unspecified: Secondary | ICD-10-CM

## 2017-08-30 DIAGNOSIS — Z713 Dietary counseling and surveillance: Secondary | ICD-10-CM | POA: Diagnosis not present

## 2017-08-30 DIAGNOSIS — Z6841 Body Mass Index (BMI) 40.0 and over, adult: Secondary | ICD-10-CM | POA: Diagnosis not present

## 2017-08-30 NOTE — Progress Notes (Signed)
Follow-up visit:  8 Weeks Post-Operative RYGB Surgery  Primary concerns today: Post-operative Bariatric Surgery Nutrition Management.  Surgery date: 07/04/2017 Surgery type: RYGB Start weight at [facility]: 310 Weight today: 287.4 Weight change: 22.6  TANITA  BODY COMP RESULTS  07/19/2017 08/30/2017   BMI (kg/m^2) 50.1 47.8   Fat Mass (lbs) 161.0 146   Fat Free Mass (lbs) 126.4 123.8   Total Body Water (lbs) 94.6 92    Pt states she consuming about 48-60 fluid ounces a day. Pt states she has experienced some gas and a little abdominal pain. Pt states she is checking BS once a day: 90-118. Pt reports last A1c was 6.2%, reduction from previous 7%.   Pt state she is feeling good with adequate energy and good sleep. Pt states she gets tired the next day after a busy day.   24-hr recall: B (AM): protein shake Snk (AM): cottage cheese L (PM): ham and cheese roll up and greek yogurt Snk (PM):  [place] yogrt  D (PM): pinto beans or pork chop or salmon or meatloaf Snk (PM):   Fluid intake: plain water: 64 ounces  Estimated total protein intake: 60+  Medications: See List Supplementation: celebrate chewable and calcium  CBG monitoring: 1 time a week Average CBG per patient: 95's Last patient reported A1c: 6.2  Using straws: no Drinking while eating: no Having you been chewing well:no Chewing/swallowing difficulties: no Changes in vision: no Changes to mood/headaches: no Hair loss/Cahnges to skin/Changes to nails: no Any difficulty focusing or concentrating: no Sweating: no Dizziness/Lightheaded: no Palpitations: no  Carbonated beverages: no N/V/D/C/GAS: no, bowel movement every 2 days Abdominal Pain: no Dumping syndrome: no  Recent physical activity:  Walking more than she was   Progress Towards Goal(s):  In progress.  Handouts given during visit include:  Non starchy veggies + protein   Nutritional Diagnosis:  [place]-3.3 Overweight/obesity related to past poor dietary  habits and physical inactivity as evidenced by patient w/ recent RYGB surgery following dietary guidelines for continued weight loss.  Intervention:  Nutrition cousneling. Dietitian educated pt on advancing her diet to include non starchy veggies. Goals: -Start out with soft cooked vegetables for today and tomorrow and then if no issue have them any way you like them -Goal: to have vegetables with every lunch every dinner 7 days a week -Keep up the great work!!  Teaching Method Utilized:  Visual Auditory Hands on  Barriers to learning/adherence to lifestyle change: none stated  Demonstrated degree of understanding via:  Teach Back   Monitoring/Evaluation:  Dietary intake, exercise, and body weight.

## 2017-09-15 DIAGNOSIS — F3162 Bipolar disorder, current episode mixed, moderate: Secondary | ICD-10-CM | POA: Diagnosis not present

## 2017-09-15 DIAGNOSIS — G4730 Sleep apnea, unspecified: Secondary | ICD-10-CM | POA: Diagnosis not present

## 2017-10-02 ENCOUNTER — Telehealth: Payer: Self-pay | Admitting: Rheumatology

## 2017-10-02 NOTE — Telephone Encounter (Signed)
Per CovermyMeds, no PA required. Ran test claim, copay is over $600. Called patient to discuss patient assistance program through [employer]. Patient is interested and will call to initiate the process. Will follow up.  [employer]# [phone]  11:21 AM Dorthula Nettles, CPhT

## 2017-10-02 NOTE — Telephone Encounter (Addendum)
Patient states her insurance has changed, and Cosentyx now requires a prior [place]. Please call patient to discuss. Patient now has [employer]	[place] which is in the system.

## 2017-10-03 ENCOUNTER — Telehealth: Payer: Self-pay | Admitting: Pharmacy Technician

## 2017-10-03 NOTE — Telephone Encounter (Signed)
Received a fax from [employer] regarding a prior authorization for Cosentyx 300mg . Authorization has been APPROVED from 10/03/2017 to 10/03/2018.   Will send document to scan center.   Phone # [phone]  2:58 PM [phone], CPhT

## 2017-10-03 NOTE — Telephone Encounter (Signed)
Called [employer] to confirm PA is not required. Per Rep Tyler Aas, there is currently a transitional override on patient's plan since she is new. Will need prior authorization after Sept 30th.   Patient's copay is so high because she is currently in a coverage gap- copay is $692.  PA documents will be faxed over to office for submission.  Phone- [phone]  8:57 AM Dorthula Nettles, CPhT

## 2017-10-03 NOTE — Telephone Encounter (Signed)
Patient called [employer] and was prescreened. She is eligible for 90 days of medication while the application process is going on. Patient was told that they will fax documents to office and mail her documents to complete. Will follow up  3:03 PM Dorthula Nettles, CPhT

## 2017-10-03 NOTE — Telephone Encounter (Signed)
Received a Prior Authorization request from [employer] for Cosentyx. Authorization has been submitted to patient's insurance via Fax. Will update once we receive a response.  Fax# [phone]  12:18 PM Dorthula Nettles, CPhT

## 2017-10-04 DIAGNOSIS — G5601 Carpal tunnel syndrome, right upper limb: Secondary | ICD-10-CM | POA: Diagnosis not present

## 2017-10-04 DIAGNOSIS — L814 Other melanin hyperpigmentation: Secondary | ICD-10-CM | POA: Diagnosis not present

## 2017-10-04 DIAGNOSIS — M47812 Spondylosis without myelopathy or radiculopathy, cervical region: Secondary | ICD-10-CM | POA: Diagnosis not present

## 2017-10-04 DIAGNOSIS — T148XXA Other injury of unspecified body region, initial encounter: Secondary | ICD-10-CM | POA: Diagnosis not present

## 2017-10-04 DIAGNOSIS — N7689 Other specified inflammation of vagina and vulva: Secondary | ICD-10-CM | POA: Diagnosis not present

## 2017-10-04 NOTE — Telephone Encounter (Signed)
[employer], Spoke to [employer], he confirmed the patient has been preapproved for 3 months of medication. Just needs a prescription from the clinic. Faxed over prescription form. He also advised that the patient has been mailed some forms to complete with income documentation and return to be fully approved. Patient is aware. Will follow up.  Prescreen approval will be sent to the office.  Phone-[phone] Fax- [phone]  1:34 PM Dorthula Nettles, CPhT

## 2017-10-05 ENCOUNTER — Other Ambulatory Visit: Payer: Self-pay | Admitting: Rheumatology

## 2017-10-05 NOTE — Telephone Encounter (Addendum)
Last Visit: 05/30/17 Next Visit: 10/31/17 Labs: 07/05/17  TB Gold: 05/17/17 Neg   Patient advised she is due for labs. Patient will update this week.  Okay to refill 30 day supply per Dr. Corliss Skains

## 2017-10-06 ENCOUNTER — Telehealth: Payer: Self-pay | Admitting: Rheumatology

## 2017-10-06 ENCOUNTER — Other Ambulatory Visit: Payer: Self-pay

## 2017-10-06 DIAGNOSIS — Z79899 Other long term (current) drug therapy: Secondary | ICD-10-CM

## 2017-10-06 LAB — COMPLETE METABOLIC PANEL WITH GFR
AG RATIO: 1.4 (calc) (ref 1.0–2.5)
ALBUMIN MSPROF: 4.2 g/dL (ref 3.6–5.1)
ALT: 41 U/L — ABNORMAL HIGH (ref 6–29)
AST: 32 U/L — AB (ref 10–30)
Alkaline phosphatase (APISO): 94 U/L (ref 33–115)
BUN: 7 mg/dL (ref 7–25)
CALCIUM: 9.7 mg/dL (ref 8.6–10.2)
CO2: 29 mmol/L (ref 20–32)
CREATININE: 0.72 mg/dL (ref 0.50–1.10)
Chloride: 102 mmol/L (ref 98–110)
GFR, EST AFRICAN AMERICAN: 121 mL/min/{1.73_m2} (ref >=60)
GFR, EST NON AFRICAN AMERICAN: 105 mL/min/{1.73_m2} (ref >=60)
Globulin: 3 g/dL (calc) (ref 1.9–3.7)
Glucose, Bld: 95 mg/dL (ref 65–139)
Potassium: 4.5 mmol/L (ref 3.5–5.3)
Sodium: 139 mmol/L (ref 135–146)
TOTAL PROTEIN: 7.2 g/dL (ref 6.1–8.1)
Total Bilirubin: 0.3 mg/dL (ref 0.2–1.2)

## 2017-10-06 LAB — CBC WITH DIFFERENTIAL/PLATELET
BASOS ABS: 73 {cells}/uL (ref 0–200)
Basophils Relative: 0.7 %
EOS ABS: 260 {cells}/uL (ref 15–500)
Eosinophils Relative: 2.5 %
HCT: 42 % (ref 35.0–45.0)
HEMOGLOBIN: 14 g/dL (ref 11.7–15.5)
Lymphs Abs: 3432 cells/uL (ref 850–3900)
MCH: 28.6 pg (ref 27.0–33.0)
MCHC: 33.3 g/dL (ref 32.0–36.0)
MCV: 85.9 fL (ref 80.0–100.0)
MPV: 11 fL (ref 7.5–12.5)
Monocytes Relative: 6.9 %
Neutro Abs: 5918 cells/uL (ref 1500–7800)
Neutrophils Relative %: 56.9 %
Platelets: 372 10*3/uL (ref 140–400)
RBC: 4.89 10*6/uL (ref 3.80–5.10)
RDW: 13.2 % (ref 11.0–15.0)
TOTAL LYMPHOCYTE: 33 %
WBC: 10.4 10*3/uL (ref 3.8–10.8)
WBCMIX: 718 {cells}/uL (ref 200–950)

## 2017-10-06 NOTE — Telephone Encounter (Addendum)
Patient is actually at [facility] in [place] on [address]. Please release orders. Labs released per Geroge Baseman.

## 2017-10-06 NOTE — Telephone Encounter (Signed)
Lab Orders released.  

## 2017-10-06 NOTE — Telephone Encounter (Signed)
Per patient, please release orders to [facility] in [place] on [address]. Patient going in the next hour.

## 2017-10-09 NOTE — Progress Notes (Signed)
LFTs are better 

## 2017-10-13 NOTE — Telephone Encounter (Signed)
Patient received documents from [employer] to complete and return, she is calling to schedule 1st shipment today. Will call back if she has any issues.

## 2017-10-16 DIAGNOSIS — E782 Mixed hyperlipidemia: Secondary | ICD-10-CM | POA: Diagnosis not present

## 2017-10-16 DIAGNOSIS — E039 Hypothyroidism, unspecified: Secondary | ICD-10-CM | POA: Diagnosis not present

## 2017-10-16 DIAGNOSIS — E1165 Type 2 diabetes mellitus with hyperglycemia: Secondary | ICD-10-CM | POA: Diagnosis not present

## 2017-10-17 NOTE — Progress Notes (Deleted)
Office Visit Note  Patient: Allison Atkinson             Date of Birth: March 09, 1975           MRN: [id number]             PCP: Carylon Perches, MD Referring: Carylon Perches, MD Visit Date: 10/31/2017 Occupation: @GUAROCC @  Subjective:  No chief complaint on file.   History of Present Illness: Allison Atkinson is a 42 y.o. female ***   Activities of Daily Living:  Patient reports morning stiffness for *** {minute/hour:19697}.   Patient {ACTIONS;DENIES/REPORTS:21021675::"Denies"} nocturnal pain.  Difficulty dressing/grooming: {ACTIONS;DENIES/REPORTS:21021675::"Denies"} Difficulty climbing stairs: {ACTIONS;DENIES/REPORTS:21021675::"Denies"} Difficulty getting out of chair: {ACTIONS;DENIES/REPORTS:21021675::"Denies"} Difficulty using hands for taps, buttons, cutlery, and/or writing: {ACTIONS;DENIES/REPORTS:21021675::"Denies"}  No Rheumatology ROS completed.   PMFS History:  Patient Active Problem List   Diagnosis Date Noted  . Morbid obesity (HCC) 07/04/2017  . High risk medication use 03/30/2016  . Acute midline low back pain without sciatica 03/30/2016  . Primary osteoarthritis of both knees 01/12/2016  . Spondylosis of lumbar region without myelopathy or radiculopathy 01/12/2016  . Sacroiliitis, not elsewhere classified (HCC) 01/12/2016  . Chronic diarrhea 01/12/2016  . History of gastroesophageal reflux (GERD) 01/12/2016  . History of fatty infiltration of liver 01/12/2016  . Hypertension 05/09/2013  . Palpitations 04/11/2013  . Spondyloarthropathy (HCC) 10/24/2009  . PCOS (polycystic ovarian syndrome) 01/25/2003  . Depression with anxiety 01/25/2003  . GERD (gastroesophageal reflux disease) 01/25/2003  . Hypothyroidism 01/24/2001    Past Medical History:  Diagnosis Date  . Anxiety   . Bipolar disorder (HCC)   . Diabetes mellitus without complication (HCC) 05/2017  . Fatty liver disease, nonalcoholic   . History of cholecystectomy   . Hypertension   . Polycystic ovarian  disease   . RA (rheumatoid arthritis) (HCC)   . Thyroid disease    hypothyroid    Family History  Problem Relation Age of Onset  . Hypertension Mother   . Alcohol abuse Father    Past Surgical History:  Procedure Laterality Date  . CHOLECYSTECTOMY  1999  . COLONOSCOPY W/ BIOPSIES  2010  . ESOPHAGOGASTRODUODENOSCOPY  2010  . 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 KNEE ARTHROSCOPY Right    Social History   Social History Narrative  . Not on file    Objective: Vital Signs: There were no vitals taken for this visit.   Physical Exam   Musculoskeletal Exam: ***  CDAI Exam: CDAI Score: Not documented Patient Global Assessment: Not documented; Provider Global Assessment: Not documented Swollen: Not documented; Tender: Not documented Joint Exam   Not documented   There is currently no information documented on the homunculus. Go to the Rheumatology activity and complete the homunculus joint exam.  Investigation: No additional findings.  Imaging: No results found.  Recent Labs: Lab Results  Component Value Date   WBC 10.4 10/06/2017   HGB 14.0 10/06/2017   PLT 372 10/06/2017   NA 139 10/06/2017   K 4.5 10/06/2017   CL 102 10/06/2017   CO2 29 10/06/2017   GLUCOSE 95 10/06/2017   BUN 7 10/06/2017   CREATININE 0.72 10/06/2017   BILITOT 0.3 10/06/2017   ALKPHOS 60 07/05/2017   AST 32 (H) 10/06/2017   ALT 41 (H) 10/06/2017   PROT 7.2 10/06/2017   ALBUMIN 3.9 07/05/2017  CALCIUM 9.7 10/06/2017   GFRAA 121 10/06/2017   QFTBGOLDPLUS NEGATIVE 05/17/2017    Speciality Comments: No specialty comments available.  Procedures:  No procedures performed Allergies: Risperdal [risperidone] and Latex   Assessment / Plan:     Visit Diagnoses: No diagnosis found.   Orders: No orders of the defined types  were placed in this encounter.  No orders of the defined types were placed in this encounter.   Face-to-face time spent with patient was *** minutes. Greater than 50% of time was spent in counseling and coordination of care.  Follow-Up Instructions: No follow-ups on file.   Ellen Henri, CMA  Note - This record has been created using Animal nutritionist.  Chart creation errors have been sought, but may not always  have been located. Such creation errors do not reflect on  the standard of medical care.

## 2017-10-19 DIAGNOSIS — E1165 Type 2 diabetes mellitus with hyperglycemia: Secondary | ICD-10-CM | POA: Diagnosis not present

## 2017-10-19 DIAGNOSIS — I10 Essential (primary) hypertension: Secondary | ICD-10-CM | POA: Diagnosis not present

## 2017-10-19 DIAGNOSIS — Z794 Long term (current) use of insulin: Secondary | ICD-10-CM | POA: Diagnosis not present

## 2017-10-19 DIAGNOSIS — E782 Mixed hyperlipidemia: Secondary | ICD-10-CM | POA: Diagnosis not present

## 2017-10-19 DIAGNOSIS — E282 Polycystic ovarian syndrome: Secondary | ICD-10-CM | POA: Diagnosis not present

## 2017-10-19 DIAGNOSIS — E039 Hypothyroidism, unspecified: Secondary | ICD-10-CM | POA: Diagnosis not present

## 2017-10-19 DIAGNOSIS — E559 Vitamin D deficiency, unspecified: Secondary | ICD-10-CM | POA: Diagnosis not present

## 2017-10-23 DIAGNOSIS — L723 Sebaceous cyst: Secondary | ICD-10-CM | POA: Diagnosis not present

## 2017-10-24 NOTE — Telephone Encounter (Signed)
Received fax from [employer], patient has been approved for Cosentyx assistance through 01/23/18.  Will send document to scan center.  Pt ID# [id number] Phone# [phone]  3:25 PM Dorthula Nettles, CPhT

## 2017-10-25 DIAGNOSIS — M47812 Spondylosis without myelopathy or radiculopathy, cervical region: Secondary | ICD-10-CM | POA: Diagnosis not present

## 2017-10-25 DIAGNOSIS — G5601 Carpal tunnel syndrome, right upper limb: Secondary | ICD-10-CM | POA: Diagnosis not present

## 2017-10-25 DIAGNOSIS — R531 Weakness: Secondary | ICD-10-CM | POA: Diagnosis not present

## 2017-10-25 DIAGNOSIS — R200 Anesthesia of skin: Secondary | ICD-10-CM | POA: Diagnosis not present

## 2017-10-30 ENCOUNTER — Encounter: Payer: Self-pay | Admitting: Skilled Nursing Facility1

## 2017-10-30 ENCOUNTER — Telehealth: Payer: Self-pay | Admitting: Pharmacy Technician

## 2017-10-30 ENCOUNTER — Encounter: Payer: PPO | Attending: General Surgery | Admitting: Skilled Nursing Facility1

## 2017-10-30 DIAGNOSIS — Z713 Dietary counseling and surveillance: Secondary | ICD-10-CM | POA: Diagnosis not present

## 2017-10-30 DIAGNOSIS — Z6841 Body Mass Index (BMI) 40.0 and over, adult: Secondary | ICD-10-CM | POA: Insufficient documentation

## 2017-10-30 NOTE — Progress Notes (Signed)
Follow-up visit:  8 Weeks Post-Operative RYGB Surgery  Primary concerns today: Post-operative Bariatric Surgery Nutrition Management.  Pt states she had one episode dry chicken but that is it with no complaints.   Surgery date: 07/04/2017 Surgery type: RYGB Start weight at [facility]: 310 Weight today: 239.6 Weight change: 47.8  TANITA  BODY COMP RESULTS  07/19/2017 08/30/2017 10/30/2017   BMI (kg/m^2) 50.1 47.8 41.8   Fat Mass (lbs) 161.0 146 123.2   Fat Free Mass (lbs) 126.4 123.8 116.4   Total Body Water (lbs) 94.6 92 85.6   Husbands: TANITA  BODY COMP RESULTS  10/30/2017   BMI (kg/m^2) 24.7   Fat Mass (lbs) 28.8   Fat Free Mass (lbs) 158.6   Total Body Water (lbs) 109.8    24-hr recall: 7 days a  Week avg 5 meals a day B (AM): protein shake (due to easier) sometimes egg Snk (AM): cottage cheese L (PM): ham and cheese roll up and broccoli Snk (PM):  [place] yogrt  D (PM): pinto beans or pork chop or salmon or meatloaf or chicken with non-starchy veggie Snk (PM):   Fluid intake: plain water: 101 ounces  Estimated total protein intake: 60+  Medications: See List Supplementation: celebrate capsule with iron and calcium  CBG monitoring: 1 time a week Average CBG per patient: 70's Last patient reported A1c: 6.2  Using straws: no Drinking while eating: no Having you been chewing well:no Chewing/swallowing difficulties: no Changes in vision: no Changes to mood/headaches: no Hair loss/Cahnges to skin/Changes to nails: no Any difficulty focusing or concentrating: no Sweating: no Dizziness/Lightheaded: no Palpitations: no  Carbonated beverages: no N/V/D/C/GAS: no Abdominal Pain: no Dumping syndrome: no  Recent physical activity:  Walking more than she was and some crunches   Progress Towards Goal(s):  In progress.   Nutritional Diagnosis:  [place]-3.3 Overweight/obesity related to past poor dietary habits and physical inactivity as evidenced by patient w/ recent RYGB  surgery following dietary guidelines for continued weight loss.  Intervention:  Nutrition cousneling. Dietitian educated pt on advancing her diet to include non starchy veggies. Goals: -Start out with soft cooked vegetables for today and tomorrow and then if no issue have them any way you like them -Goal: to have vegetables with every lunch every dinner 7 days a week -Keep up the great work!!  Teaching Method Utilized:  Visual Auditory Hands on  Barriers to learning/adherence to lifestyle change: none stated  Demonstrated degree of understanding via:  Teach Back   Monitoring/Evaluation:  Dietary intake, exercise, and body weight.

## 2017-10-30 NOTE — Telephone Encounter (Signed)
Left message for patient, that I will initiate 2020 renewal application for Cosentyx. Patient will need to bring in income documents to upcoming appointment.  12:37 PM Dorthula Nettles, CPhT

## 2017-10-31 ENCOUNTER — Ambulatory Visit: Payer: BLUE CROSS/BLUE SHIELD | Admitting: Rheumatology

## 2017-11-02 NOTE — Progress Notes (Signed)
Office Visit Note  Patient: Allison Atkinson             Date of Birth: May 02, 1975           MRN: [id number]             PCP: Carylon Perches, MD Referring: Carylon Perches, MD Visit Date: 11/14/2017 Occupation: @GUAROCC @  Subjective:  Bilateral knee pain   History of Present Illness: Allison Atkinson is a 42 y.o. female with history of spondyloarthropathy and osteoarthritis.  Patient is on Cosentyx 300 mg subcutaneous  injections once monthly.  She reports that she has no significant improvement since switching from Humira to Cosentyx.  She denies any breakthrough pain and feels that it is effective for the entire month.  She states that her lower back pain has improved significantly.  She denies any SI joint pain at this time.  She denies any eye inflammation, achilles tendonitis, or plantar fasciitis. She reports that she is having pain in bilateral knee joints but denies any joint swelling.  She would like to reapply for Visco gel injections.  She denies any other concerns at this time.    Activities of Daily Living:  Patient reports morning stiffness for 1 hour.   Patient Denies nocturnal pain.  Difficulty dressing/grooming: Reports Difficulty climbing stairs: Reports Difficulty getting out of chair: Reports Difficulty using hands for taps, buttons, cutlery, and/or writing: Denies  Review of Systems  Constitutional: Positive for fatigue.  HENT: Positive for mouth dryness. Negative for mouth sores and nose dryness.   Eyes: Negative for pain, visual disturbance and dryness.  Respiratory: Negative for cough, hemoptysis, shortness of breath and difficulty breathing.   Cardiovascular: Positive for swelling in legs/feet. Negative for chest pain, palpitations and hypertension.  Gastrointestinal: Positive for constipation and diarrhea. Negative for blood in stool.  Endocrine: Negative for increased urination.  Genitourinary: Negative for difficulty urinating and painful urination.    Musculoskeletal: Positive for arthralgias, joint pain, joint swelling, muscle weakness and morning stiffness. Negative for myalgias, muscle tenderness and myalgias.  Skin: Negative for color change, pallor, rash, hair loss, nodules/bumps, skin tightness, ulcers and sensitivity to sunlight.  Allergic/Immunologic: Negative for susceptible to infections.  Neurological: Negative for dizziness and headaches.  Hematological: Negative for bruising/bleeding tendency and swollen glands.  Psychiatric/Behavioral: Positive for sleep disturbance. Negative for depressed mood. The patient is not nervous/anxious.     PMFS History:  Patient Active Problem List   Diagnosis Date Noted  . Morbid obesity (HCC) 07/04/2017  . High risk medication use 03/30/2016  . Acute midline low back pain without sciatica 03/30/2016  . Primary osteoarthritis of both knees 01/12/2016  . Spondylosis of lumbar region without myelopathy or radiculopathy 01/12/2016  . Sacroiliitis, not elsewhere classified (HCC) 01/12/2016  . Chronic diarrhea 01/12/2016  . History of gastroesophageal reflux (GERD) 01/12/2016  . History of fatty infiltration of liver 01/12/2016  . Hypertension 05/09/2013  . Palpitations 04/11/2013  . Spondyloarthropathy (HCC) 10/24/2009  . PCOS (polycystic ovarian syndrome) 01/25/2003  . Depression with anxiety 01/25/2003  . GERD (gastroesophageal reflux disease) 01/25/2003  . Hypothyroidism 01/24/2001    Past Medical History:  Diagnosis Date  . Anxiety   . Bipolar disorder (HCC)   . Diabetes mellitus without complication (HCC) 05/2017  . Fatty liver disease, nonalcoholic   . History of cholecystectomy   . Hypertension   . Polycystic ovarian disease   . RA (rheumatoid arthritis) (HCC)   . Thyroid disease    hypothyroid  Family History  Problem Relation Age of Onset  . Hypertension Mother   . Alcohol abuse Father    Past Surgical History:  Procedure Laterality Date  . CHOLECYSTECTOMY  1999   . COLONOSCOPY W/ BIOPSIES  2010  . ESOPHAGOGASTRODUODENOSCOPY  2010  . 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 KNEE ARTHROPLASTY    . KNEE ARTHROSCOPY Right    Social History   Social History Narrative  . Not on file    Objective: Vital Signs: BP 101/71 (BP Location: Left Arm, Patient Position: Sitting, Cuff Size: Normal)   Pulse 96   Resp 16   Ht 5' 3.5" (1.613 m)   Wt 238 lb (108 kg)   LMP 11/06/2017   BMI 41.50 kg/m    Physical Exam  Constitutional: She is oriented to person, place, and time. She appears well-developed and well-nourished.  HENT:  Head: Normocephalic and atraumatic.  No parotid swelling.  No oral or nasal ulcerations.  Eyes: Conjunctivae and EOM are normal.  Neck: Normal range of motion.  Cardiovascular: Normal rate, regular rhythm, normal heart sounds and intact distal pulses.  Pulmonary/Chest: Effort normal and breath sounds normal.  Abdominal: Soft. Bowel sounds are normal.  Lymphadenopathy:    She has no cervical adenopathy.  Neurological: She is alert and oriented to person, place, and time.  Skin: Skin is warm and dry. Capillary refill takes less than 2 seconds.  No malar rash. No digital ulcerations or signs of gangrene.  Psychiatric: She has a normal mood and affect. Her behavior is normal.  Nursing note and vitals reviewed.    Musculoskeletal Exam: C-spine, thoracic spine, and lumbar spine good ROM. Midline spinal tenderness in the lumbar region.  No SI joint tenderness.  Shoulder joints, elbow joints, wrist joints, MCPs, PIPs, and DIPs good ROM with no synovitis.  Hip joints, knee joints, ankle joints, MTPs, PIPs, and DIPs good ROM with no synovitis.  No warmth or effusion of knee joints.  No tenderness or swelling of ankle joints.   CDAI Exam: CDAI Score: Not documented Patient  Global Assessment: Not documented; Provider Global Assessment: Not documented Swollen: Not documented; Tender: Not documented Joint Exam   Not documented   There is currently no information documented on the homunculus. Go to the Rheumatology activity and complete the homunculus joint exam.  Investigation: No additional findings.  Imaging: No results found.  Recent Labs: Lab Results  Component Value Date   WBC 10.4 10/06/2017   HGB 14.0 10/06/2017   PLT 372 10/06/2017   NA 139 10/06/2017   K 4.5 10/06/2017   CL 102 10/06/2017   CO2 29 10/06/2017   GLUCOSE 95 10/06/2017   BUN 7 10/06/2017   CREATININE 0.72 10/06/2017   BILITOT 0.3 10/06/2017   ALKPHOS 60 07/05/2017   AST 32 (H) 10/06/2017   ALT 41 (H) 10/06/2017   PROT 7.2 10/06/2017   ALBUMIN 3.9 07/05/2017   CALCIUM 9.7 10/06/2017   GFRAA 121 10/06/2017   QFTBGOLDPLUS NEGATIVE 05/17/2017    Speciality Comments: No specialty comments available.  Procedures:  No procedures performed Allergies: Risperdal [risperidone] and Latex   Assessment / Plan:     Visit Diagnoses: Spondyloarthropathy: She has no SI joint tenderness on exam.  She has noticed significant benefit since switching from Humira to Cosentyx 300 mg sq injections every month.  She has  no synovitis on exam. She denies any achilles tendonitis or plantar fasciitis. She is clinically doing well and will continue on Cosentyx 300 mg sq injections every month.  She was advised to notify us if she develops increased joint pain or joint swelling.  She will follow up in 5 months.   High risk medication use - Cosentyx 300 mg subq once a month.  TB gold negative 05/17/17.  AST/ALT 32/41 on 10/06/17 but trending down.  She will return for lab work in December and every 3 months to monitor for drug toxicity.  Sacroiliitis, not elsewhere classified [facility]): She has no SI joint tenderness on exam today.  She has noticed significant benefit since switching from Humira to  Cosentyx.  Spondylosis of lumbar region without myelopathy or radiculopathy: She continues to have midline spinal tenderness in the lumbar region.  Primary osteoarthritis of both knees: No warmth or effusion noted.  She is good range of motion.  She has chronic pain in bilateral knee joints.  She would like to reapply for Visco gel injections.  X-rays of both knees were obtained today.  Chronic pain of both knees - She has chronic pain in both knee joints.  No warmth or effusion noted.  No mechanical symptoms.  X-rays of both knees were obtained today.  we will reapply for visco gel injections for both knee joints. Plan: XR KNEE 3 VIEW RIGHT, XR KNEE 3 VIEW LEFT   Other medical conditions are listed as follows:   History of fatty infiltration of liver  Essential hypertension  History of gastroesophageal reflux (GERD)  PCOS (polycystic ovarian syndrome)  Hypothyroidism, unspecified type  Depression with anxiety    Orders: Orders Placed This Encounter  Procedures  . XR KNEE 3 VIEW RIGHT  . XR KNEE 3 VIEW LEFT   No orders of the defined types were placed in this encounter.     Follow-Up Instructions: Return for Spondyloarthropathy, Osteoarthritis.   Gearldine Bienenstock, PA-C  Note - This record has been created using Dragon software.  Chart creation errors have been sought, but may not always  have been located. Such creation errors do not reflect on  the standard of medical care.

## 2017-11-14 ENCOUNTER — Telehealth: Payer: Self-pay | Admitting: *Deleted

## 2017-11-14 ENCOUNTER — Ambulatory Visit (INDEPENDENT_AMBULATORY_CARE_PROVIDER_SITE_OTHER): Payer: PPO | Admitting: Physician Assistant

## 2017-11-14 ENCOUNTER — Ambulatory Visit (INDEPENDENT_AMBULATORY_CARE_PROVIDER_SITE_OTHER): Payer: Self-pay

## 2017-11-14 ENCOUNTER — Ambulatory Visit (INDEPENDENT_AMBULATORY_CARE_PROVIDER_SITE_OTHER): Payer: PPO

## 2017-11-14 ENCOUNTER — Encounter: Payer: Self-pay | Admitting: Physician Assistant

## 2017-11-14 VITALS — BP 101/71 | HR 96 | Resp 16 | Ht 63.5 in | Wt 238.0 lb

## 2017-11-14 DIAGNOSIS — M47816 Spondylosis without myelopathy or radiculopathy, lumbar region: Secondary | ICD-10-CM | POA: Diagnosis not present

## 2017-11-14 DIAGNOSIS — E039 Hypothyroidism, unspecified: Secondary | ICD-10-CM | POA: Diagnosis not present

## 2017-11-14 DIAGNOSIS — M170 Bilateral primary osteoarthritis of knee: Secondary | ICD-10-CM | POA: Diagnosis not present

## 2017-11-14 DIAGNOSIS — M25561 Pain in right knee: Secondary | ICD-10-CM | POA: Diagnosis not present

## 2017-11-14 DIAGNOSIS — G8929 Other chronic pain: Secondary | ICD-10-CM | POA: Diagnosis not present

## 2017-11-14 DIAGNOSIS — E282 Polycystic ovarian syndrome: Secondary | ICD-10-CM | POA: Diagnosis not present

## 2017-11-14 DIAGNOSIS — M461 Sacroiliitis, not elsewhere classified: Secondary | ICD-10-CM | POA: Diagnosis not present

## 2017-11-14 DIAGNOSIS — I10 Essential (primary) hypertension: Secondary | ICD-10-CM

## 2017-11-14 DIAGNOSIS — Z79899 Other long term (current) drug therapy: Secondary | ICD-10-CM | POA: Diagnosis not present

## 2017-11-14 DIAGNOSIS — M25562 Pain in left knee: Secondary | ICD-10-CM

## 2017-11-14 DIAGNOSIS — Z8719 Personal history of other diseases of the digestive system: Secondary | ICD-10-CM | POA: Diagnosis not present

## 2017-11-14 DIAGNOSIS — M47819 Spondylosis without myelopathy or radiculopathy, site unspecified: Secondary | ICD-10-CM | POA: Diagnosis not present

## 2017-11-14 DIAGNOSIS — F418 Other specified anxiety disorders: Secondary | ICD-10-CM

## 2017-11-14 NOTE — Telephone Encounter (Signed)
Faxed renewal application to [employer] for 2020. Awaiting response.  [phone] Phone# [phone]  3:51 PM Dorthula Nettles, CPhT

## 2017-11-14 NOTE — Telephone Encounter (Signed)
Please apply for visco bil knees with Sherron Ales. Thank you. Patient's insurance has changed since last injection in 05/2017.

## 2017-11-14 NOTE — Patient Instructions (Signed)
Standing Labs We placed an order today for your standing lab work.    Please come back and get your standing labs in December and every 3 months   We have open lab Monday through Friday from 8:30-11:30 AM and 1:30-4:00 PM  at the office of Dr. Shaili Deveshwar.   You may experience shorter wait times on Monday and Friday afternoons. The office is located at [address], [address], [place], [place] [address] No appointment is necessary.   Labs are drawn by [facility].  You may receive a bill from [facility] for your lab work. If you have any questions regarding directions or hours of operation,  please call [phone].   Just as a reminder please drink plenty of water prior to coming for your lab work. Thanks!   

## 2017-11-15 NOTE — Telephone Encounter (Signed)
Noted  

## 2017-11-27 ENCOUNTER — Telehealth (INDEPENDENT_AMBULATORY_CARE_PROVIDER_SITE_OTHER): Payer: Self-pay

## 2017-11-27 NOTE — Telephone Encounter (Signed)
Submitted VOB for Orthovisc series, bilateral knee. 

## 2017-12-01 ENCOUNTER — Telehealth (INDEPENDENT_AMBULATORY_CARE_PROVIDER_SITE_OTHER): Payer: Self-pay

## 2017-12-01 NOTE — Telephone Encounter (Signed)
Received VM from patient concerning status of gel injection. Called and left a VM advising patient that I am currently waiting for VOB for Orthovisc, bilateral knee.  Once received, I will give her a call back.

## 2017-12-04 ENCOUNTER — Other Ambulatory Visit: Payer: Self-pay | Admitting: Rheumatology

## 2017-12-04 NOTE — Telephone Encounter (Signed)
Last Visit: 11/14/17 Next Visit: 04/16/18  Okay to refill per Dr. Corliss Skains

## 2017-12-07 ENCOUNTER — Telehealth (INDEPENDENT_AMBULATORY_CARE_PROVIDER_SITE_OTHER): Payer: Self-pay

## 2017-12-07 NOTE — Telephone Encounter (Signed)
Please schedule patient an appointment with Dr. Corliss Skains or Ladona Ridgel.  Thank you.  Patient is approved for Orthovisc series, bilateral knee. Buy & Bill Patient will be responsible for 20% OOP. No Co-pay No PA required

## 2017-12-11 NOTE — Telephone Encounter (Signed)
LMOM for patient to call and schedule Orthovisc injections 

## 2018-01-02 ENCOUNTER — Other Ambulatory Visit: Payer: Self-pay

## 2018-01-02 ENCOUNTER — Encounter: Payer: PPO | Attending: General Surgery | Admitting: Skilled Nursing Facility1

## 2018-01-02 DIAGNOSIS — Z713 Dietary counseling and surveillance: Secondary | ICD-10-CM | POA: Insufficient documentation

## 2018-01-02 DIAGNOSIS — E669 Obesity, unspecified: Secondary | ICD-10-CM

## 2018-01-02 DIAGNOSIS — Z6841 Body Mass Index (BMI) 40.0 and over, adult: Secondary | ICD-10-CM | POA: Diagnosis not present

## 2018-01-02 DIAGNOSIS — E1165 Type 2 diabetes mellitus with hyperglycemia: Secondary | ICD-10-CM | POA: Diagnosis not present

## 2018-01-02 DIAGNOSIS — Z79899 Other long term (current) drug therapy: Secondary | ICD-10-CM | POA: Diagnosis not present

## 2018-01-02 DIAGNOSIS — E782 Mixed hyperlipidemia: Secondary | ICD-10-CM | POA: Diagnosis not present

## 2018-01-02 DIAGNOSIS — E039 Hypothyroidism, unspecified: Secondary | ICD-10-CM | POA: Diagnosis not present

## 2018-01-03 LAB — COMPLETE METABOLIC PANEL WITH GFR
AG RATIO: 1.3 (calc) (ref 1.0–2.5)
ALBUMIN MSPROF: 4.1 g/dL (ref 3.6–5.1)
ALT: 17 U/L (ref 6–29)
AST: 15 U/L (ref 10–30)
Alkaline phosphatase (APISO): 98 U/L (ref 33–115)
BUN: 16 mg/dL (ref 7–25)
CO2: 23 mmol/L (ref 20–32)
CREATININE: 0.7 mg/dL (ref 0.50–1.10)
Calcium: 9.4 mg/dL (ref 8.6–10.2)
Chloride: 103 mmol/L (ref 98–110)
GFR, EST NON AFRICAN AMERICAN: 108 mL/min/{1.73_m2} (ref >=60)
GFR, Est African American: 126 mL/min/{1.73_m2} (ref >=60)
Globulin: 3.2 g/dL (calc) (ref 1.9–3.7)
Glucose, Bld: 173 mg/dL — ABNORMAL HIGH (ref 65–99)
Potassium: 4.6 mmol/L (ref 3.5–5.3)
SODIUM: 135 mmol/L (ref 135–146)
Total Bilirubin: 0.3 mg/dL (ref 0.2–1.2)
Total Protein: 7.3 g/dL (ref 6.1–8.1)

## 2018-01-03 LAB — CBC WITH DIFFERENTIAL/PLATELET
Basophils Absolute: 64 cells/uL (ref 0–200)
Basophils Relative: 0.5 %
EOS ABS: 205 {cells}/uL (ref 15–500)
Eosinophils Relative: 1.6 %
HCT: 42.3 % (ref 35.0–45.0)
Hemoglobin: 14.1 g/dL (ref 11.7–15.5)
Lymphs Abs: 3430 cells/uL (ref 850–3900)
MCH: 29.4 pg (ref 27.0–33.0)
MCHC: 33.3 g/dL (ref 32.0–36.0)
MCV: 88.1 fL (ref 80.0–100.0)
MONOS PCT: 4.5 %
MPV: 10.9 fL (ref 7.5–12.5)
NEUTROS PCT: 66.6 %
Neutro Abs: 8525 cells/uL — ABNORMAL HIGH (ref 1500–7800)
PLATELETS: 395 10*3/uL (ref 140–400)
RBC: 4.8 10*6/uL (ref 3.80–5.10)
RDW: 13.5 % (ref 11.0–15.0)
TOTAL LYMPHOCYTE: 26.8 %
WBC: 12.8 10*3/uL — AB (ref 3.8–10.8)
WBCMIX: 576 {cells}/uL (ref 200–950)

## 2018-01-03 NOTE — Progress Notes (Signed)
Labs are stable. Glu is elevated.

## 2018-01-04 ENCOUNTER — Encounter: Payer: Self-pay | Admitting: Skilled Nursing Facility1

## 2018-01-04 NOTE — Progress Notes (Signed)
Follow-up visit:  Post-Operative RYGB Surgery  Medical Nutrition Therapy:  Appt start time: 6:00pm end time:  7:00pm  Primary concerns today: Post-operative Bariatric Surgery Nutrition Management 6 Month Post-Op Class  Surgery date: 07/04/2017 Surgery type: RYGB Start weight at [facility]: 310 Weight today: 227.2  TANITA  BODY COMP RESULTS  07/19/2017 08/30/2017 10/30/2017 01/02/2018   BMI (kg/m^2) 50.1 47.8 41.8 39.6   Fat Mass (lbs) 161.0 146 123.2 107.4   Fat Free Mass (lbs) 126.4 123.8 116.4 119.8   Total Body Water (lbs) 94.6 92 85.6 87.4     Information Reviewed/ Discussed During Appointment: -Review of composition scale numbers -Fluid requirements (64-100 ounces) -Protein requirements (60-80g) -Strategies for tolerating diet -Advancement of diet to include Starchy vegetables -Barriers to inclusion of new foods -Inclusion of appropriate multivitamin and calcium supplements  -Exercise recommendations   Fluid intake: adequate   Medications: see list Supplementation: appropriate   Using straws: no Drinking while eating: no Having you been chewing well:no Chewing/swallowing difficulties: no Changes in vision: no Changes to mood/headaches: no Hair loss/Cahnges to skin/Changes to nails: no Any difficulty focusing or concentrating: no Sweating: no Dizziness/Lightheaded:  Palpitations: on  Carbonated beverages: no N/V/D/C/GAS: no Abdominal Pain: no  Recent physical activity:  ADL's  Progress Towards Goal(s):  In progress.  Handouts given during visit include:  Phase V diet Progression   Goals Sheet  The Benefits of Exercise are endless.....  Support Group Topics  Pt Chosen Goals:  I will walk/swim 30 minutes 5 days a week by 02/22/2018 I will not drink with 3 meals 7 days a week by 01/24/2018  Teaching Method Utilized:  Visual Auditory Hands on   Demonstrated degree of understanding via:  Teach Back   Monitoring/Evaluation:  Dietary intake, exercise,  and body weight. Follow up in 3 months for 9 month post-op visit.

## 2018-01-08 DIAGNOSIS — Z9884 Bariatric surgery status: Secondary | ICD-10-CM | POA: Insufficient documentation

## 2018-01-08 DIAGNOSIS — E039 Hypothyroidism, unspecified: Secondary | ICD-10-CM | POA: Diagnosis not present

## 2018-01-08 DIAGNOSIS — E782 Mixed hyperlipidemia: Secondary | ICD-10-CM | POA: Diagnosis not present

## 2018-01-08 DIAGNOSIS — E282 Polycystic ovarian syndrome: Secondary | ICD-10-CM | POA: Diagnosis not present

## 2018-01-08 DIAGNOSIS — E1165 Type 2 diabetes mellitus with hyperglycemia: Secondary | ICD-10-CM | POA: Diagnosis not present

## 2018-01-08 DIAGNOSIS — E559 Vitamin D deficiency, unspecified: Secondary | ICD-10-CM | POA: Diagnosis not present

## 2018-01-08 DIAGNOSIS — Z794 Long term (current) use of insulin: Secondary | ICD-10-CM | POA: Diagnosis not present

## 2018-01-12 ENCOUNTER — Ambulatory Visit: Payer: PPO | Admitting: Physician Assistant

## 2018-01-12 DIAGNOSIS — M170 Bilateral primary osteoarthritis of knee: Secondary | ICD-10-CM

## 2018-01-12 MED ORDER — HYALURONAN 30 MG/2ML IX SOSY
30.0000 mg | PREFILLED_SYRINGE | INTRA_ARTICULAR | Status: AC | PRN
  Administered 2018-01-12: 30 mg via INTRA_ARTICULAR

## 2018-01-12 MED ORDER — LIDOCAINE HCL 1 % IJ SOLN
1.5000 mL | INTRAMUSCULAR | Status: AC | PRN
  Administered 2018-01-12: 1.5 mL

## 2018-01-12 MED ORDER — LIDOCAINE HCL 1 % IJ SOLN
1.5000 mL | INTRAMUSCULAR | Status: AC | PRN
Start: 2018-01-12 — End: 2018-01-12
  Administered 2018-01-12: 1.5 mL

## 2018-01-12 NOTE — Progress Notes (Signed)
   Procedure Note  Patient: TRISTON LISANTI             Date of Birth: Jun 13, 1975           MRN: [id number]             Visit Date: 01/12/2018  Procedures: Visit Diagnoses: Primary osteoarthritis of both knees orthovisc #1 bilateral B/B Large Joint Inj: bilateral knee on 01/12/2018 10:01 AM Indications: pain Details: 25 G 1.5 in needle, medial approach  Arthrogram: No  Medications (Right): 30 mg Hyaluronan 30 MG/2ML; 1.5 mL lidocaine 1 % Aspirate (Right): 0 mL Medications (Left): 30 mg Hyaluronan 30 MG/2ML; 1.5 mL lidocaine 1 % Aspirate (Left): 0 mL Outcome: tolerated well, no immediate complications Procedure, treatment alternatives, risks and benefits explained, specific risks discussed. Consent was given by the patient. Immediately prior to procedure a time out was called to verify the correct patient, procedure, equipment, support staff and site/side marked as required. Patient was prepped and draped in the usual sterile fashion.    Patient tolerated the procedure well.  Sherron Ales, PA-C

## 2018-01-19 ENCOUNTER — Ambulatory Visit (INDEPENDENT_AMBULATORY_CARE_PROVIDER_SITE_OTHER): Payer: PPO | Admitting: Physician Assistant

## 2018-01-19 DIAGNOSIS — M170 Bilateral primary osteoarthritis of knee: Secondary | ICD-10-CM | POA: Diagnosis not present

## 2018-01-19 MED ORDER — LIDOCAINE HCL 1 % IJ SOLN
1.5000 mL | INTRAMUSCULAR | Status: AC | PRN
  Administered 2018-01-19: 1.5 mL

## 2018-01-19 MED ORDER — HYALURONAN 30 MG/2ML IX SOSY
30.0000 mg | PREFILLED_SYRINGE | INTRA_ARTICULAR | Status: AC | PRN
  Administered 2018-01-19: 30 mg via INTRA_ARTICULAR

## 2018-01-19 NOTE — Progress Notes (Signed)
   Procedure Note  Patient: QUINN BARTLING             Date of Birth: 1975-01-26           MRN: [id number]             Visit Date: 01/19/2018  Procedures: Visit Diagnoses: Primary osteoarthritis of both knees orthovisc #2 bilateral B/B Large Joint Inj: bilateral knee on 01/19/2018 9:00 AM Indications: pain Details: 25 G 1.5 in needle, medial approach  Arthrogram: No  Medications (Right): 30 mg Hyaluronan 30 MG/2ML; 1.5 mL lidocaine 1 % Aspirate (Right): 0 mL Medications (Left): 30 mg Hyaluronan 30 MG/2ML; 1.5 mL lidocaine 1 % Aspirate (Left): 0 mL Outcome: tolerated well, no immediate complications Procedure, treatment alternatives, risks and benefits explained, specific risks discussed. Consent was given by the patient. Immediately prior to procedure a time out was called to verify the correct patient, procedure, equipment, support staff and site/side marked as required. Patient was prepped and draped in the usual sterile fashion.     Patient tolerated the procedure well.  Sherron Ales, PA-C

## 2018-01-26 ENCOUNTER — Ambulatory Visit (INDEPENDENT_AMBULATORY_CARE_PROVIDER_SITE_OTHER): Payer: PPO | Admitting: Physician Assistant

## 2018-01-26 DIAGNOSIS — M170 Bilateral primary osteoarthritis of knee: Secondary | ICD-10-CM | POA: Diagnosis not present

## 2018-01-26 MED ORDER — HYALURONAN 30 MG/2ML IX SOSY
30.0000 mg | PREFILLED_SYRINGE | INTRA_ARTICULAR | Status: AC | PRN
  Administered 2018-01-26: 30 mg via INTRA_ARTICULAR

## 2018-01-26 MED ORDER — LIDOCAINE HCL 1 % IJ SOLN
1.5000 mL | INTRAMUSCULAR | Status: AC | PRN
  Administered 2018-01-26: 1.5 mL

## 2018-01-26 NOTE — Progress Notes (Signed)
   Procedure Note  Patient: Allison Atkinson             Date of Birth: 31-Dec-1975           MRN: [id number]             Visit Date: 01/26/2018  Procedures: Visit Diagnoses: Primary osteoarthritis of both knees - Plan: Large Joint Inj: bilateral knee Orthovisc #3 bilateral knee joint injections  Large Joint Inj: bilateral knee on 01/26/2018 8:12 AM Indications: pain Details: 25 G 1.5 in needle, medial approach  Arthrogram: No  Medications (Right): 1.5 mL lidocaine 1 %; 30 mg Hyaluronan 30 MG/2ML Aspirate (Right): 0 mL Medications (Left): 1.5 mL lidocaine 1 %; 30 mg Hyaluronan 30 MG/2ML Aspirate (Left): 0 mL Outcome: tolerated well, no immediate complications Procedure, treatment alternatives, risks and benefits explained, specific risks discussed. Consent was given by the patient. Immediately prior to procedure a time out was called to verify the correct patient, procedure, equipment, support staff and site/side marked as required. Patient was prepped and draped in the usual sterile fashion.      Patient tolerated the procedure well.  Sherron Ales, PA-C

## 2018-01-30 ENCOUNTER — Telehealth: Payer: Self-pay | Admitting: Rheumatology

## 2018-01-30 MED ORDER — SECUKINUMAB (300 MG DOSE) 150 MG/ML ~~LOC~~ SOAJ: 300.0000 mg | SUBCUTANEOUS | 0 refills | Status: DC

## 2018-01-30 NOTE — Telephone Encounter (Signed)
Last Visit: 11/14/17 Next Visit: 04/16/18 Labs: 01/02/18  Labs are stable. Glu is elevated.  Tb Gold: 05/12/17 Neg   Okay to refill per Dr. Corliss Skains  Faxed to [employer].

## 2018-01-30 NOTE — Telephone Encounter (Signed)
Patient called requesting prescription refill of Cosentyx to be sent to [employer] patient assistance.   

## 2018-02-02 ENCOUNTER — Telehealth: Payer: Self-pay

## 2018-02-02 ENCOUNTER — Telehealth: Payer: Self-pay | Admitting: Rheumatology

## 2018-02-02 NOTE — Telephone Encounter (Signed)
Patient left a message stating [employer] is 2weeks behind on their faxes. [employer] requesting rx for Cosentyx to be escribed to them. Patient has escribe number if you need that.

## 2018-02-02 NOTE — Telephone Encounter (Signed)
Patient left a voicemail stating [employer] has not received cosentyx prescription. Please advise.

## 2018-02-02 NOTE — Telephone Encounter (Signed)
Patient advised prescription was faxed on 01/31/18 and re-faxed today. Patient advised to reach back out to [employer].

## 2018-02-05 MED ORDER — SECUKINUMAB (300 MG DOSE) 150 MG/ML ~~LOC~~ SOAJ: 300.0000 mg | SUBCUTANEOUS | 0 refills | Status: DC

## 2018-02-05 NOTE — Telephone Encounter (Signed)
Last Visit: 11/14/17 Next Visit: 04/16/18 Labs: 01/02/18  Labs are stable. Glu is elevated.  Tb Gold: 05/12/17 Neg   Okay to refill per Dr. Corliss Skains   Patient advised prescription sent.

## 2018-02-08 DIAGNOSIS — G4733 Obstructive sleep apnea (adult) (pediatric): Secondary | ICD-10-CM | POA: Diagnosis not present

## 2018-02-09 ENCOUNTER — Telehealth: Payer: Self-pay | Admitting: Rheumatology

## 2018-02-09 NOTE — Telephone Encounter (Signed)
Patient called stating [employer] told her that they are 2-3 weeks behind on refilling prescriptions from faxes and her Cosentyx prescription needs to be Escribed to:  [facility] [employer] [facility] in [place].  Patient states she is due to take her injection and is asking if she can pick up a sample from the office.

## 2018-02-09 NOTE — Telephone Encounter (Signed)
Patient came in for sample.  Medication Samples have been provided to the patient.  Drug name: Cosentyx       Strength: 300mg         Qty: 1 box  LOT: [id number]  Exp.Date: 02/2019  Dosing instructions: Inject 300mg  into skin every 4 weeks  The patient has been instructed regarding the correct time, dose, and frequency of taking this medication, including desired effects and most common side effects.   Dorthula Nettles 3:24 PM 02/09/2018

## 2018-02-16 ENCOUNTER — Telehealth: Payer: Self-pay | Admitting: *Deleted

## 2018-02-16 NOTE — Telephone Encounter (Signed)
Spoke with patient and advised her that we are unable to fill out the paper work she has sent to [place]. Advised  Her she will need to have a FCE and then have PCP fill out paperwork. Patient verbalized understanding and asked to have paperwork shredded.

## 2018-03-05 ENCOUNTER — Telehealth: Payer: Self-pay | Admitting: Rheumatology

## 2018-03-05 NOTE — Telephone Encounter (Signed)
Spoke with [employer] and they have a prescription on file. They will stat the prescription to the patient and she should have the prescription by 03/09/18. Spoke with the patient to advise.

## 2018-03-05 NOTE — Telephone Encounter (Signed)
Patient called stating she spoke with a representative from [employer] patient assistance program and they still have not received a prescription for Cosentyx.  Patient states they can receive a verbal order at [phone]

## 2018-03-21 NOTE — Telephone Encounter (Signed)
Received fax from [employer], patient's renewal application has been APPROVED. Coverage dates are from 03/21/2018 to 01/24/2019.   Will send documents to [facility].   Phone# [phone] Fax# [phone]

## 2018-04-02 NOTE — Progress Notes (Deleted)
Office Visit Note  Patient: Allison Atkinson             Date of Birth: 30-Jun-1975           MRN: [id number]             PCP: Carylon Perches, MD Referring: Carylon Perches, MD Visit Date: 04/16/2018 Occupation: @GUAROCC @  Subjective:  No chief complaint on file.  Cosentyx 300 mg every 28 days.  Last TB gold negative on 05/17/2017.  Most recent CBC/CMP within normal limits on 04/03/2018 and will monitor every 3 months.  Standing orders are in place.  She received her flu shot in September and previously Pneumovax 23.  Recommend Prevnar 13 as indicated.  History of Present Illness: Allison Atkinson is a 43 y.o. female ***   Activities of Daily Living:  Patient reports morning stiffness for *** {minute/hour:19697}.   Patient {ACTIONS;DENIES/REPORTS:21021675::"Denies"} nocturnal pain.  Difficulty dressing/grooming: {ACTIONS;DENIES/REPORTS:21021675::"Denies"} Difficulty climbing stairs: {ACTIONS;DENIES/REPORTS:21021675::"Denies"} Difficulty getting out of chair: {ACTIONS;DENIES/REPORTS:21021675::"Denies"} Difficulty using hands for taps, buttons, cutlery, and/or writing: {ACTIONS;DENIES/REPORTS:21021675::"Denies"}  No Rheumatology ROS completed.   PMFS History:  Patient Active Problem List   Diagnosis Date Noted  . Morbid obesity (HCC) 07/04/2017  . High risk medication use 03/30/2016  . Acute midline low back pain without sciatica 03/30/2016  . Primary osteoarthritis of both knees 01/12/2016  . Spondylosis of lumbar region without myelopathy or radiculopathy 01/12/2016  . Sacroiliitis, not elsewhere classified (HCC) 01/12/2016  . Chronic diarrhea 01/12/2016  . History of gastroesophageal reflux (GERD) 01/12/2016  . History of fatty infiltration of liver 01/12/2016  . Hypertension 05/09/2013  . Palpitations 04/11/2013  . Spondyloarthropathy (HCC) 10/24/2009  . PCOS (polycystic ovarian syndrome) 01/25/2003  . Depression with anxiety 01/25/2003  . GERD (gastroesophageal reflux disease)  01/25/2003  . Hypothyroidism 01/24/2001    Past Medical History:  Diagnosis Date  . Anxiety   . Bipolar disorder (HCC)   . Diabetes mellitus without complication (HCC) 05/2017  . Fatty liver disease, nonalcoholic   . History of cholecystectomy   . Hypertension   . Polycystic ovarian disease   . RA (rheumatoid arthritis) (HCC)   . Thyroid disease    hypothyroid    Family History  Problem Relation Age of Onset  . Hypertension Mother   . Alcohol abuse Father    Past Surgical History:  Procedure Laterality Date  . CHOLECYSTECTOMY  1999  . COLONOSCOPY W/ BIOPSIES  2010  . ESOPHAGOGASTRODUODENOSCOPY  2010  . 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 KNEE ARTHROPLASTY    . KNEE ARTHROSCOPY Right    Social History   Social History Narrative  . Not on file   Immunization History  Administered Date(s) Administered  . Influenza,inj,Quad PF,6+ Mos 10/19/2017  . Influenza-Unspecified 11/13/2012  . Pneumococcal Polysaccharide-23 10/24/2008     Objective: Vital Signs: There were no vitals taken for this visit.   Physical Exam   Musculoskeletal Exam: ***  CDAI Exam: CDAI Score: Not documented Patient Global Assessment: Not documented; Provider Global Assessment: Not documented Swollen: Not documented; Tender: Not documented Joint Exam   Not documented   There is currently no information documented on the homunculus. Go to the Rheumatology activity and complete the homunculus joint exam.  Investigation: No additional findings.  Imaging: No results found.  Recent Labs: Lab Results  Component Value Date  WBC 12.8 (H) 01/02/2018   HGB 14.1 01/02/2018   PLT 395 01/02/2018   NA 135 01/02/2018   K 4.6 01/02/2018   CL 103 01/02/2018   CO2 23 01/02/2018   GLUCOSE 173 (H) 01/02/2018   BUN 16 01/02/2018    CREATININE 0.70 01/02/2018   BILITOT 0.3 01/02/2018   ALKPHOS 60 07/05/2017   AST 15 01/02/2018   ALT 17 01/02/2018   PROT 7.3 01/02/2018   ALBUMIN 3.9 07/05/2017   CALCIUM 9.4 01/02/2018   GFRAA 126 01/02/2018   QFTBGOLDPLUS NEGATIVE 05/17/2017    Speciality Comments: No specialty comments available.  Procedures:  No procedures performed Allergies: Risperdal [risperidone] and Latex   Assessment / Plan:     Visit Diagnoses: No diagnosis found.   Orders: No orders of the defined types were placed in this encounter.  No orders of the defined types were placed in this encounter.   Face-to-face time spent with patient was *** minutes. Greater than 50% of time was spent in counseling and coordination of care.  Follow-Up Instructions: No follow-ups on file.   Ellen Henri, CMA  Note - This record has been created using Animal nutritionist.  Chart creation errors have been sought, but may not always  have been located. Such creation errors do not reflect on  the standard of medical care.

## 2018-04-03 ENCOUNTER — Other Ambulatory Visit: Payer: Self-pay

## 2018-04-03 DIAGNOSIS — Z79899 Other long term (current) drug therapy: Secondary | ICD-10-CM

## 2018-04-03 DIAGNOSIS — E1165 Type 2 diabetes mellitus with hyperglycemia: Secondary | ICD-10-CM | POA: Diagnosis not present

## 2018-04-03 DIAGNOSIS — E039 Hypothyroidism, unspecified: Secondary | ICD-10-CM | POA: Diagnosis not present

## 2018-04-03 DIAGNOSIS — E782 Mixed hyperlipidemia: Secondary | ICD-10-CM | POA: Diagnosis not present

## 2018-04-03 DIAGNOSIS — E559 Vitamin D deficiency, unspecified: Secondary | ICD-10-CM | POA: Diagnosis not present

## 2018-04-03 LAB — COMPLETE METABOLIC PANEL WITH GFR
AG RATIO: 1.5 (calc) (ref 1.0–2.5)
ALBUMIN MSPROF: 4.1 g/dL (ref 3.6–5.1)
ALT: 21 U/L (ref 6–29)
AST: 16 U/L (ref 10–30)
Alkaline phosphatase (APISO): 94 U/L (ref 31–125)
BILIRUBIN TOTAL: 0.5 mg/dL (ref 0.2–1.2)
BUN: 8 mg/dL (ref 7–25)
CHLORIDE: 104 mmol/L (ref 98–110)
CO2: 24 mmol/L (ref 20–32)
Calcium: 9.5 mg/dL (ref 8.6–10.2)
Creat: 0.69 mg/dL (ref 0.50–1.10)
GFR, EST AFRICAN AMERICAN: 126 mL/min/{1.73_m2} (ref >=60)
GFR, Est Non African American: 109 mL/min/{1.73_m2} (ref >=60)
GLOBULIN: 2.8 g/dL (ref 1.9–3.7)
Glucose, Bld: 118 mg/dL — ABNORMAL HIGH (ref 65–99)
POTASSIUM: 4.5 mmol/L (ref 3.5–5.3)
SODIUM: 137 mmol/L (ref 135–146)
Total Protein: 6.9 g/dL (ref 6.1–8.1)

## 2018-04-03 LAB — CBC WITH DIFFERENTIAL/PLATELET
Absolute Monocytes: 490 cells/uL (ref 200–950)
BASOS ABS: 51 {cells}/uL (ref 0–200)
Basophils Relative: 0.5 %
EOS PCT: 1.9 %
Eosinophils Absolute: 194 cells/uL (ref 15–500)
HCT: 41.9 % (ref 35.0–45.0)
Hemoglobin: 13.9 g/dL (ref 11.7–15.5)
Lymphs Abs: 3743 cells/uL (ref 850–3900)
MCH: 29.4 pg (ref 27.0–33.0)
MCHC: 33.2 g/dL (ref 32.0–36.0)
MCV: 88.8 fL (ref 80.0–100.0)
MONOS PCT: 4.8 %
MPV: 10.7 fL (ref 7.5–12.5)
NEUTROS PCT: 56.1 %
Neutro Abs: 5722 cells/uL (ref 1500–7800)
PLATELETS: 352 10*3/uL (ref 140–400)
RBC: 4.72 10*6/uL (ref 3.80–5.10)
RDW: 13.1 % (ref 11.0–15.0)
TOTAL LYMPHOCYTE: 36.7 %
WBC: 10.2 10*3/uL (ref 3.8–10.8)

## 2018-04-04 ENCOUNTER — Ambulatory Visit: Payer: Medicare Other | Admitting: Dietician

## 2018-04-09 ENCOUNTER — Other Ambulatory Visit: Payer: Self-pay

## 2018-04-09 ENCOUNTER — Encounter: Payer: Self-pay | Admitting: Dietician

## 2018-04-09 ENCOUNTER — Encounter: Payer: PPO | Attending: General Surgery | Admitting: Dietician

## 2018-04-09 VITALS — Wt 209.8 lb

## 2018-04-09 DIAGNOSIS — Z6841 Body Mass Index (BMI) 40.0 and over, adult: Secondary | ICD-10-CM | POA: Insufficient documentation

## 2018-04-09 DIAGNOSIS — Z713 Dietary counseling and surveillance: Secondary | ICD-10-CM | POA: Insufficient documentation

## 2018-04-09 DIAGNOSIS — E669 Obesity, unspecified: Secondary | ICD-10-CM

## 2018-04-09 NOTE — Progress Notes (Signed)
Bariatric Follow-Up Visit Post-Operative RYGB Surgery  Medical Nutrition Therapy Appt Start Time: 4:30pm End Time: 4:50pm  Post-operative Bariatric Surgery Nutrition Management  9 Months Post-Op RYGB Surgery   Surgery date: 07/04/2017 Surgery type: RYGB Start weight at [facility]: 310 Weight today: 209.8 Weight change: -17.4 lbs since previous visit on 01/02/2018 Total weight lost: -100.2 lbs since start date 08/08/2016  [place] Body Composition Results   07/19/2017 08/30/2017 10/30/2017 01/02/2018   BMI (kg/m^2) 50.1 47.8 41.8 39.6   Fat Mass (lbs) 161.0 146 123.2 107.4   Fat Free Mass (lbs) 126.4 123.8 116.4 119.8   Total Body Water (lbs) 94.6 92 85.6 87.4   Body Composition Scale 04/09/2018 BMI: 36.3 Total Body Fat: 40.5 % Visceral Fat: 11 Fat-Free Mass:  59.4 %  Total Body Water: 44.2  %  Muscle-Mass: 31.5 lbs Body Fat Displacement:  Torso: 52.6  lbs  Left Leg: 10.5 lbs  Right Leg:  lbs  Left Arm: 5.2 lbs  Right Arm:  Lbs   24 Hr Dietary Recall B: greek yogurt  Sn: sometimes protein shake  L: chili beans  Sn: greek yogurt  D: stuffed green peppers (hamburger + potato stuffing)  Sn: sometimes protein shake   Fluid intake: 8 bottles water/day  (16.9oz each)  Medications: see list Supplementation: bariatric MVI and calcium 2 hours apart   Using straws: no Drinking while eating: yes (sips, tolerates fine) Chewing/swallowing difficulties: no Changes in vision: no Changes to mood/headaches: no Hair loss/Changes to skin/Changes to nails: hair loss/ no/ no Any difficulty focusing or concentrating: no Sweating: no Dizziness/Lightheaded: no Palpitations: no  Carbonated beverages: no N/V/D/C/Gas: no Abdominal Pain: no  Recent physical activity: water aerobics 1 hr per day 3 days/week   Pt Chosen Goals:  I will walk/swim 30 minutes 5 days a week by 02/22/2018   *Pt is meeting this! Reaching total of 180 minutes/week with water aerobics, hopes to add in more   walking.   I will not drink with 3 meals 7 days a week by 01/24/2018 Progress Towards Goal(s):  Some progress.  Handouts given during visit include:  Phase VI diet Progression   Teaching Method Utilized:  Visual Auditory Hands on  Demonstrated degree of understanding via:  Teach Back   Monitoring/Evaluation:  Dietary intake, exercise, and body weight. Follow up in 3 months for 12 month post-op class.

## 2018-04-09 NOTE — Patient Instructions (Addendum)
   Continue prioritizing protein foods and vegetables, and now try adding in fruits. This will help cover your nutrition needs and ensure you are getting adequate protein as you are staying physically active.   Continue meeting your fluid goal and taking vitamins/minerals daily. Great job!!  Keep up the good work of staying active, you are meeting your 150 minutes/ week goal.   See you at 12 Month Post Op Class!

## 2018-04-16 ENCOUNTER — Ambulatory Visit: Payer: PPO | Admitting: Physician Assistant

## 2018-04-18 ENCOUNTER — Other Ambulatory Visit: Payer: Self-pay | Admitting: Rheumatology

## 2018-04-18 NOTE — Telephone Encounter (Signed)
Last Visit: 11/14/2017 Next Visit: 05/17/2018  Okay to refill per Dr. Corliss Skains.

## 2018-05-11 NOTE — Progress Notes (Signed)
Virtual Visit via Telephone Note   I connected with Allison Atkinson on 05/11/18 at  2:00 PM EDT by telephone and verified that I am speaking with the correct person using two identifiers.   I discussed the limitations, risks, security and privacy concerns of performing an evaluation and management service by telephone and the availability of in person appointments. I also discussed with the patient that there may be a patient responsible charge related to this service. The patient expressed understanding and agreed to proceed.  This service was conducted via virtual visit. The patient was located at home. I was located in my office.  Consent was obtained prior to the virtual visit and is aware of possible charges through their insurance for this visit.  The patient is an established patient.  Dr. Corliss Skains, MD conducted the virtual visit and Sherron Ales, PA-C acted as scribed during the service.  Office staff helped with scheduling follow up visits after the service was conducted.    CC: Right knee joint pain   History of Present Illness: Patient is a 43 year old female with a past medical history of spondyloarthropathy and osteoarthritis.  She is on Cosentyx 300 mg sq once monthly.  She experiences intermittent right knee joint pain.  She denies any joint swelling.  She has occasional right knee joint pain at night.  She is not having any lower back pain at this time.  She has no SI joint pain at this time.  She denies any joint swelling at this time.  Review of Systems  Constitutional: Positive for malaise/fatigue. Negative for fever.  HENT:       +dry mouth  Eyes: Negative for photophobia, pain, discharge and redness.  Respiratory: Negative for cough, shortness of breath and wheezing.   Cardiovascular: Negative for chest pain and palpitations.  Gastrointestinal: Negative for blood in stool, constipation and diarrhea.  Genitourinary: Negative for dysuria.  Musculoskeletal: Positive for  joint pain. Negative for back pain, myalgias and neck pain.  Skin: Negative for rash.  Neurological: Negative for dizziness and headaches.  Psychiatric/Behavioral: Positive for depression. The patient is nervous/anxious. The patient does not have insomnia.    Observations/Objective: Physical Exam  Constitutional: She is oriented to person, place, and time.  Neurological: She is alert and oriented to person, place, and time.  Psychiatric: Mood, memory, affect and judgment normal.   Patient reports morning stiffness for 1 hour.   Patient reports nocturnal pain.  Difficulty dressing/grooming: Denies Difficulty climbing stairs: Reports Difficulty getting out of chair: Denies Difficulty using hands for taps, buttons, cutlery, and/or writing: Denies   Assessment and Plan: Spondyloarthropathy: She has no SI joint pain or lower back pain at this time.  She experiences 1 hour of morning stiffness.  She has not had any recent flares.  She has intermittent right knee joint pain but no joint swelling.  She has no other joint pain or joint swelling at this time.  She has noticed significant benefit since switching from Humira to Cosentyx 300 mg sq injections every month.  She will continue on this current treatment regimen.  She was advised to notify us if she develops any signs or symptoms of a flare.  She will follow up in 3 months.  High risk medication use - Cosentyx 300 mg subq once a month injections.  TB gold negative 05/17/17. Future order for TB gold was placed today.  CBC and CMP were drawn on 04/03/18.  Standing orders will be placed today.  She is due to update CBC and CMP in June and every 3 months.  She was advised to hold Cosentyx if she develops an infection and to resume once the infection has cleared.  Sacroiliitis, not elsewhere classified [facility](HCC): She has no SI joint pain at this time.  She is on Cosentyx 300 mg sq injections once monthly.   Spondylosis of lumbar region without  myelopathy or radiculopathy: She has no lower back pain at this time.  She experiences 1 hour of morning stiffness.   Primary osteoarthritis of both knees: She had x-rays of both knee joints on 11/14/17 that revealed bilateral moderate osteoarthritis and  moderate chondromalacia patella. She had orthovisc bilateral knee joint injections on 01/12/18, 01/19/18, and 01/26/18.  She continues to have intermittent right knee joint pain.  She has occasional nocturnal right knee joint pian. She has no joint swelling.  She was encouraged to continue to use voltaren gel topically.  She can use ACE bandage or a brace when walking for support. If her right knee pain persists we will perform a cortisone injection in the future.   Follow Up Instructions: Future order for TB gold placed today.   Standing orders for CBC and CMP were placed today.  She will follow up in 3 months.   I discussed the assessment and treatment plan with the patient. The patient was provided an opportunity to ask questions and all were answered. The patient agreed with the plan and demonstrated an understanding of the instructions.   The patient was advised to call back or seek an in-person evaluation if the symptoms worsen or if the condition fails to improve as anticipated.  I provided 25 minutes of non-face-to-face time during this encounter. Pollyann SavoyShaili Deveshwar, MD   Scribed by-  Gearldine Bienenstockaylor M Madelein Mahadeo, PA-C

## 2018-05-17 ENCOUNTER — Telehealth (INDEPENDENT_AMBULATORY_CARE_PROVIDER_SITE_OTHER): Payer: PPO | Admitting: Rheumatology

## 2018-05-17 ENCOUNTER — Other Ambulatory Visit: Payer: Self-pay

## 2018-05-17 ENCOUNTER — Encounter: Payer: Self-pay | Admitting: Rheumatology

## 2018-05-17 DIAGNOSIS — M47816 Spondylosis without myelopathy or radiculopathy, lumbar region: Secondary | ICD-10-CM | POA: Diagnosis not present

## 2018-05-17 DIAGNOSIS — M461 Sacroiliitis, not elsewhere classified: Secondary | ICD-10-CM | POA: Diagnosis not present

## 2018-05-17 DIAGNOSIS — Z8719 Personal history of other diseases of the digestive system: Secondary | ICD-10-CM | POA: Diagnosis not present

## 2018-05-17 DIAGNOSIS — E282 Polycystic ovarian syndrome: Secondary | ICD-10-CM | POA: Diagnosis not present

## 2018-05-17 DIAGNOSIS — Z79899 Other long term (current) drug therapy: Secondary | ICD-10-CM

## 2018-05-17 DIAGNOSIS — M170 Bilateral primary osteoarthritis of knee: Secondary | ICD-10-CM

## 2018-05-17 DIAGNOSIS — M47819 Spondylosis without myelopathy or radiculopathy, site unspecified: Secondary | ICD-10-CM

## 2018-05-17 DIAGNOSIS — I10 Essential (primary) hypertension: Secondary | ICD-10-CM

## 2018-05-17 DIAGNOSIS — F418 Other specified anxiety disorders: Secondary | ICD-10-CM

## 2018-05-29 DIAGNOSIS — F3161 Bipolar disorder, current episode mixed, mild: Secondary | ICD-10-CM | POA: Diagnosis not present

## 2018-05-29 DIAGNOSIS — G4730 Sleep apnea, unspecified: Secondary | ICD-10-CM | POA: Diagnosis not present

## 2018-06-07 ENCOUNTER — Telehealth: Payer: Self-pay | Admitting: Rheumatology

## 2018-06-07 MED ORDER — SECUKINUMAB (300 MG DOSE) 150 MG/ML ~~LOC~~ SOAJ: 300.0000 mg | SUBCUTANEOUS | 0 refills | Status: DC

## 2018-06-07 NOTE — Telephone Encounter (Signed)
Patient called stating she just ordered her last refill of Cosentyx.  Patient is requesting a new prescription to be sent to [facility].

## 2018-06-07 NOTE — Telephone Encounter (Signed)
Last Visit: 05/17/18 Next visit: 08/21/18 Labs: 04/03/18 Glucose is 118. Rest of lab work is WNL. TB Gold: 05/18/18 Neg   Patient reminded she will be due to update labs next month and we will update TB Gold with that set of lab work. Patient verbalized understanding.

## 2018-06-28 DIAGNOSIS — W268XXA Contact with other sharp object(s), not elsewhere classified, initial encounter: Secondary | ICD-10-CM | POA: Diagnosis not present

## 2018-06-28 DIAGNOSIS — Z888 Allergy status to other drugs, medicaments and biological substances status: Secondary | ICD-10-CM | POA: Diagnosis not present

## 2018-06-28 DIAGNOSIS — Z23 Encounter for immunization: Secondary | ICD-10-CM | POA: Diagnosis not present

## 2018-06-28 DIAGNOSIS — Z91040 Latex allergy status: Secondary | ICD-10-CM | POA: Diagnosis not present

## 2018-06-28 DIAGNOSIS — S61412A Laceration without foreign body of left hand, initial encounter: Secondary | ICD-10-CM | POA: Diagnosis not present

## 2018-06-28 DIAGNOSIS — Z79899 Other long term (current) drug therapy: Secondary | ICD-10-CM | POA: Diagnosis not present

## 2018-07-05 DIAGNOSIS — W268XXD Contact with other sharp object(s), not elsewhere classified, subsequent encounter: Secondary | ICD-10-CM | POA: Diagnosis not present

## 2018-07-05 DIAGNOSIS — S61211D Laceration without foreign body of left index finger without damage to nail, subsequent encounter: Secondary | ICD-10-CM | POA: Diagnosis not present

## 2018-07-10 ENCOUNTER — Ambulatory Visit: Payer: Medicare Other

## 2018-08-07 NOTE — Progress Notes (Signed)
Office Visit Note  Patient: Allison Atkinson             Date of Birth: 1975-04-21           MRN: [id number]             PCP: Carylon Perches, MD Referring: Carylon Perches, MD Visit Date: 08/21/2018 Occupation: @GUAROCC @  Subjective:  Upper back pain.    History of Present Illness: Allison Atkinson is a 43 y.o. female with history of a spondyloarthropathy.  She has been doing quite well on Cosentyx on monthly basis.  She has noticed improvement in her SI joint pain.  She states for the last 6 months she has been having pain and discomfort in the thoracic region.  She denies any radiculopathy.  She rates the pain on 0-10 about 7.  She states nothing relieves the pain.  Any activity makes the pain worse.  Activities of Daily Living:  Patient reports morning stiffness for 2 hours.   Patient Reports nocturnal pain.  Difficulty dressing/grooming: Denies Difficulty climbing stairs: Reports Difficulty getting out of chair: Denies Difficulty using hands for taps, buttons, cutlery, and/or writing: Denies  Review of Systems  Constitutional: Positive for fatigue. Negative for night sweats, weight gain and weight loss.  HENT: Negative for mouth sores, trouble swallowing, trouble swallowing, mouth dryness and nose dryness.   Eyes: Negative for pain, redness, itching, visual disturbance and dryness.  Respiratory: Negative for cough, shortness of breath, wheezing and difficulty breathing.   Cardiovascular: Negative for chest pain, palpitations, hypertension, irregular heartbeat and swelling in legs/feet.  Gastrointestinal: Positive for diarrhea. Negative for abdominal pain, blood in stool and constipation.  Endocrine: Negative for increased urination.  Genitourinary: Negative for painful urination, pelvic pain and vaginal dryness.  Musculoskeletal: Positive for arthralgias, joint pain and morning stiffness. Negative for joint swelling, myalgias, muscle weakness, muscle tenderness and myalgias.  Skin:  Negative for color change, rash, hair loss, redness, skin tightness, ulcers and sensitivity to sunlight.  Allergic/Immunologic: Negative for susceptible to infections.  Neurological: Negative for dizziness, light-headedness, headaches, memory loss, night sweats and weakness.  Hematological: Negative for swollen glands.  Psychiatric/Behavioral: Negative for depressed mood, confusion and sleep disturbance. The patient is not nervous/anxious.     PMFS History:  Patient Active Problem List   Diagnosis Date Noted   Obesity 07/04/2017   High risk medication use 03/30/2016   Acute midline low back pain without sciatica 03/30/2016   Primary osteoarthritis of both knees 01/12/2016   Spondylosis of lumbar region without myelopathy or radiculopathy 01/12/2016   Sacroiliitis, not elsewhere classified (HCC) 01/12/2016   Chronic diarrhea 01/12/2016   History of gastroesophageal reflux (GERD) 01/12/2016   History of fatty infiltration of liver 01/12/2016   Hypertension 05/09/2013   Palpitations 04/11/2013   Spondyloarthropathy (HCC) 10/24/2009   PCOS (polycystic ovarian syndrome) 01/25/2003   Depression with anxiety 01/25/2003   GERD (gastroesophageal reflux disease) 01/25/2003   Hypothyroidism 01/24/2001    Past Medical History:  Diagnosis Date   Anxiety    Bipolar disorder (HCC)    Diabetes mellitus without complication (HCC) 05/2017   Fatty liver disease, nonalcoholic    History of cholecystectomy    Hypertension    Polycystic ovarian disease    RA (rheumatoid arthritis) (HCC)    Thyroid disease    hypothyroid    Family History  Problem Relation Age of Onset   Hypertension Mother    Alcohol abuse Father    Past Surgical History:  Procedure  Laterality Date   CHOLECYSTECTOMY  1999   COLONOSCOPY W/ BIOPSIES  2010   ESOPHAGOGASTRODUODENOSCOPY  2010   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   Procedure: LAPAROSCOPIC ROUX-EN-Y GASTRIC BYPASS WITH UPPER  ENDOSCOPY;  Surgeon: Kieth Brightly, Arta Bruce, MD;  Location: [facility];  Service: General;  Laterality: N/A;   KNEE ARTHROPLASTY     KNEE ARTHROSCOPY Right    Social History   Social History Narrative   Not on file   Immunization History  Administered Date(s) Administered   Influenza,inj,Quad PF,6+ Mos 10/19/2017   Influenza-Unspecified 11/13/2012   Pneumococcal Polysaccharide-23 10/24/2008     Objective: Vital Signs: BP 100/68 (BP Location: Left Arm, Patient Position: Sitting, Cuff Size: Normal)    Pulse 90    Resp 13    Ht 5' 3.5" (1.613 m)    Wt 213 lb (96.6 kg)    BMI 37.14 kg/m    Physical Exam Vitals signs and nursing note reviewed.  Constitutional:      Appearance: She is well-developed.  HENT:     Head: Normocephalic and atraumatic.  Eyes:     Conjunctiva/sclera: Conjunctivae normal.  Neck:     Musculoskeletal: Normal range of motion.  Cardiovascular:     Rate and Rhythm: Normal rate and regular rhythm.     Heart sounds: Normal heart sounds.  Pulmonary:     Effort: Pulmonary effort is normal.     Breath sounds: Normal breath sounds.  Abdominal:     General: Bowel sounds are normal.     Palpations: Abdomen is soft.  Lymphadenopathy:     Cervical: No cervical adenopathy.  Skin:    General: Skin is warm and dry.     Capillary Refill: Capillary refill takes less than 2 seconds.  Neurological:     Mental Status: She is alert and oriented to person, place, and time.  Psychiatric:        Behavior: Behavior normal.      Musculoskeletal Exam: C-spine she had some discomfort with range of motion.  She has tenderness on palpation of the thoracic region.  Lumbar spine was in good range of motion.  She had no tenderness over SI joints today.  Shoulder joints, elbow joints, wrist, MCPs and PIPs been good range of motion with no synovitis.  Hip joints, knee joints, ankles, MTPs and PIPs with good range of motion with no synovitis.  She has some discomfort range of motion  of her right knee joint.  No warmth swelling or effusion was noted.  CDAI Exam: CDAI Score: -- Patient Global: --; Provider Global: -- Swollen: --; Tender: -- Joint Exam   No joint exam has been documented for this visit   There is currently no information documented on the homunculus. Go to the Rheumatology activity and complete the homunculus joint exam.  Investigation: No additional findings.  Imaging: No results found.  Recent Labs: Lab Results  Component Value Date   WBC 10.2 04/03/2018   HGB 13.9 04/03/2018   PLT 352 04/03/2018   NA 137 04/03/2018   K 4.5 04/03/2018   CL 104 04/03/2018   CO2 24 04/03/2018   GLUCOSE 118 (H) 04/03/2018   BUN 8 04/03/2018   CREATININE 0.69 04/03/2018   BILITOT 0.5 04/03/2018   ALKPHOS 60 07/05/2017   AST 16 04/03/2018   ALT 21 04/03/2018   PROT 6.9 04/03/2018   ALBUMIN 3.9 07/05/2017   CALCIUM 9.5 04/03/2018   GFRAA 126 04/03/2018   QFTBGOLDPLUS NEGATIVE 05/17/2017    Speciality  Comments: No specialty comments available.  Procedures:  No procedures performed Allergies: Risperdal [risperidone] and Latex   Assessment / Plan:     Visit Diagnoses: Spondyloarthropathy -patient complains of some thoracic region discomfort.  She states been going on for 6 months.  Although she denies any joint inflammation.  She has noticed improvement in her symptoms on Cosentyx.  High risk medication use - Cosentyx 300 mg every 28 days.  Last TB gold negative on 05/17/2017.  Due for TB gold today and will monitor yearly.  Most recent CBC/CMP within normal limits on 04/03/2018.  Due for CBC/CMP today and will monitor every 3 months.  Standing orders are in place.  She received the flu vaccine in September and previously Pneumovax 23.  Recommend Prevnar 13 as indicated for immunosuppressant therapy.. - Plan: CBC with Differential/Platelet, COMPLETE METABOLIC PANEL WITH GFR, QuantiFERON-TB Gold Plus,  Pain in thoracic spine -patient has been having  thoracic pain for the last 6 months.  She denies any radicular pain.  She has some tenderness on palpation in the thoracic region.  Plan: XR Thoracic Spine 2 View, consistent with mild spondylosis of the thoracic spine.  A handout on back exercises was given.  DDD (degenerative disc disease), lumbar - With moderate to spinal stenosis and facet joint arthropathy.  Doing better.  Sacroiliitis (HCC) -she had no tenderness over her SI joints.  Primary osteoarthritis of both knees -she had no warmth swelling or effusion.  She has been having some discomfort in the right knee joint.  Need for regular exercise was discussed.  History of fatty infiltration of liver -LFTs were normal in March.  Essential hypertension -blood pressure is well controlled.   PCOS (polycystic ovarian syndrome)  History of gastroesophageal reflux (GERD)   Depression with anxiety   History of hypothyroidism   Orders: Orders Placed This Encounter  Procedures   XR Thoracic Spine 2 View   CBC with Differential/Platelet   COMPLETE METABOLIC PANEL WITH GFR   QuantiFERON-TB Gold Plus   No orders of the defined types were placed in this encounter.     Follow-Up Instructions: Return in about 5 months (around 01/21/2019) for Spondyloarthropathy.   Pollyann SavoyShaili Kazuki Ingle, MD  Note - This record has been created using Animal nutritionistDragon software.  Chart creation errors have been sought, but may not always  have been located. Such creation errors do not reflect on  the standard of medical care.

## 2018-08-21 ENCOUNTER — Ambulatory Visit (INDEPENDENT_AMBULATORY_CARE_PROVIDER_SITE_OTHER): Payer: PPO

## 2018-08-21 ENCOUNTER — Encounter: Payer: PPO | Attending: General Surgery | Admitting: Dietician

## 2018-08-21 ENCOUNTER — Ambulatory Visit (INDEPENDENT_AMBULATORY_CARE_PROVIDER_SITE_OTHER): Payer: PPO | Admitting: Rheumatology

## 2018-08-21 ENCOUNTER — Other Ambulatory Visit: Payer: Self-pay

## 2018-08-21 ENCOUNTER — Encounter: Payer: Self-pay | Admitting: Physician Assistant

## 2018-08-21 VITALS — BP 100/68 | HR 90 | Resp 13 | Ht 63.5 in | Wt 213.0 lb

## 2018-08-21 DIAGNOSIS — Z8639 Personal history of other endocrine, nutritional and metabolic disease: Secondary | ICD-10-CM | POA: Diagnosis not present

## 2018-08-21 DIAGNOSIS — I10 Essential (primary) hypertension: Secondary | ICD-10-CM | POA: Diagnosis not present

## 2018-08-21 DIAGNOSIS — M5136 Other intervertebral disc degeneration, lumbar region: Secondary | ICD-10-CM

## 2018-08-21 DIAGNOSIS — E669 Obesity, unspecified: Secondary | ICD-10-CM

## 2018-08-21 DIAGNOSIS — M546 Pain in thoracic spine: Secondary | ICD-10-CM

## 2018-08-21 DIAGNOSIS — M170 Bilateral primary osteoarthritis of knee: Secondary | ICD-10-CM | POA: Diagnosis not present

## 2018-08-21 DIAGNOSIS — Z8719 Personal history of other diseases of the digestive system: Secondary | ICD-10-CM

## 2018-08-21 DIAGNOSIS — M461 Sacroiliitis, not elsewhere classified: Secondary | ICD-10-CM

## 2018-08-21 DIAGNOSIS — M47819 Spondylosis without myelopathy or radiculopathy, site unspecified: Secondary | ICD-10-CM

## 2018-08-21 DIAGNOSIS — F418 Other specified anxiety disorders: Secondary | ICD-10-CM

## 2018-08-21 DIAGNOSIS — M51369 Other intervertebral disc degeneration, lumbar region without mention of lumbar back pain or lower extremity pain: Secondary | ICD-10-CM

## 2018-08-21 DIAGNOSIS — E282 Polycystic ovarian syndrome: Secondary | ICD-10-CM

## 2018-08-21 DIAGNOSIS — Z79899 Other long term (current) drug therapy: Secondary | ICD-10-CM

## 2018-08-21 NOTE — Patient Instructions (Signed)

## 2018-08-21 NOTE — Addendum Note (Signed)
Addended by: Earnestine Mealing on: 08/21/2018 03:29 PM   Modules accepted: Orders

## 2018-08-21 NOTE — Progress Notes (Signed)
Bariatric Follow-Up Visit Post-Operative RYGB Surgery  Medical Nutrition Therapy Appt Start Time: 11:45am End Time: 12:15pm   Post-operative Bariatric Surgery Nutrition Management  1 Year Post-Op RYGB Surgery   Surgery date: 07/04/2017 Surgery type: RYGB Start weight at [facility]: 310 Weight today: 211.1 lbs   Weight change: +1.3 lbs since previous visit on 04/09/2018  Total weight lost: -98.9 lbs since start date 08/08/2016  [facility] Body Composition Results   07/19/2017 08/30/2017 10/30/2017 01/02/2018   BMI (kg/m^2) 50.1 47.8 41.8 39.6   Fat Mass (lbs) 161.0 146 123.2 107.4   Fat Free Mass (lbs) 126.4 123.8 116.4 119.8   Total Body Water (lbs) 94.6 92 85.6 87.4    Body Composition Scale 04/09/2018 08/21/2018  BMI 36.3 36.5  Total Body Fat % 40.5 40.8  Visceral Fat 11 12  Fat-Free Mass % 59.4 59.1   Total Body Water % 44.2 44.0   Muscle-Mass lbs 31.5 31.5     24 Hr Dietary Recall B: protein shake   Sn: sometimes protein bar (Power Crunch)   L: salad + hard boiled egg Sn: greek yogurt  D: grilled chicken + green beans + potatoes   Sn: sometimes protein bar of greek yogurt   Pt states she eats when she is hungry and stops when she is full, and continues to maintain small portion sizes. No issues tolerating foods, and only drinks water. States she feels as though she is eating enough food. Asked about good cereal options. Asked if it is okay to eat a snack after dinner (dinner eaten early around 5 or 6pm, eats snack around 8pm, goes to bed at 11pm.) States she is nervous about eating too many carbohydrates, and thinks she is eating less than 50 grams per day.   States her ultimate weight goal is to be below 200 lbs. States her initial (pre-surgery) weight goal was 170 lbs, but now would be satisfied with anything below 200 lbs. Was doing water aerobics consistently each week, but then COVID-19 caused gym closures right around 3 months ago (just after her most recent nutrition visit  here) so she had incorporated walking and is back into water aerobics since they reopened about a week ago.   Fluid intake: 8 bottles water/day  (16.9oz each) Medications: see list Supplementation: bariatric MVI (Celebrate), calcium (2 hours apart), biotin    Using straws: no Drinking while eating: yes (sips, tolerates fine) Chewing/swallowing difficulties: no Changes in vision: no Changes to mood/headaches: no Hair loss/Changes to skin/Changes to nails: hair loss/ no/ no Any difficulty focusing or concentrating: no Sweating: no Dizziness/Lightheaded: no Palpitations: no  Carbonated beverages: no N/V/D/C/Gas: no Abdominal Pain: no  Recent physical activity: walking daily, water aerobics 45 mins per day 3 days/week   Pt Chosen Goals:  I will walk/swim 30 minutes 5 days a week by 02/22/2018   *Pt is meeting this! Reaching total of 180 minutes/week mostly with walking (since COVID-19 temporarily cancelled water aerobics) but water classes have picked back up over the past week.   I will not drink with 3 meals 7 days a week by 01/24/2018 Progress Towards Goal(s):  Some progress.  Overall, pt is doing fantastic at following dietary and exercise recommendations. States she enjoys her foods, tolerates everything well, and is having no issues. Overall states she is satisfied with her progress but would like to lose at least about 10 more lbs to be under 200 lbs. We discussed balanced eating (protein + non-starchy veggies + complex carbohydrates) and that  increased carbohydrate intake is appropriate at this point (range between 50 and 120 grams per day.)  Handouts given during visit include:  Phase VII   Bariatric MyPlate    Teaching Method Utilized:  Visual Auditory Hands on  Demonstrated degree of understanding via:  Teach Back   Monitoring/Evaluation:  Dietary intake, exercise, body weight, and goals in 3 months.   RD's Notes for Next Visit  Evaluate overall food intake to  ensure good balance (ex: plenty of veggies and adequate carbs) and appropriate estimated calorie intake. Protein needs are definitely being met, may need to increase veggies, carbs, and overall calories?   Follow up in 3 months for 1 year + 3 months post-op nutrition visit.

## 2018-08-21 NOTE — Patient Instructions (Signed)
   Continue to meet nutrition goals, including protein, fluid, exercise, and supplements. GREAT JOB with these!!  Use the MyPlate sheet to help guide your meals, and continue to get a good balance of protein + non-starchy veggies + complex carbohydrates.   Good job of listening to your body to eat when you are hungry and stop when you are full. This is a great lifelong habit to keep in place!  See you at your follow up in 3 months!

## 2018-08-22 ENCOUNTER — Encounter: Payer: Self-pay | Admitting: Dietician

## 2018-08-22 DIAGNOSIS — E669 Obesity, unspecified: Secondary | ICD-10-CM | POA: Diagnosis not present

## 2018-08-22 DIAGNOSIS — R69 Illness, unspecified: Secondary | ICD-10-CM | POA: Diagnosis not present

## 2018-08-22 DIAGNOSIS — Z9884 Bariatric surgery status: Secondary | ICD-10-CM | POA: Diagnosis not present

## 2018-08-22 DIAGNOSIS — K912 Postsurgical malabsorption, not elsewhere classified: Secondary | ICD-10-CM | POA: Diagnosis not present

## 2018-08-22 NOTE — Progress Notes (Signed)
CBC, CMP are normal TB gold is pending.

## 2018-08-23 LAB — CBC WITH DIFFERENTIAL/PLATELET
Absolute Monocytes: 697 cells/uL (ref 200–950)
Basophils Absolute: 61 cells/uL (ref 0–200)
Basophils Relative: 0.6 %
Eosinophils Absolute: 172 cells/uL (ref 15–500)
Eosinophils Relative: 1.7 %
HCT: 42.2 % (ref 35.0–45.0)
Hemoglobin: 14 g/dL (ref 11.7–15.5)
Lymphs Abs: 3636 cells/uL (ref 850–3900)
MCH: 30.1 pg (ref 27.0–33.0)
MCHC: 33.2 g/dL (ref 32.0–36.0)
MCV: 90.8 fL (ref 80.0–100.0)
MPV: 10.2 fL (ref 7.5–12.5)
Monocytes Relative: 6.9 %
Neutro Abs: 5535 cells/uL (ref 1500–7800)
Neutrophils Relative %: 54.8 %
Platelets: 362 10*3/uL (ref 140–400)
RBC: 4.65 10*6/uL (ref 3.80–5.10)
RDW: 12.7 % (ref 11.0–15.0)
Total Lymphocyte: 36 %
WBC: 10.1 10*3/uL (ref 3.8–10.8)

## 2018-08-23 LAB — COMPLETE METABOLIC PANEL WITH GFR
AG Ratio: 1.4 (calc) (ref 1.0–2.5)
ALT: 29 U/L (ref 6–29)
AST: 20 U/L (ref 10–30)
Albumin: 4.2 g/dL (ref 3.6–5.1)
Alkaline phosphatase (APISO): 75 U/L (ref 31–125)
BUN: 18 mg/dL (ref 7–25)
CO2: 24 mmol/L (ref 20–32)
Calcium: 9.3 mg/dL (ref 8.6–10.2)
Chloride: 104 mmol/L (ref 98–110)
Creat: 0.78 mg/dL (ref 0.50–1.10)
GFR, Est African American: 109 mL/min/{1.73_m2} (ref >=60)
GFR, Est Non African American: 94 mL/min/{1.73_m2} (ref >=60)
Globulin: 3 g/dL (calc) (ref 1.9–3.7)
Glucose, Bld: 59 mg/dL — ABNORMAL LOW (ref 65–99)
Potassium: 4.8 mmol/L (ref 3.5–5.3)
Sodium: 136 mmol/L (ref 135–146)
Total Bilirubin: 0.3 mg/dL (ref 0.2–1.2)
Total Protein: 7.2 g/dL (ref 6.1–8.1)

## 2018-08-23 LAB — QUANTIFERON-TB GOLD PLUS
Mitogen-NIL: >10 IU/mL
NIL: 0.03 IU/mL
QuantiFERON-TB Gold Plus: NEGATIVE
TB1-NIL: <0 IU/mL
TB2-NIL: <0 IU/mL

## 2018-09-20 ENCOUNTER — Other Ambulatory Visit: Payer: Self-pay | Admitting: *Deleted

## 2018-09-20 MED ORDER — COSENTYX SENSOREADY (300 MG) 150 MG/ML ~~LOC~~ SOAJ: 300.0000 mg | SUBCUTANEOUS | 2 refills | Status: DC

## 2018-09-20 NOTE — Telephone Encounter (Signed)
Last Visit: 08/21/18 Next Visit: 01/22/19 Labs: 08/21/18 WNL TB Gold: 08/21/18 Neg   Okay to refill per Dr. Estanislado Pandy

## 2018-10-22 ENCOUNTER — Telehealth: Payer: Self-pay | Admitting: Rheumatology

## 2018-10-22 MED ORDER — COSENTYX SENSOREADY (300 MG) 150 MG/ML ~~LOC~~ SOAJ: 300.0000 mg | SUBCUTANEOUS | 2 refills | Status: DC

## 2018-10-22 NOTE — Telephone Encounter (Signed)
Spoke with the patient and advised she should have two refill as we sent in a prescription on 09/20/18 with two additional refills. Patient states she tried to call in a refill and was advised there were no additional refill on file for her. [facility] and they verified they did not receive the prescription in 09/20/18. Resent prescription.   Last Visit: 08/21/18 Next Visit: 01/22/19 Labs: 08/21/18 WNL TB Gold: 08/21/18 Neg   Okay to refill per Dr. Estanislado Pandy

## 2018-10-22 NOTE — Telephone Encounter (Signed)
Patient left a voicemail requesting prescription refill of Cosentyx.  Patient states "the refill needs to be CALLED into [employer] patient assistance."

## 2018-11-01 DIAGNOSIS — E782 Mixed hyperlipidemia: Secondary | ICD-10-CM | POA: Diagnosis not present

## 2018-11-01 DIAGNOSIS — E559 Vitamin D deficiency, unspecified: Secondary | ICD-10-CM | POA: Diagnosis not present

## 2018-11-01 DIAGNOSIS — E1165 Type 2 diabetes mellitus with hyperglycemia: Secondary | ICD-10-CM | POA: Diagnosis not present

## 2018-11-01 DIAGNOSIS — E039 Hypothyroidism, unspecified: Secondary | ICD-10-CM | POA: Diagnosis not present

## 2018-11-05 DIAGNOSIS — E559 Vitamin D deficiency, unspecified: Secondary | ICD-10-CM | POA: Diagnosis not present

## 2018-11-05 DIAGNOSIS — E039 Hypothyroidism, unspecified: Secondary | ICD-10-CM | POA: Diagnosis not present

## 2018-11-05 DIAGNOSIS — E1165 Type 2 diabetes mellitus with hyperglycemia: Secondary | ICD-10-CM | POA: Diagnosis not present

## 2018-11-05 DIAGNOSIS — E782 Mixed hyperlipidemia: Secondary | ICD-10-CM | POA: Diagnosis not present

## 2018-11-05 DIAGNOSIS — Z9884 Bariatric surgery status: Secondary | ICD-10-CM | POA: Diagnosis not present

## 2018-11-05 DIAGNOSIS — E282 Polycystic ovarian syndrome: Secondary | ICD-10-CM | POA: Diagnosis not present

## 2018-11-07 ENCOUNTER — Telehealth: Payer: Self-pay | Admitting: Rheumatology

## 2018-11-07 MED ORDER — COSENTYX SENSOREADY (300 MG) 150 MG/ML ~~LOC~~ SOAJ: 300.0000 mg | SUBCUTANEOUS | 2 refills | Status: DC

## 2018-11-07 NOTE — Telephone Encounter (Signed)
Spoke with Juanda Crumble at [facility] who verified they have received the prescription. He states patient just needs to call and set up shipment. Contacted patient and advised.

## 2018-11-07 NOTE — Telephone Encounter (Signed)
Patient left a voicemail stating [employer] still hasn't received her prescription for Cosentyx.  Patient states she hasn't had an injection in 2 weeks.  Patient states [employer] told her to have the office call and give a verbal at [phone]

## 2018-11-07 NOTE — Telephone Encounter (Signed)
Reached out to the patient and advised her I spoke with [employer] and at the time they were unable to answer many questions as the pharmacy was not open. Patient advised I have resent the prescription again and sent it to be processed stat. Patient advised I would reach out to them today to make sure they have received the prescription. Patient advised she may come by the office for a sample.

## 2018-11-08 DIAGNOSIS — Z23 Encounter for immunization: Secondary | ICD-10-CM | POA: Diagnosis not present

## 2018-11-20 ENCOUNTER — Ambulatory Visit: Payer: PPO | Admitting: Skilled Nursing Facility1

## 2018-12-04 DIAGNOSIS — F3162 Bipolar disorder, current episode mixed, moderate: Secondary | ICD-10-CM | POA: Diagnosis not present

## 2018-12-04 DIAGNOSIS — G4730 Sleep apnea, unspecified: Secondary | ICD-10-CM | POA: Diagnosis not present

## 2018-12-10 ENCOUNTER — Other Ambulatory Visit: Payer: Self-pay

## 2018-12-10 ENCOUNTER — Encounter: Payer: PPO | Attending: General Surgery | Admitting: Skilled Nursing Facility1

## 2018-12-10 DIAGNOSIS — E669 Obesity, unspecified: Secondary | ICD-10-CM

## 2018-12-10 NOTE — Patient Instructions (Addendum)
-  Try a plant based protein powder and decaf coffee in blender with ice or just any protein shake or fat free cool whip with coco powder and ice   -Aim to have non starchy vegetables 7 days a week with lunch AND Dinner  -Fruit with lunch is an easy way to get in energy sources   -Try to drink more water from your fridge and limit bottled water   -Try a youtube video for working out  -Eat a breakfast and get rid of the protein shake

## 2018-12-10 NOTE — Progress Notes (Signed)
Bariatric Follow-Up Visit Post-Operative RYGB Surgery  Medical Nutrition Therapy   Post-operative Bariatric Surgery Nutrition Management Post-Op RYGB Surgery   Pt states she has a sweet craving every night after dinner and has something about 7-8pm about 3 times a week. Pt states she has loose stools daily.  Surgery date: 07/04/2017 Surgery type: RYGB Start weight at [facility]: 310 Weight today: 217.3 lbs  2 Weight change: +6.2 lbs since previous visit on 04/09/2018    Body Composition Scale 04/09/2018 08/21/2018  BMI 36.3 36.5  Total Body Fat % 40.5 40.8  Visceral Fat 11 12  Fat-Free Mass % 59.4 59.1   Total Body Water % 44.2 44.0   Muscle-Mass lbs 31.5 31.5     Body Composition Scale 12/10/2018  Total Body Fat % 41.6  Visceral Fat 12  Fat-Free Mass % 58.3   Total Body Water % 43.6   Muscle-Mass lbs 31.5  Body Fat Displacement          Torso  lbs 55.9         Left Leg  lbs 11.1         Right Leg  lbs 11.1         Left Arm  lbs 5.5         Right Arm   lbs 5.5    24 Hr Dietary Recall B 10:30 protein shake   Sn: sometimes protein bar (Power Crunch)   L 12:30-1: salad + hard boiled egg or [place] cheese and ham rollup Sn 3: pistacos or dried edamama or P3 D 5-5:30: grilled chicken + green beans + potatoes or peas Sn: frappe or 1 milk way    Fluid intake: 8 bottles water/day  (16.9oz each) Medications: see list Supplementation: bariatric MVI (Celebrate), calcium (2 hours apart), biotin    Using straws: no Drinking while eating: yes (sips, tolerates fine) Chewing/swallowing difficulties: no Changes in vision: no Changes to mood/headaches: no Hair loss/Changes to skin/Changes to nails: hair loss/ no/ no Any difficulty focusing or concentrating: no Sweating: no Dizziness/Lightheaded: no Palpitations: no  Carbonated beverages: no N/V/D/C/Gas: no Abdominal Pain: no  Recent physical activity: water aerobics 45 mins per day 2 days/week  Pt Chosen Goals: -Try a plant  based protein powder and decaf coffee in blender with ice or just any protein shake or fat free cool whip with coco powder and ice  -Aim to have non starchy vegetables 7 days a week with lunch AND Dinner -Fruit with lunch is an easy way to get in energy sources  -Try to drink more water from your fridge and limit bottled water  -Try a youtube video for working out -Eat a breakfast and get rid of the protein shake  Progress Towards Goal(s):  Some progress.  Overall, pt is doing fantastic at following dietary and exercise recommendations. States she enjoys her foods, tolerates everything well. Pt is having lose stools daily may be linked to protein shake. Overall states she is satisfied with her progress but would like to lose at least about 10 more lbs to be under 200 lbs. We discussed balanced eating (protein + non-starchy veggies + complex carbohydrates) and eliminating the frappes will get her closer to that goal.   Handouts given during visit include:  myplate  Teaching Method Utilized:  Visual Auditory Hands on  Demonstrated degree of understanding via:  Teach Back   Monitoring/Evaluation:  Dietary intake, exercise, body weight, and goals in 3 months.   RD's Notes for Next Visit  Evaluate overall food intake to ensure good balance (ex: plenty of veggies and adequate carbs) and appropriate estimated calorie intake. Protein needs are definitely being met, may need to increase veggies. Check in on loose stools.     Follow up in 3 months for 1 year + 3 months post-op nutrition visit.

## 2019-01-16 ENCOUNTER — Ambulatory Visit: Payer: PPO | Admitting: Rheumatology

## 2019-01-22 ENCOUNTER — Ambulatory Visit: Payer: PPO | Admitting: Physician Assistant

## 2019-02-15 NOTE — Progress Notes (Signed)
Virtual Visit via Telephone Note  I connected with Allison Atkinson on 02/20/19 at 11:00 AM EST by telephone and verified that I am speaking with the correct person using two identifiers.  Location: Patient: Home Provider: Clinic  This service was conducted via virtual visit.  The patient was located at home. I was located in my office.  Consent was obtained prior to the virtual visit and is aware of possible charges through their insurance for this visit.  The patient is an established patient.  Dr. Estanislado Pandy, MD conducted the virtual visit and Hazel Sams, PA-C acted as scribe during the service.  Office staff helped with scheduling follow up visits after the service was conducted.   I discussed the limitations, risks, security and privacy concerns of performing an evaluation and management service by telephone and the availability of in person appointments. I also discussed with the patient that there may be a patient responsible charge related to this service. The patient expressed understanding and agreed to proceed.  CC: Left trochanteric bursitis  History of Present Illness: Patient is a 44 year old female with a past medical history of spondyloarthropathy, DDD, and osteoarthritis.  She is on Cosentyx 300 mg sq injections every 28 days. she denies any recent flares.  She denies any SI joint pain currently. She denies any thoracic or lumbar spine pain recently.  She denies any knee joint pain. She reports she has been experiencing discomfort due to left trochanteric bursitis. She states she sits and lays down most of the day.  She has been having discomfort walking prolonged distances and laying on her left side.    Review of Systems  Constitutional: Positive for malaise/fatigue. Negative for fever.  HENT: Negative for ear pain.   Eyes: Positive for pain and redness. Negative for discharge.  Respiratory: Negative for cough, shortness of breath and wheezing.   Cardiovascular: Negative for  chest pain and palpitations.  Gastrointestinal: Negative for blood in stool, constipation and diarrhea.  Genitourinary: Negative for dysuria and urgency.  Musculoskeletal: Positive for joint pain. Negative for back pain, myalgias and neck pain.  Skin: Negative for rash.  Neurological: Positive for headaches. Negative for dizziness and weakness.  Endo/Heme/Allergies: Does not bruise/bleed easily.  Psychiatric/Behavioral: Positive for depression. The patient is nervous/anxious. The patient does not have insomnia.       Observations/Objective: Physical Exam  Constitutional: She is oriented to person, place, and time.  Neurological: She is alert and oriented to person, place, and time.  Psychiatric: Mood, memory, affect and judgment normal.   Patient reports morning stiffness for 1.5 hours.   Patient reports nocturnal pain.  Difficulty dressing/grooming: Denies Difficulty climbing stairs: Reports Difficulty getting out of chair: Denies Difficulty using hands for taps, buttons, cutlery, and/or writing: Reports   Assessment and Plan: Visit Diagnoses: Spondyloarthropathy -She has not had any recent signs or symptoms of a flare.  She is clinically doing well on Cosentyx 300 mg sq injections every 28 days.  She has not missed any doses recently.  She is not having any discomfort in her C-spine, thoracic spine, and lumbar spine at this time.  She has not noticed any increased morning stiffness recently. She has not had any SI joint pain recently.  She is not having any associated symptoms.  She denies any plantar fasciitis, achilles tendonitis, blood in stool, or eye inflammation recently.  She will continue on Cosentyx 300 mg sq injections every 28 days.  She was advised to notify us if she  develops increased joint pain or joint swelling. She will follow up in 4-5 months for routine follow up visit.   High risk medication use - Cosentyx 300 mg sq injections every 28 days.  CBC and CMP were drawn  on 08/21/18.  She is overdue to update lab work.  Standing orders are in place.  TB gold negative 08/21/18.    Pain in thoracic spine -Mild spondylosis of the thoracic spine.She is not having any thoracic pain at this time.  DDD (degenerative disc disease), lumbar - With moderate to spinal stenosis and facet joint arthropathy. She has not had any increased lower back pain recently. She takes Robaxin 500 mg TID prn for muscle spasms.   Sacroiliitis (HCC) -She denies any SI joint pain.  Primary osteoarthritis of both knees -She denies any knee joint pain or inflammation currently.   Trochanteric bursitis, left hip: She has been experiencing discomfort for the past several months. Her discomfort is exacerbated by walking prolonged distances and laying on her left side at night.  She will follow up in 2 weeks for a cortisone injection.  History of fatty infiltration of liver -LFTs WNL on 08/21/18.   Other medical conditions are listed as follows:   Essential hypertension    PCOS (polycystic ovarian syndrome)  History of gastroesophageal reflux (GERD)   Depression with anxiety   History of hypothyroidism   Follow Up Instructions: She will follow up in 4-5 months   I discussed the assessment and treatment plan with the patient. The patient was provided an opportunity to ask questions and all were answered. The patient agreed with the plan and demonstrated an understanding of the instructions.   The patient was advised to call back or seek an in-person evaluation if the symptoms worsen or if the condition fails to improve as anticipated.  I provided 15 minutes of non-face-to-face time during this encounter.   Allison Savoy, MD   Scribed byLadona Ridgel Dale,PA-C

## 2019-02-20 ENCOUNTER — Other Ambulatory Visit: Payer: Self-pay

## 2019-02-20 ENCOUNTER — Encounter: Payer: Self-pay | Admitting: Rheumatology

## 2019-02-20 ENCOUNTER — Telehealth (INDEPENDENT_AMBULATORY_CARE_PROVIDER_SITE_OTHER): Payer: PPO | Admitting: Rheumatology

## 2019-02-20 DIAGNOSIS — E282 Polycystic ovarian syndrome: Secondary | ICD-10-CM | POA: Diagnosis not present

## 2019-02-20 DIAGNOSIS — M47819 Spondylosis without myelopathy or radiculopathy, site unspecified: Secondary | ICD-10-CM | POA: Diagnosis not present

## 2019-02-20 DIAGNOSIS — M461 Sacroiliitis, not elsewhere classified: Secondary | ICD-10-CM | POA: Diagnosis not present

## 2019-02-20 DIAGNOSIS — M5136 Other intervertebral disc degeneration, lumbar region: Secondary | ICD-10-CM | POA: Diagnosis not present

## 2019-02-20 DIAGNOSIS — Z8719 Personal history of other diseases of the digestive system: Secondary | ICD-10-CM

## 2019-02-20 DIAGNOSIS — M7062 Trochanteric bursitis, left hip: Secondary | ICD-10-CM | POA: Diagnosis not present

## 2019-02-20 DIAGNOSIS — I10 Essential (primary) hypertension: Secondary | ICD-10-CM | POA: Diagnosis not present

## 2019-02-20 DIAGNOSIS — Z79899 Other long term (current) drug therapy: Secondary | ICD-10-CM

## 2019-02-20 DIAGNOSIS — M170 Bilateral primary osteoarthritis of knee: Secondary | ICD-10-CM | POA: Diagnosis not present

## 2019-02-20 DIAGNOSIS — Z8639 Personal history of other endocrine, nutritional and metabolic disease: Secondary | ICD-10-CM | POA: Diagnosis not present

## 2019-02-20 DIAGNOSIS — F418 Other specified anxiety disorders: Secondary | ICD-10-CM | POA: Diagnosis not present

## 2019-02-20 DIAGNOSIS — M51369 Other intervertebral disc degeneration, lumbar region without mention of lumbar back pain or lower extremity pain: Secondary | ICD-10-CM

## 2019-02-22 ENCOUNTER — Other Ambulatory Visit: Payer: Self-pay | Admitting: *Deleted

## 2019-02-22 DIAGNOSIS — Z79899 Other long term (current) drug therapy: Secondary | ICD-10-CM | POA: Diagnosis not present

## 2019-02-23 LAB — CBC WITH DIFFERENTIAL/PLATELET
Absolute Monocytes: 739 cells/uL (ref 200–950)
Basophils Absolute: 67 cells/uL (ref 0–200)
Basophils Relative: 0.7 %
Eosinophils Absolute: 259 cells/uL (ref 15–500)
Eosinophils Relative: 2.7 %
HCT: 41 % (ref 35.0–45.0)
Hemoglobin: 13.9 g/dL (ref 11.7–15.5)
Lymphs Abs: 3917 cells/uL — ABNORMAL HIGH (ref 850–3900)
MCH: 30.8 pg (ref 27.0–33.0)
MCHC: 33.9 g/dL (ref 32.0–36.0)
MCV: 90.9 fL (ref 80.0–100.0)
MPV: 9.9 fL (ref 7.5–12.5)
Monocytes Relative: 7.7 %
Neutro Abs: 4618 cells/uL (ref 1500–7800)
Neutrophils Relative %: 48.1 %
Platelets: 361 10*3/uL (ref 140–400)
RBC: 4.51 10*6/uL (ref 3.80–5.10)
RDW: 12.6 % (ref 11.0–15.0)
Total Lymphocyte: 40.8 %
WBC: 9.6 10*3/uL (ref 3.8–10.8)

## 2019-02-23 LAB — COMPLETE METABOLIC PANEL WITH GFR
AG Ratio: 1.6 (calc) (ref 1.0–2.5)
ALT: 25 U/L (ref 6–29)
AST: 18 U/L (ref 10–30)
Albumin: 4.3 g/dL (ref 3.6–5.1)
Alkaline phosphatase (APISO): 81 U/L (ref 31–125)
BUN: 13 mg/dL (ref 7–25)
CO2: 26 mmol/L (ref 20–32)
Calcium: 9.4 mg/dL (ref 8.6–10.2)
Chloride: 107 mmol/L (ref 98–110)
Creat: 0.63 mg/dL (ref 0.50–1.10)
GFR, Est African American: 129 mL/min/{1.73_m2} (ref >=60)
GFR, Est Non African American: 111 mL/min/{1.73_m2} (ref >=60)
Globulin: 2.7 g/dL (calc) (ref 1.9–3.7)
Glucose, Bld: 52 mg/dL — ABNORMAL LOW (ref 65–99)
Potassium: 4.4 mmol/L (ref 3.5–5.3)
Sodium: 140 mmol/L (ref 135–146)
Total Bilirubin: 0.3 mg/dL (ref 0.2–1.2)
Total Protein: 7 g/dL (ref 6.1–8.1)

## 2019-02-25 ENCOUNTER — Telehealth: Payer: Self-pay | Admitting: Rheumatology

## 2019-02-25 NOTE — Progress Notes (Signed)
Glucose is mildly low at 52.  Was the patient fasting? Rest of CMP WNL.  CBC WNL

## 2019-02-25 NOTE — Telephone Encounter (Signed)
Patient states she got a call from [employer], and [employer]. Patient just returning the call.

## 2019-02-27 DIAGNOSIS — H1032 Unspecified acute conjunctivitis, left eye: Secondary | ICD-10-CM | POA: Diagnosis not present

## 2019-03-04 DIAGNOSIS — H1032 Unspecified acute conjunctivitis, left eye: Secondary | ICD-10-CM | POA: Diagnosis not present

## 2019-03-05 DIAGNOSIS — F3161 Bipolar disorder, current episode mixed, mild: Secondary | ICD-10-CM | POA: Diagnosis not present

## 2019-03-05 DIAGNOSIS — G4730 Sleep apnea, unspecified: Secondary | ICD-10-CM | POA: Diagnosis not present

## 2019-03-05 DIAGNOSIS — Z79891 Long term (current) use of opiate analgesic: Secondary | ICD-10-CM | POA: Diagnosis not present

## 2019-03-07 DIAGNOSIS — H15002 Unspecified scleritis, left eye: Secondary | ICD-10-CM | POA: Diagnosis not present

## 2019-03-07 DIAGNOSIS — H1012 Acute atopic conjunctivitis, left eye: Secondary | ICD-10-CM | POA: Diagnosis not present

## 2019-03-08 ENCOUNTER — Ambulatory Visit: Payer: PPO | Admitting: Rheumatology

## 2019-03-11 ENCOUNTER — Ambulatory Visit: Payer: PPO | Admitting: Skilled Nursing Facility1

## 2019-03-13 ENCOUNTER — Other Ambulatory Visit: Payer: Self-pay

## 2019-03-13 ENCOUNTER — Ambulatory Visit: Payer: PPO | Admitting: Rheumatology

## 2019-03-13 VITALS — BP 133/83 | HR 112

## 2019-03-13 DIAGNOSIS — M7062 Trochanteric bursitis, left hip: Secondary | ICD-10-CM | POA: Diagnosis not present

## 2019-03-13 MED ORDER — LIDOCAINE HCL 1 % IJ SOLN
1.5000 mL | INTRAMUSCULAR | Status: AC | PRN
  Administered 2019-03-13: 1.5 mL

## 2019-03-13 MED ORDER — TRIAMCINOLONE ACETONIDE 40 MG/ML IJ SUSP
40.0000 mg | INTRAMUSCULAR | Status: AC | PRN
  Administered 2019-03-13: 40 mg via INTRA_ARTICULAR

## 2019-03-13 NOTE — Progress Notes (Signed)
   Procedure Note  Patient: Allison Atkinson             Date of Birth: 10-01-1975           MRN: [id number]             Visit Date: 03/13/2019  Procedures: Visit Diagnoses:  1. Trochanteric bursitis, left hip     Large Joint Inj: L greater trochanter on 03/13/2019 2:51 PM Indications: pain Details: 27 G 1.5 in needle, lateral approach  Arthrogram: No  Medications: 1.5 mL lidocaine 1 %; 40 mg triamcinolone acetonide 40 MG/ML Aspirate: 0 mL Outcome: tolerated well, no immediate complications Procedure, treatment alternatives, risks and benefits explained, specific risks discussed. Consent was given by the patient. Immediately prior to procedure a time out was called to verify the correct patient, procedure, equipment, support staff and site/side marked as required. Patient was prepped and draped in the usual sterile fashion.    [name], MD

## 2019-03-29 ENCOUNTER — Telehealth: Payer: Self-pay | Admitting: Rheumatology

## 2019-03-29 NOTE — Telephone Encounter (Signed)
LMBEMLJ from [employer] patient assistance program left a voicemail stating patient needs a prior authorization for her prescription of Cosentyx sent to her insurance company.   [employer] phone #[phone]  If you have any questions, please call back at [phone]

## 2019-04-01 ENCOUNTER — Telehealth: Payer: Self-pay | Admitting: Pharmacist

## 2019-04-01 DIAGNOSIS — H15002 Unspecified scleritis, left eye: Secondary | ICD-10-CM | POA: Diagnosis not present

## 2019-04-01 NOTE — Telephone Encounter (Signed)
Received notification from [facility] regarding a prior authorization for COSENTYX. Authorization has been APPROVED from 04/01/19 to 01/24/20.   Authorization # [id number] Phone # [phone]  12:49 PM Blayn Whetsell Johnney Ou, CPhT

## 2019-04-01 NOTE — Telephone Encounter (Signed)
Received notification that PAP is already approved.  Nothing further needed.   Verlin Fester, PharmD, [place], CPP Clinical Specialty Pharmacist [phone]  04/01/2019 8:25 AM

## 2019-04-01 NOTE — Telephone Encounter (Signed)
Received a fax from  [employer] regarding an approval for COSENTYX from 03/30/2019 to 01/24/2020.   Reference [id number] Phone number:[phone]   Verlin Fester, PharmD, [place], CPP Clinical Specialty Pharmacist [phone]  04/01/2019 8:27 AM

## 2019-04-01 NOTE — Telephone Encounter (Signed)
Submitted a Prior Authorization request to [facility] for Cosentyx via Cover My Meds. Will update once we receive a response.  (Key: [id number]) - [id number]  9:08 AM Dorthula Nettles, CPhT

## 2019-04-08 ENCOUNTER — Ambulatory Visit: Payer: PPO | Admitting: Skilled Nursing Facility1

## 2019-04-23 ENCOUNTER — Encounter: Payer: PPO | Admitting: Skilled Nursing Facility1

## 2019-05-09 ENCOUNTER — Encounter: Payer: PPO | Attending: General Surgery | Admitting: Dietician

## 2019-05-09 ENCOUNTER — Encounter: Payer: Self-pay | Admitting: Dietician

## 2019-05-09 ENCOUNTER — Other Ambulatory Visit: Payer: Self-pay

## 2019-05-09 DIAGNOSIS — Z713 Dietary counseling and surveillance: Secondary | ICD-10-CM | POA: Insufficient documentation

## 2019-05-09 DIAGNOSIS — E669 Obesity, unspecified: Secondary | ICD-10-CM

## 2019-05-09 NOTE — Progress Notes (Signed)
Bariatric Follow-Up Visit Post-Operative RYGB Surgery  Medical Nutrition Therapy Bariatric Surgery Nutrition Management  1 Year + 10 Months Post-Op RYGB Surgery   Surgery date: 07/04/2017 Surgery type: RYGB Start weight at [facility]: 310 lbs Weight today: 231.8 lbs  [place] Body Composition Results   07/19/2017 08/30/2017 10/30/2017 01/02/2018   BMI (kg/m^2) 50.1 47.8 41.8 39.6   Fat Mass (lbs) 161.0 146 123.2 107.4   Fat Free Mass (lbs) 126.4 123.8 116.4 119.8   Total Body Water (lbs) 94.6 92 85.6 87.4     Body Composition Scale 04/09/2018 08/21/2018 12/10/2018 05/09/2019  Weight  lbs 209.8 211.1 217.3 231.8  BMI 36.3 36.5 37.8 40.1  Total Body Fat % 40.5 40.8 41.6 43.4     Visceral Fat 11 12 12 14   Fat-Free Mass % 59.4 59.1 58.3 56.5     Total Body Water % 44.2 44.0 43.6 42.7     Muscle-Mass lbs 31.5 31.5 31.5 31.6  Body Fat Displacement --- --- --- ---         Torso  lbs 52.6 - 55.9 62.4         Left Leg  lbs 10.5 - 11.1 12.4         Right Leg  lbs 10.5 - 11.1 12.4         Left Arm  lbs 5.2 - 5.5 6.2         Right Arm   lbs 5.2 - 5.5 6.2   Patient states her depression medication was doubled about 3-4 months ago which has caused her appetite to increase. Patient is concerned because her weight has also increased as well, which she has found herself eating frequently throughout the day. States she is sticking to smaller portions/serving sizes, but feels as though she gets hungry frequently and is constantly eating.  Started back to doing water aerobics 3 days/week. States she has been getting in more vegetables than before and is doing well with this goal. May still have a sweet craving in the evening, and that having fat-free Cool Whip has been helpful. Concerned with hair loss, and states she is now taking biotin and we also discussed collagen.   24 Hr Dietary Recall First meal: 10:30: protein shake   Snack: pineapple  Second Meal: 12:30-1: scrambled eggs (or salad with  ham/turkey)  Snack: yogurt Third Meal: 5-6: grilled chicken + cabbage + potatoes  Snack: protein bar Beverages: water, [employer] frappe   Fluid intake: 8 bottles water/day  (16.9oz each) Supplementation: bariatric MVI (Celebrate), calcium (2 hours apart), biotin    Using straws: no Drinking while eating: yes  Chewing/swallowing difficulties: no Changes in vision: no Changes to mood/headaches: no Hair loss/Changes to skin/Changes to nails: hair loss Any difficulty focusing or concentrating: no Sweating: no Dizziness/Lightheaded: no Palpitations: no  Carbonated beverages: no N/V/D/C/Gas: no Abdominal Pain: no  Recent physical activity: water aerobics 45 mins per day 3 days/week  Pt Chosen Goals: -Aim to have non starchy vegetables 7 days a week with lunch AND Dinner -Increase fiber and protein intake for more satisfaction and hopefully suppress increased appetite  Progress Towards Goal(s):  Some progress.  Handouts given during visit include:  Bariatric High Protein Snack Ideas  Teaching Method Utilized:  Visual Auditory Hands on  Demonstrated degree of understanding via:  Teach Back   Monitoring/Evaluation:  Dietary intake, exercise, body weight, and goals in 4 months.   Return to [facility] in ~4 months for next follow up nutrition visit.

## 2019-05-09 NOTE — Patient Instructions (Signed)
Ask your doctor/surgeon about whether or not an appetite suppressant is appropriate.   Try more "filling" foods to help curb appetite and satisfy hunger, especially protein and fiber containing foods (such as whole grains, beans, and vegetables.)  Great job with drinking lots of fluids, and with increasing physical activity!   Consider trying collagen protein (such as collagen powder) for additional hair/skin/nail support.

## 2019-05-14 DIAGNOSIS — H15002 Unspecified scleritis, left eye: Secondary | ICD-10-CM | POA: Diagnosis not present

## 2019-05-21 DIAGNOSIS — E1165 Type 2 diabetes mellitus with hyperglycemia: Secondary | ICD-10-CM | POA: Diagnosis not present

## 2019-05-21 DIAGNOSIS — Z9884 Bariatric surgery status: Secondary | ICD-10-CM | POA: Diagnosis not present

## 2019-05-21 DIAGNOSIS — E039 Hypothyroidism, unspecified: Secondary | ICD-10-CM | POA: Diagnosis not present

## 2019-05-21 DIAGNOSIS — E782 Mixed hyperlipidemia: Secondary | ICD-10-CM | POA: Diagnosis not present

## 2019-05-21 DIAGNOSIS — E559 Vitamin D deficiency, unspecified: Secondary | ICD-10-CM | POA: Diagnosis not present

## 2019-05-23 ENCOUNTER — Telehealth: Payer: Self-pay | Admitting: Rheumatology

## 2019-05-23 MED ORDER — COSENTYX SENSOREADY (300 MG) 150 MG/ML ~~LOC~~ SOAJ: 300.0000 mg | SUBCUTANEOUS | 0 refills | Status: DC

## 2019-05-23 NOTE — Telephone Encounter (Signed)
Prescription sent to the pharmacy, patient is aware.  

## 2019-05-23 NOTE — Telephone Encounter (Signed)
Last Visit: 03/13/2019  Next Visit: 07/23/2019  Labs: 02/22/2019 Glucose is mildly low at 52. Rest of CMP WNL. CBC WNL TB Gold: 08/21/2018 negative   Okay to refill per Dr. Corliss Skains.

## 2019-05-23 NOTE — Addendum Note (Signed)
Addended by: Ellen Henri on: 05/23/2019 10:43 AM   Modules accepted: Orders

## 2019-05-23 NOTE — Telephone Encounter (Signed)
Patient called requesting prescription refill of Cosentyx to be sent to [employer] patient assistance.   

## 2019-05-27 DIAGNOSIS — E559 Vitamin D deficiency, unspecified: Secondary | ICD-10-CM | POA: Diagnosis not present

## 2019-05-27 DIAGNOSIS — E039 Hypothyroidism, unspecified: Secondary | ICD-10-CM | POA: Diagnosis not present

## 2019-05-27 DIAGNOSIS — Z9884 Bariatric surgery status: Secondary | ICD-10-CM | POA: Diagnosis not present

## 2019-05-27 DIAGNOSIS — E1169 Type 2 diabetes mellitus with other specified complication: Secondary | ICD-10-CM | POA: Diagnosis not present

## 2019-05-27 DIAGNOSIS — E1165 Type 2 diabetes mellitus with hyperglycemia: Secondary | ICD-10-CM | POA: Diagnosis not present

## 2019-05-27 DIAGNOSIS — E282 Polycystic ovarian syndrome: Secondary | ICD-10-CM | POA: Diagnosis not present

## 2019-05-27 DIAGNOSIS — E782 Mixed hyperlipidemia: Secondary | ICD-10-CM | POA: Diagnosis not present

## 2019-06-13 DIAGNOSIS — H15002 Unspecified scleritis, left eye: Secondary | ICD-10-CM | POA: Diagnosis not present

## 2019-07-10 NOTE — Progress Notes (Addendum)
Office Visit Note  Patient: Allison Atkinson             Date of Birth: 1975-02-21           MRN: [id number]             PCP: Carylon Perches, MD Referring: Carylon Perches, MD Visit Date: 07/23/2019 Occupation: @GUAROCC @  Subjective:  Other (increased joint pain/stiffness )   History of Present Illness: Allison Atkinson is a 44 y.o. female with history of a spondyloarthropathy, degenerative disc disease and osteoarthritis.  She states she has been having pain in bilateral hips which she points to the trochanteric area.  She states the right trochanteric pain is radiating down into her right thigh.  She denies any discomfort in the SI joints.  She states she has been taking Cosentyx every month.  She denies any joint swelling.  She has been tolerating medication well.  She states she still has some redness in her eyes.  She has been followed by Dr. 45.  She has an appointment coming up tomorrow with them.  Activities of Daily Living:  Patient reports morning stiffness for 2 hours.   Patient Reports nocturnal pain.  Difficulty dressing/grooming: Denies Difficulty climbing stairs: Reports Difficulty getting out of chair: Reports Difficulty using hands for taps, buttons, cutlery, and/or writing: Denies  Review of Systems  Constitutional: Positive for fatigue. Negative for night sweats, weight gain and weight loss.  HENT: Negative for mouth sores, trouble swallowing, trouble swallowing, mouth dryness and nose dryness.   Eyes: Positive for pain and redness. Negative for visual disturbance and dryness.  Respiratory: Negative for cough, shortness of breath and difficulty breathing.   Cardiovascular: Negative for chest pain, palpitations, hypertension, irregular heartbeat and swelling in legs/feet.  Gastrointestinal: Negative for blood in stool, constipation and diarrhea.  Endocrine: Negative for increased urination.  Genitourinary: Negative for difficulty urinating and vaginal dryness.    Musculoskeletal: Positive for arthralgias, joint pain, myalgias, morning stiffness, muscle tenderness and myalgias. Negative for joint swelling and muscle weakness.  Skin: Negative for color change, rash, hair loss, redness, skin tightness, ulcers and sensitivity to sunlight.  Allergic/Immunologic: Negative for susceptible to infections.  Neurological: Positive for headaches, memory loss and weakness. Negative for dizziness, numbness and night sweats.  Hematological: Negative for bruising/bleeding tendency and swollen glands.  Psychiatric/Behavioral: Negative for depressed mood, confusion and sleep disturbance. The patient is not nervous/anxious.     PMFS History:  Patient Active Problem List   Diagnosis Date Noted  . Obesity 07/04/2017  . High risk medication use 03/30/2016  . Acute midline low back pain without sciatica 03/30/2016  . Primary osteoarthritis of both knees 01/12/2016  . Spondylosis of lumbar region without myelopathy or radiculopathy 01/12/2016  . Sacroiliitis, not elsewhere classified (HCC) 01/12/2016  . Chronic diarrhea 01/12/2016  . History of gastroesophageal reflux (GERD) 01/12/2016  . History of fatty infiltration of liver 01/12/2016  . Hypertension 05/09/2013  . Palpitations 04/11/2013  . Spondyloarthropathy (HCC) 10/24/2009  . PCOS (polycystic ovarian syndrome) 01/25/2003  . Depression with anxiety 01/25/2003  . GERD (gastroesophageal reflux disease) 01/25/2003  . Hypothyroidism 01/24/2001    Past Medical History:  Diagnosis Date  . Anxiety   . Bipolar disorder (HCC)   . Diabetes mellitus without complication (HCC) 05/2017  . Fatty liver disease, nonalcoholic   . History of cholecystectomy   . Hypertension   . Polycystic ovarian disease   . RA (rheumatoid arthritis) (HCC)   . Thyroid disease  hypothyroid    Family History  Problem Relation Age of Onset  . Hypertension Mother   . Alcohol abuse Father    Past Surgical History:  Procedure  Laterality Date  . CHOLECYSTECTOMY  1999  . COLONOSCOPY W/ BIOPSIES  2010  . ESOPHAGOGASTRODUODENOSCOPY  2010  . 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 KNEE ARTHROPLASTY    . KNEE ARTHROSCOPY Right    Social History   Social History Narrative  . Not on file   Immunization History  Administered Date(s) Administered  . Influenza,inj,Quad PF,6+ Mos 10/19/2017  . Influenza-Unspecified 11/13/2012  . Moderna SARS-COVID-2 Vaccination 04/25/2019, 05/23/2019  . Pneumococcal Polysaccharide-23 10/24/2008     Objective: Vital Signs: BP 123/82 (BP Location: Left Arm, Patient Position: Sitting, Cuff Size: Normal)   Pulse (!) 109   Resp 15   Ht 5' 3.5" (1.613 m)   Wt 240 lb 3.2 oz (109 kg)   BMI 41.88 kg/m    Physical Exam Vitals and nursing note reviewed.  Constitutional:      Appearance: She is well-developed.  HENT:     Head: Normocephalic and atraumatic.  Eyes:     Conjunctiva/sclera: Conjunctivae normal.     Comments: Left conjunctival injection was noted.  Cardiovascular:     Rate and Rhythm: Normal rate and regular rhythm.     Heart sounds: Normal heart sounds.  Pulmonary:     Effort: Pulmonary effort is normal.     Breath sounds: Normal breath sounds.  Abdominal:     General: Bowel sounds are normal.     Palpations: Abdomen is soft.  Musculoskeletal:     Cervical back: Normal range of motion.  Lymphadenopathy:     Cervical: No cervical adenopathy.  Skin:    General: Skin is warm and dry.     Capillary Refill: Capillary refill takes less than 2 seconds.  Neurological:     Mental Status: She is alert and oriented to person, place, and time.  Psychiatric:        Behavior: Behavior normal.      Musculoskeletal Exam: C-spine thoracic and lumbar spine were in good range of motion.  She had no SI joint  tenderness.  Shoulder joints, elbow joints, wrist joints, MCPs PIPs and DIPs with good range of motion with no synovitis.  She has good range of motion of her hip joints, knee joints, ankles, MTPs and PIPs.  She had tenderness over bilateral trochanteric bursa consistent with trochanteric bursitis.  CDAI Exam: CDAI Score: -- Patient Global: --; Provider Global: -- Swollen: --; Tender: -- Joint Exam 07/23/2019   No joint exam has been documented for this visit   There is currently no information documented on the homunculus. Go to the Rheumatology activity and complete the homunculus joint exam.  Investigation: No additional findings.  Imaging: No results found.  Recent Labs: Lab Results  Component Value Date   WBC 9.6 02/22/2019   HGB 13.9 02/22/2019   PLT 361 02/22/2019   NA 140 02/22/2019   K 4.4 02/22/2019   CL 107 02/22/2019   CO2 26 02/22/2019   GLUCOSE 52 (L) 02/22/2019   BUN 13 02/22/2019   CREATININE 0.63 02/22/2019   BILITOT 0.3 02/22/2019   ALKPHOS 60 07/05/2017   AST 18 02/22/2019   ALT 25 02/22/2019   PROT 7.0 02/22/2019   ALBUMIN 3.9  07/05/2017   CALCIUM 9.4 02/22/2019   GFRAA 129 02/22/2019   QFTBGOLDPLUS NEGATIVE 08/21/2018    Speciality Comments: No specialty comments available.  Procedures:  Large Joint Inj: bilateral greater trochanter on 07/23/2019 3:57 PM Indications: pain Details: 27 G 1.5 in needle, lateral approach  Arthrogram: No  Medications (Right): 1.5 mL lidocaine 1 %; 40 mg triamcinolone acetonide 40 MG/ML Medications (Left): 1.5 mL lidocaine 1 %; 40 mg triamcinolone acetonide 40 MG/ML Outcome: tolerated well, no immediate complications Procedure, treatment alternatives, risks and benefits explained, specific risks discussed. Consent was given by the patient. Immediately prior to procedure a time out was called to verify the correct patient, procedure, equipment, support staff and site/side marked as required. Patient was prepped and  draped in the usual sterile fashion.     Allergies: Risperdal [risperidone] and Latex   Assessment / Plan:     Visit Diagnoses: Spondyloarthropathy-patient has been tolerating Cosentyx well.  She denies any joint swelling.  SI joint tenderness is resolved.  She does not have much thoracic or lumbar discomfort except for some muscular pain.  High risk medication use - Cosentyx 300 mg sq injections every 28 days - Plan: CBC with Differential/Platelet, COMPLETE METABOLIC PANEL WITH GFR, QuantiFERON-TB Gold Plus today.  Her last labs were in January.  Need for getting labs on a regular basis to monitor for toxicity was discussed at length.  She will need labs every 3 months.  Patient was on Humira in the past until 2018.  Humira was discontinued due to inadequate response in 2018.  Sacroiliitis (HCC)-she had no SI joint tenderness today.  Trochanteric bursitis of both hips-she had tenderness on palpation of bilateral trochanteric bursa.  After treatment options were discussed bilateral trochanteric bursa were injected with cortisone as described above.  She tolerated the procedure well.  A handout on IT band stretching was given.  Pain in thoracic spine - Mild spondylosis of the thoracic spine.  She has some muscular discomfort.  DDD (degenerative disc disease), lumbar - With moderate to spinal stenosis and facet joint arthropathy.  Primary osteoarthritis of both knees-currently not having much discomfort.  Eye redness-patient has seen Dr. Dione Booze.  I do not have any records from Dr. Dione Booze.  Patient has a follow-up appointment tomorrow.  (I received records from Dr. Laruth Bouchard office after patient left.  Patient was diagnosed with episcleritis.  If patient has persistent issue then we may have to add methotrexate to her regimen.  Cosentyx is not helpful and controlling episcleritis.)  Essential hypertension-blood pressure is normal.  History of fatty infiltration of liver  History of  gastroesophageal reflux (GERD)  PCOS (polycystic ovarian syndrome)  Depression with anxiety  History of hypothyroidism  Orders: Orders Placed This Encounter  Procedures  . Large Joint Inj  . CBC with Differential/Platelet  . COMPLETE METABOLIC PANEL WITH GFR  . QuantiFERON-TB Gold Plus   No orders of the defined types were placed in this encounter.    Follow-Up Instructions: Return in about 5 months (around 12/23/2019) for Psoriatic arthritis<DDD.   [name], MD  Note - This record has been created using Animal nutritionist.  Chart creation errors have been sought, but may not always  have been located. Such creation errors do not reflect on  the standard of medical care.

## 2019-07-23 ENCOUNTER — Ambulatory Visit: Payer: PPO | Admitting: Rheumatology

## 2019-07-23 ENCOUNTER — Encounter: Payer: Self-pay | Admitting: Rheumatology

## 2019-07-23 ENCOUNTER — Other Ambulatory Visit: Payer: Self-pay

## 2019-07-23 VITALS — BP 123/82 | HR 109 | Resp 15 | Ht 63.5 in | Wt 240.2 lb

## 2019-07-23 DIAGNOSIS — M47819 Spondylosis without myelopathy or radiculopathy, site unspecified: Secondary | ICD-10-CM

## 2019-07-23 DIAGNOSIS — M461 Sacroiliitis, not elsewhere classified: Secondary | ICD-10-CM | POA: Diagnosis not present

## 2019-07-23 DIAGNOSIS — Z8719 Personal history of other diseases of the digestive system: Secondary | ICD-10-CM | POA: Diagnosis not present

## 2019-07-23 DIAGNOSIS — H15102 Unspecified episcleritis, left eye: Secondary | ICD-10-CM

## 2019-07-23 DIAGNOSIS — M546 Pain in thoracic spine: Secondary | ICD-10-CM | POA: Diagnosis not present

## 2019-07-23 DIAGNOSIS — M51369 Other intervertebral disc degeneration, lumbar region without mention of lumbar back pain or lower extremity pain: Secondary | ICD-10-CM

## 2019-07-23 DIAGNOSIS — Z8639 Personal history of other endocrine, nutritional and metabolic disease: Secondary | ICD-10-CM | POA: Diagnosis not present

## 2019-07-23 DIAGNOSIS — I10 Essential (primary) hypertension: Secondary | ICD-10-CM | POA: Diagnosis not present

## 2019-07-23 DIAGNOSIS — M7062 Trochanteric bursitis, left hip: Secondary | ICD-10-CM | POA: Diagnosis not present

## 2019-07-23 DIAGNOSIS — E282 Polycystic ovarian syndrome: Secondary | ICD-10-CM

## 2019-07-23 DIAGNOSIS — M5136 Other intervertebral disc degeneration, lumbar region: Secondary | ICD-10-CM

## 2019-07-23 DIAGNOSIS — F418 Other specified anxiety disorders: Secondary | ICD-10-CM | POA: Diagnosis not present

## 2019-07-23 DIAGNOSIS — M170 Bilateral primary osteoarthritis of knee: Secondary | ICD-10-CM | POA: Diagnosis not present

## 2019-07-23 DIAGNOSIS — M7061 Trochanteric bursitis, right hip: Secondary | ICD-10-CM

## 2019-07-23 DIAGNOSIS — Z79899 Other long term (current) drug therapy: Secondary | ICD-10-CM

## 2019-07-23 MED ORDER — TRIAMCINOLONE ACETONIDE 40 MG/ML IJ SUSP
40.0000 mg | INTRAMUSCULAR | Status: AC | PRN
  Administered 2019-07-23: 40 mg via INTRA_ARTICULAR

## 2019-07-23 MED ORDER — LIDOCAINE HCL 1 % IJ SOLN
1.5000 mL | INTRAMUSCULAR | Status: AC | PRN
  Administered 2019-07-23: 1.5 mL

## 2019-07-23 NOTE — Patient Instructions (Addendum)
Standing Labs We placed an order today for your standing lab work.   Please have your standing labs drawn in October and every 3 months  If possible, please have your labs drawn 2 weeks prior to your appointment so that the provider can discuss your results at your appointment.  We have open lab daily Monday through Thursday from 8:30-12:30 PM and 1:30-4:30 PM and Friday from 8:30-12:30 PM and 1:30-4:00 PM at the office of Dr. [name], [facility].   You may experience shorter wait times on Monday and Friday afternoons. The office is located at [address], [place], [place] [address] No appointment is necessary.   Labs are drawn by [facility].  You may receive a bill from [facility] for your lab work.  If you wish to have your labs drawn at another location, please call the office 24 hours in advance to send orders.  If you have any questions regarding directions or hours of operation,  please call [phone].   As a reminder, please drink plenty of water prior to coming for your lab work. Thanks!   Iliotibial Band Syndrome Rehab Ask your health care provider which exercises are safe for you. Do exercises exactly as told by your health care provider and adjust them as directed. It is normal to feel mild stretching, pulling, tightness, or discomfort as you do these exercises. Stop right away if you feel sudden pain or your pain gets significantly worse. Do not begin these exercises until told by your health care provider. Stretching and range-of-motion exercises These exercises warm up your muscles and joints and improve the movement and flexibility of your hip and pelvis. Quadriceps stretch, prone  1. Lie on your abdomen on a firm surface, such as a bed or padded floor (prone position). 2. Bend your left / right knee and reach back to hold your ankle or pant leg. If you cannot reach your ankle or pant leg, loop a belt around your foot and grab the belt  instead. 3. Gently pull your heel toward your buttocks. Your knee should not slide out to the side. You should feel a stretch in the front of your thigh and knee (quadriceps). 4. Hold this position for __________ seconds. Repeat __________ times. Complete this exercise __________ times a day. Iliotibial band stretch An iliotibial band is a strong band of muscle tissue that runs from the outer side of your hip to the outer side of your thigh and knee. 1. Lie on your side with your left / right leg in the top position. 2. Bend both of your knees and grab your left / right ankle. Stretch out your bottom arm to help you balance. 3. Slowly bring your top knee back so your thigh goes behind your trunk. 4. Slowly lower your top leg toward the floor until you feel a gentle stretch on the outside of your left / right hip and thigh. If you do not feel a stretch and your knee will not fall farther, place the heel of your other foot on top of your knee and pull your knee down toward the floor with your foot. 5. Hold this position for __________ seconds. Repeat __________ times. Complete this exercise __________ times a day. Strengthening exercises These exercises build strength and endurance in your hip and pelvis. Endurance is the ability to use your muscles for a long time, even after they get tired. Straight leg raises, side-lying This exercise strengthens the muscles that rotate the leg  at the hip and move it away from your body (hip abductors). 1. Lie on your side with your left / right leg in the top position. Lie so your head, shoulder, hip, and knee line up. You may bend your bottom knee to help you balance. 2. Roll your hips slightly forward so your hips are stacked directly over each other and your left / right knee is facing forward. 3. Tense the muscles in your outer thigh and lift your top leg 4-6 inches (10-15 cm). 4. Hold this position for __________ seconds. 5. Slowly return to the starting  position. Let your muscles relax completely before doing another repetition. Repeat __________ times. Complete this exercise __________ times a day. Leg raises, prone This exercise strengthens the muscles that move the hips (hip extensors). 1. Lie on your abdomen on your bed or a firm surface. You can put a pillow under your hips if that is more comfortable for your lower back. 2. Bend your left / right knee so your foot is straight up in the air. 3. Squeeze your buttocks muscles and lift your left / right thigh off the bed. Do not let your back arch. 4. Tense your thigh muscle as hard as you can without increasing any knee pain. 5. Hold this position for __________ seconds. 6. Slowly lower your leg to the starting position and allow it to relax completely. Repeat __________ times. Complete this exercise __________ times a day. Hip hike 1. Stand sideways on a bottom step. Stand on your left / right leg with your other foot unsupported next to the step. You can hold on to the railing or wall for balance if needed. 2. Keep your knees straight and your torso square. Then lift your left / right hip up toward the ceiling. 3. Slowly let your left / right hip lower toward the floor, past the starting position. Your foot should get closer to the floor. Do not lean or bend your knees. Repeat __________ times. Complete this exercise __________ times a day. This information is not intended to replace advice given to you by your health care provider. Make sure you discuss any questions you have with your health care provider. Document Revised: 05/03/2018 Document Reviewed: 11/01/2017 Elsevier Patient Education  2020 [employer].

## 2019-07-24 NOTE — Progress Notes (Signed)
CBC and CMP are normal except the WBC is mildly elevated . We will continue to monitor.

## 2019-07-25 LAB — CBC WITH DIFFERENTIAL/PLATELET
Absolute Monocytes: 767 cells/uL (ref 200–950)
Basophils Absolute: 71 cells/uL (ref 0–200)
Basophils Relative: 0.6 %
Eosinophils Absolute: 295 cells/uL (ref 15–500)
Eosinophils Relative: 2.5 %
HCT: 42.6 % (ref 35.0–45.0)
Hemoglobin: 14.3 g/dL (ref 11.7–15.5)
Lymphs Abs: 3505 cells/uL (ref 850–3900)
MCH: 30.8 pg (ref 27.0–33.0)
MCHC: 33.6 g/dL (ref 32.0–36.0)
MCV: 91.6 fL (ref 80.0–100.0)
MPV: 9.9 fL (ref 7.5–12.5)
Monocytes Relative: 6.5 %
Neutro Abs: 7163 cells/uL (ref 1500–7800)
Neutrophils Relative %: 60.7 %
Platelets: 396 10*3/uL (ref 140–400)
RBC: 4.65 10*6/uL (ref 3.80–5.10)
RDW: 12.4 % (ref 11.0–15.0)
Total Lymphocyte: 29.7 %
WBC: 11.8 10*3/uL — ABNORMAL HIGH (ref 3.8–10.8)

## 2019-07-25 LAB — QUANTIFERON-TB GOLD PLUS
Mitogen-NIL: >10 IU/mL
NIL: 0.03 IU/mL
QuantiFERON-TB Gold Plus: NEGATIVE
TB1-NIL: <0 IU/mL
TB2-NIL: <0 IU/mL

## 2019-07-25 LAB — COMPLETE METABOLIC PANEL WITH GFR
AG Ratio: 1.6 (calc) (ref 1.0–2.5)
ALT: 24 U/L (ref 6–29)
AST: 17 U/L (ref 10–30)
Albumin: 4.6 g/dL (ref 3.6–5.1)
Alkaline phosphatase (APISO): 76 U/L (ref 31–125)
BUN: 14 mg/dL (ref 7–25)
CO2: 28 mmol/L (ref 20–32)
Calcium: 9.7 mg/dL (ref 8.6–10.2)
Chloride: 100 mmol/L (ref 98–110)
Creat: 0.69 mg/dL (ref 0.50–1.10)
GFR, Est African American: 124 mL/min/{1.73_m2} (ref >=60)
GFR, Est Non African American: 107 mL/min/{1.73_m2} (ref >=60)
Globulin: 2.8 g/dL (calc) (ref 1.9–3.7)
Glucose, Bld: 74 mg/dL (ref 65–99)
Potassium: 4.6 mmol/L (ref 3.5–5.3)
Sodium: 136 mmol/L (ref 135–146)
Total Bilirubin: 0.3 mg/dL (ref 0.2–1.2)
Total Protein: 7.4 g/dL (ref 6.1–8.1)

## 2019-07-25 NOTE — Progress Notes (Signed)
TB gold is negative.

## 2019-08-01 DIAGNOSIS — H15092 Other scleritis, left eye: Secondary | ICD-10-CM | POA: Diagnosis not present

## 2019-08-05 ENCOUNTER — Telehealth: Payer: Self-pay | Admitting: Rheumatology

## 2019-08-05 MED ORDER — COSENTYX SENSOREADY (300 MG) 150 MG/ML ~~LOC~~ SOAJ: 300.0000 mg | SUBCUTANEOUS | 0 refills | Status: DC

## 2019-08-05 NOTE — Telephone Encounter (Signed)
Patient called requesting prescription refill of Cosentyx to be sent to the patient assistance program.

## 2019-08-05 NOTE — Telephone Encounter (Signed)
Last Visit: 07/23/2019 Next Visit: 12/24/2019 Labs: 07/23/2019 CBC and CMP are normal except the WBC is mildly elevated . We will continue to monitor. TB Gold: 07/23/2019 negative   Current Dose per office note on 07/23/2019: Cosentyx 300 mg sq injections every 28 days   DX: Spondyloarthropathy  Okay to refill cosentyx?

## 2019-09-02 ENCOUNTER — Other Ambulatory Visit: Payer: Self-pay | Admitting: Rheumatology

## 2019-09-02 NOTE — Telephone Encounter (Signed)
Please call the patient to clarify if she is still taking methocarbamol.

## 2019-09-02 NOTE — Telephone Encounter (Signed)
Last Visit: 07/23/2019 Next visit: 12/24/2019  Last Fill: 04/18/2018  Okay to refill Methocarbamol?

## 2019-09-02 NOTE — Telephone Encounter (Signed)
Patient states she only takes it on an as needed basis.

## 2019-09-09 ENCOUNTER — Ambulatory Visit: Payer: PPO | Admitting: Skilled Nursing Facility1

## 2019-10-08 DIAGNOSIS — N390 Urinary tract infection, site not specified: Secondary | ICD-10-CM | POA: Diagnosis not present

## 2019-10-16 ENCOUNTER — Ambulatory Visit: Payer: PPO | Admitting: Skilled Nursing Facility1

## 2019-10-21 DIAGNOSIS — Z1231 Encounter for screening mammogram for malignant neoplasm of breast: Secondary | ICD-10-CM | POA: Diagnosis not present

## 2019-10-21 DIAGNOSIS — Z6841 Body Mass Index (BMI) 40.0 and over, adult: Secondary | ICD-10-CM | POA: Diagnosis not present

## 2019-10-21 DIAGNOSIS — E782 Mixed hyperlipidemia: Secondary | ICD-10-CM | POA: Diagnosis not present

## 2019-10-21 DIAGNOSIS — E1165 Type 2 diabetes mellitus with hyperglycemia: Secondary | ICD-10-CM | POA: Diagnosis not present

## 2019-10-21 DIAGNOSIS — E039 Hypothyroidism, unspecified: Secondary | ICD-10-CM | POA: Diagnosis not present

## 2019-10-21 DIAGNOSIS — Z124 Encounter for screening for malignant neoplasm of cervix: Secondary | ICD-10-CM | POA: Diagnosis not present

## 2019-10-22 ENCOUNTER — Other Ambulatory Visit: Payer: Self-pay

## 2019-10-22 DIAGNOSIS — Z79899 Other long term (current) drug therapy: Secondary | ICD-10-CM

## 2019-10-23 ENCOUNTER — Other Ambulatory Visit: Payer: Self-pay | Admitting: Obstetrics and Gynecology

## 2019-10-23 DIAGNOSIS — N6320 Unspecified lump in the left breast, unspecified quadrant: Secondary | ICD-10-CM

## 2019-10-23 LAB — CBC WITH DIFFERENTIAL/PLATELET
Absolute Monocytes: 547 cells/uL (ref 200–950)
Basophils Absolute: 67 cells/uL (ref 0–200)
Basophils Relative: 0.7 %
Eosinophils Absolute: 77 cells/uL (ref 15–500)
Eosinophils Relative: 0.8 %
HCT: 40.4 % (ref 35.0–45.0)
Hemoglobin: 13.7 g/dL (ref 11.7–15.5)
Lymphs Abs: 3562 cells/uL (ref 850–3900)
MCH: 31.2 pg (ref 27.0–33.0)
MCHC: 33.9 g/dL (ref 32.0–36.0)
MCV: 92 fL (ref 80.0–100.0)
MPV: 10.1 fL (ref 7.5–12.5)
Monocytes Relative: 5.7 %
Neutro Abs: 5347 cells/uL (ref 1500–7800)
Neutrophils Relative %: 55.7 %
Platelets: 401 10*3/uL — ABNORMAL HIGH (ref 140–400)
RBC: 4.39 10*6/uL (ref 3.80–5.10)
RDW: 12.9 % (ref 11.0–15.0)
Total Lymphocyte: 37.1 %
WBC: 9.6 10*3/uL (ref 3.8–10.8)

## 2019-10-23 LAB — COMPLETE METABOLIC PANEL WITH GFR
AG Ratio: 1.6 (calc) (ref 1.0–2.5)
ALT: 19 U/L (ref 6–29)
AST: 15 U/L (ref 10–30)
Albumin: 4 g/dL (ref 3.6–5.1)
Alkaline phosphatase (APISO): 66 U/L (ref 31–125)
BUN: 8 mg/dL (ref 7–25)
CO2: 27 mmol/L (ref 20–32)
Calcium: 9.1 mg/dL (ref 8.6–10.2)
Chloride: 103 mmol/L (ref 98–110)
Creat: 0.65 mg/dL (ref 0.50–1.10)
GFR, Est African American: 127 mL/min/{1.73_m2} (ref >=60)
GFR, Est Non African American: 110 mL/min/{1.73_m2} (ref >=60)
Globulin: 2.5 g/dL (calc) (ref 1.9–3.7)
Glucose, Bld: 78 mg/dL (ref 65–99)
Potassium: 4.3 mmol/L (ref 3.5–5.3)
Sodium: 138 mmol/L (ref 135–146)
Total Bilirubin: 0.4 mg/dL (ref 0.2–1.2)
Total Protein: 6.5 g/dL (ref 6.1–8.1)

## 2019-10-23 NOTE — Progress Notes (Signed)
Plts are borderline elevated-401. Rest of CBC WNL.  CMP WNL. No change in therapy recommended.

## 2019-11-04 ENCOUNTER — Other Ambulatory Visit: Payer: Self-pay

## 2019-11-04 NOTE — Telephone Encounter (Signed)
Patient left a voicemail requesting prescription refill of Cosentyx to be sent to [facility].

## 2019-11-05 MED ORDER — COSENTYX SENSOREADY (300 MG) 150 MG/ML ~~LOC~~ SOAJ
300.0000 mg | SUBCUTANEOUS | 0 refills | Status: DC
Start: 2019-11-05 — End: 2020-01-14

## 2019-11-05 NOTE — Telephone Encounter (Signed)
Last Visit: 07/23/2019 Next Visit: 12/24/2019 Labs: 10/22/2019 Plts are borderline elevated-401. Rest of CBC WNL. CMP WNL. No change in therapy recommended. TB Gold: 07/23/2019 negative   Current Dose per office note on 07/23/2019: Cosentyx 300 mg sq injections every 28 days   BB:[id number]  Okay to refill cosentyx?

## 2019-11-11 ENCOUNTER — Ambulatory Visit
Admission: RE | Admit: 2019-11-11 | Discharge: 2019-11-11 | Disposition: A | Discharge status: Home / Self Care | Payer: PPO | Source: Ambulatory Visit | Attending: Obstetrics and Gynecology | Admitting: Obstetrics and Gynecology

## 2019-11-11 ENCOUNTER — Other Ambulatory Visit: Payer: Self-pay

## 2019-11-11 DIAGNOSIS — N6489 Other specified disorders of breast: Secondary | ICD-10-CM | POA: Diagnosis not present

## 2019-11-11 DIAGNOSIS — N6320 Unspecified lump in the left breast, unspecified quadrant: Secondary | ICD-10-CM

## 2019-11-11 DIAGNOSIS — R928 Other abnormal and inconclusive findings on diagnostic imaging of breast: Secondary | ICD-10-CM | POA: Diagnosis not present

## 2019-11-29 DIAGNOSIS — H15111 Episcleritis periodica fugax, right eye: Secondary | ICD-10-CM | POA: Diagnosis not present

## 2019-12-10 NOTE — Progress Notes (Signed)
Office Visit Note  Patient: Allison Atkinson             Date of Birth: 07-24-1975           MRN: [id number]             PCP: Carylon Perches, MD Referring: Carylon Perches, MD Visit Date: 12/24/2019 Occupation: @GUAROCC @  Subjective:  medication management.  Recurrent episcleritis.   History of Present Illness: Allison Atkinson is a 44 y.o. female with history of a spondyloarthropathy, sacroiliitis and episcleritis.  She had episode of episcleritis in her left eye January 2021 and had cystic symptoms until July 2021.  She states she developed right eye symptoms in October and still is dealing with the persistent issues.  She has been followed by Dr. November.  She states that Cosentyx has done really well as regards to her lower back pain compared to Humira.  She had tried methotrexate several years ago in the very beginning and had no intolerance to methotrexate although it was not effective for her lower back symptoms.  Denies any joint swelling currently.  She continues to have some stiffness in her lower back but it is tolerable.  She denies any symptoms of plantar fasciitis or Achilles tendinitis.  There is no history of rash.  Activities of Daily Living:  Patient reports morning stiffness for 2 hours.   Patient Reports nocturnal pain.  Difficulty dressing/grooming: Denies Difficulty climbing stairs: Reports Difficulty getting out of chair: Reports Difficulty using hands for taps, buttons, cutlery, and/or writing: Denies  Review of Systems  Constitutional: Positive for fatigue.  HENT: Negative for mouth sores, mouth dryness and nose dryness.   Eyes: Positive for photophobia, pain, redness and visual disturbance. Negative for itching and dryness.  Respiratory: Negative for shortness of breath and difficulty breathing.   Cardiovascular: Negative for chest pain and palpitations.  Gastrointestinal: Negative for blood in stool, constipation and diarrhea.  Endocrine: Negative for increased  urination.  Genitourinary: Negative for difficulty urinating.  Musculoskeletal: Positive for arthralgias, joint pain and morning stiffness. Negative for joint swelling, myalgias, muscle tenderness and myalgias.  Skin: Negative for color change, rash and redness.  Allergic/Immunologic: Negative for susceptible to infections.  Neurological: Positive for headaches and memory loss. Negative for dizziness, numbness and weakness.  Hematological: Positive for bruising/bleeding tendency.  Psychiatric/Behavioral: Negative for confusion and sleep disturbance.    PMFS History:  Patient Active Problem List   Diagnosis Date Noted  . Obesity 07/04/2017  . High risk medication use 03/30/2016  . Acute midline low back pain without sciatica 03/30/2016  . Primary osteoarthritis of both knees 01/12/2016  . Spondylosis of lumbar region without myelopathy or radiculopathy 01/12/2016  . Sacroiliitis, not elsewhere classified (HCC) 01/12/2016  . Chronic diarrhea 01/12/2016  . History of gastroesophageal reflux (GERD) 01/12/2016  . History of fatty infiltration of liver 01/12/2016  . Hypertension 05/09/2013  . Palpitations 04/11/2013  . Spondyloarthropathy (HCC) 10/24/2009  . PCOS (polycystic ovarian syndrome) 01/25/2003  . Depression with anxiety 01/25/2003  . GERD (gastroesophageal reflux disease) 01/25/2003  . Hypothyroidism 01/24/2001    Past Medical History:  Diagnosis Date  . Anxiety   . Bipolar disorder (HCC)   . Diabetes mellitus without complication (HCC) 05/2017  . Fatty liver disease, nonalcoholic   . History of cholecystectomy   . Hypertension   . Polycystic ovarian disease   . RA (rheumatoid arthritis) (HCC)   . Thyroid disease    hypothyroid    Family History  Problem Relation Age of Onset  . Hypertension Mother   . Alcohol abuse Father    Past Surgical History:  Procedure Laterality Date  . CHOLECYSTECTOMY  1999  . COLONOSCOPY W/ BIOPSIES  2010  . ESOPHAGOGASTRODUODENOSCOPY   2010  . 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 KNEE ARTHROPLASTY    . KNEE ARTHROSCOPY Right    Social History   Social History Narrative  . Not on file   Immunization History  Administered Date(s) Administered  . Influenza,inj,Quad PF,6+ Mos 10/19/2017  . Influenza-Unspecified 11/13/2012  . Moderna SARS-COVID-2 Vaccination 04/25/2019, 05/23/2019, 12/12/2019  . Pneumococcal Polysaccharide-23 10/24/2008     Objective: Vital Signs: BP 116/81 (BP Location: Left Arm, Patient Position: Sitting, Cuff Size: Normal)   Pulse 78   Resp 15   Ht 5' 3.5" (1.613 m)   Wt 252 lb 6.4 oz (114.5 kg)   BMI 44.01 kg/m    Physical Exam Vitals and nursing note reviewed.  Constitutional:      Appearance: She is well-developed.  HENT:     Head: Normocephalic and atraumatic.  Eyes:     Conjunctiva/sclera: Conjunctivae normal.  Cardiovascular:     Rate and Rhythm: Normal rate and regular rhythm.     Heart sounds: Normal heart sounds.  Pulmonary:     Effort: Pulmonary effort is normal.     Breath sounds: Normal breath sounds.  Abdominal:     General: Bowel sounds are normal.     Palpations: Abdomen is soft.  Musculoskeletal:     Cervical back: Normal range of motion.  Lymphadenopathy:     Cervical: No cervical adenopathy.  Skin:    General: Skin is warm and dry.     Capillary Refill: Capillary refill takes less than 2 seconds.  Neurological:     Mental Status: She is alert and oriented to person, place, and time.  Psychiatric:        Behavior: Behavior normal.      Musculoskeletal Exam: She had no discomfort range of motion of her C-spine.  Thoracic and lumbar spine with good range of motion.  She had no SI joint tenderness.  Shoulder joints, elbow joints, wrist joints, MCPs PIPs and DIPs and good range of motion with no  synovitis.  Hip joints, knee joints, ankles, MTPs and PIPs with good range of motion with no synovitis.  She had no tenderness over plantar fascia or Achilles tendon.  CDAI Exam: CDAI Score: -- Patient Global: --; Provider Global: -- Swollen: --; Tender: -- Joint Exam 12/24/2019   No joint exam has been documented for this visit   There is currently no information documented on the homunculus. Go to the Rheumatology activity and complete the homunculus joint exam.  Investigation: No additional findings.  Imaging: No results found.  Recent Labs: Lab Results  Component Value Date   WBC 9.6 10/22/2019   HGB 13.7 10/22/2019   PLT 401 (H) 10/22/2019   NA 138 10/22/2019   K 4.3 10/22/2019   CL 103 10/22/2019   CO2 27 10/22/2019   GLUCOSE 78 10/22/2019   BUN 8 10/22/2019   CREATININE 0.65 10/22/2019   BILITOT 0.4 10/22/2019   ALKPHOS 60 07/05/2017   AST 15 10/22/2019   ALT 19 10/22/2019   PROT 6.5 10/22/2019   ALBUMIN 3.9 07/05/2017   CALCIUM 9.1 10/22/2019   GFRAA  127 10/22/2019   QFTBGOLDPLUS NEGATIVE 07/23/2019    Speciality Comments: Patient was on Humira in the past.  Patient had an adequate response to Humira and was switched to Cosentyx.  Procedures:  No procedures performed Allergies: Risperdal [risperidone] and Latex   Assessment / Plan:     Visit Diagnoses: Spondyloarthropathy-she is doing very well as regards to SI joint discomfort on Cosentyx.  She has been tolerating Cosentyx well.  She has some thoracic pain but the lower back pain has improved.  She had an adequate response to Humira in the past.  Sacroiliitis (HCC)-SI joint pain has improved.  High risk medication use - Cosentyx 300 mg sq injections every 28 days.  Her labs have been stable.  Episcleritis of left eye - Dr. Dione Booze.  She has been having recurrent and persistent symptoms of episcleritis.  We had detailed discussion regarding indications side effects contraindications of methotrexate.  She  has taken methotrexate several years ago in the past.  She tolerated the medication well but it was not effective.  We will start her on methotrexate 0.6 mL subcu weekly and if labs are stable in 2 weeks then we will increase the dose to 0.8 mL subcu weekly.  I would also add folic acid 2 mg p.o. daily.  Indications side effects contraindications were discussed at length.  Handout was given and consent was taken.  We will call in methotrexate once we get confirmation regarding her contraception.  I have advised her to ask her provider for the office note after she gets the contraception.  Drug Counseling TB Gold: July 23, 2019 Hepatitis panel: October 15, 2008  Chest-xray: October 05, 2016  Contraception: Condoms, patient plans to start on oral contraceptive pills.  She will make appointment with GYN.  Alcohol use: Occasional  Patient was counseled on the purpose, proper use, and adverse effects of methotrexate including nausea, infection, and signs and symptoms of pneumonitis.  Reviewed instructions with patient to take methotrexate weekly along with folic acid daily.  Discussed the importance of frequent monitoring of kidney and liver function and blood counts, and provided patient with standing lab instructions.  Counseled patient to avoid NSAIDs and alcohol while on methotrexate.  Provided patient with educational materials on methotrexate and answered all questions.  Advised patient to get annual influenza vaccine and to get a pneumococcal vaccine if patient has not already had one.  Patient voiced understanding.  Patient consented to methotrexate use.  Will upload into chart.    Trochanteric bursitis of both hips-doing better  Pain in thoracic spine - Mild spondylosis of the thoracic spine.  She has chronic pain.  DDD (degenerative disc disease), lumbar-improved.  Primary osteoarthritis of both knees-she has some right knee joint discomfort.  Left knee joint is better.  Essential  hypertension-controlled.  History of fatty infiltration of liver-weight loss diet and exercise was discussed.  I would also give her a handout on weight management clinic.  History of gastroesophageal reflux (GERD)  PCOS (polycystic ovarian syndrome)  Depression with anxiety  History of hypothyroidism  BMI 44.01-weight loss diet and exercise was emphasized.  Have given her a handout on weight management clinic.  Orders: No orders of the defined types were placed in this encounter.  No orders of the defined types were placed in this encounter.    Follow-Up Instructions: Return in about 6 weeks (around 02/04/2020) for Spondyloarthropathy, episcleritis.   [name], MD  Note - This record has been created using Animal nutritionist.  Chart creation errors have been sought, but may not always  have been located. Such creation errors do not reflect on  the standard of medical care.

## 2019-12-16 DIAGNOSIS — H1012 Acute atopic conjunctivitis, left eye: Secondary | ICD-10-CM | POA: Diagnosis not present

## 2019-12-16 DIAGNOSIS — H15002 Unspecified scleritis, left eye: Secondary | ICD-10-CM | POA: Diagnosis not present

## 2019-12-16 DIAGNOSIS — H15111 Episcleritis periodica fugax, right eye: Secondary | ICD-10-CM | POA: Diagnosis not present

## 2019-12-17 ENCOUNTER — Telehealth: Payer: Self-pay | Admitting: Pharmacist

## 2019-12-17 NOTE — Telephone Encounter (Signed)
Received renewal application from [employer] for [employer] patient assistance. Completed office portion and faxed to company. Copy sent to scan center.  Spoke with patient about required information. She has upcoming appointment on 12/24/19 with Dr. Corliss Skains, and we will plan to complete her portion of the application then. Routing to [employer] as FYI.  Initiated PA renewal. Cover My Meds key: [id number]  Chesley Mires, PharmD, MPH Clinical Pharmacist (Rheumatology and Pulmonology)

## 2019-12-18 DIAGNOSIS — Z9884 Bariatric surgery status: Secondary | ICD-10-CM | POA: Diagnosis not present

## 2019-12-18 DIAGNOSIS — E282 Polycystic ovarian syndrome: Secondary | ICD-10-CM | POA: Diagnosis not present

## 2019-12-18 DIAGNOSIS — E039 Hypothyroidism, unspecified: Secondary | ICD-10-CM | POA: Diagnosis not present

## 2019-12-18 DIAGNOSIS — E782 Mixed hyperlipidemia: Secondary | ICD-10-CM | POA: Diagnosis not present

## 2019-12-18 DIAGNOSIS — E559 Vitamin D deficiency, unspecified: Secondary | ICD-10-CM | POA: Diagnosis not present

## 2019-12-18 DIAGNOSIS — E119 Type 2 diabetes mellitus without complications: Secondary | ICD-10-CM | POA: Diagnosis not present

## 2019-12-24 ENCOUNTER — Other Ambulatory Visit: Payer: Self-pay

## 2019-12-24 ENCOUNTER — Encounter: Payer: Self-pay | Admitting: Rheumatology

## 2019-12-24 ENCOUNTER — Ambulatory Visit: Payer: PPO | Admitting: Rheumatology

## 2019-12-24 VITALS — BP 116/81 | HR 78 | Resp 15 | Ht 63.5 in | Wt 252.4 lb

## 2019-12-24 DIAGNOSIS — Z8639 Personal history of other endocrine, nutritional and metabolic disease: Secondary | ICD-10-CM

## 2019-12-24 DIAGNOSIS — E282 Polycystic ovarian syndrome: Secondary | ICD-10-CM | POA: Diagnosis not present

## 2019-12-24 DIAGNOSIS — M170 Bilateral primary osteoarthritis of knee: Secondary | ICD-10-CM

## 2019-12-24 DIAGNOSIS — F418 Other specified anxiety disorders: Secondary | ICD-10-CM | POA: Diagnosis not present

## 2019-12-24 DIAGNOSIS — Z79899 Other long term (current) drug therapy: Secondary | ICD-10-CM

## 2019-12-24 DIAGNOSIS — M5136 Other intervertebral disc degeneration, lumbar region: Secondary | ICD-10-CM

## 2019-12-24 DIAGNOSIS — Z8719 Personal history of other diseases of the digestive system: Secondary | ICD-10-CM | POA: Diagnosis not present

## 2019-12-24 DIAGNOSIS — H15102 Unspecified episcleritis, left eye: Secondary | ICD-10-CM | POA: Diagnosis not present

## 2019-12-24 DIAGNOSIS — M47819 Spondylosis without myelopathy or radiculopathy, site unspecified: Secondary | ICD-10-CM

## 2019-12-24 DIAGNOSIS — I10 Essential (primary) hypertension: Secondary | ICD-10-CM | POA: Diagnosis not present

## 2019-12-24 DIAGNOSIS — M7061 Trochanteric bursitis, right hip: Secondary | ICD-10-CM | POA: Diagnosis not present

## 2019-12-24 DIAGNOSIS — Z6841 Body Mass Index (BMI) 40.0 and over, adult: Secondary | ICD-10-CM

## 2019-12-24 DIAGNOSIS — M461 Sacroiliitis, not elsewhere classified: Secondary | ICD-10-CM

## 2019-12-24 DIAGNOSIS — M7062 Trochanteric bursitis, left hip: Secondary | ICD-10-CM

## 2019-12-24 DIAGNOSIS — M546 Pain in thoracic spine: Secondary | ICD-10-CM | POA: Diagnosis not present

## 2019-12-24 NOTE — Telephone Encounter (Signed)
Patient completed the patient portion of patient assistance application. Application is on Devki's desk for review.

## 2019-12-24 NOTE — Patient Instructions (Addendum)
Methotrexate subcutaneous injection What is this medicine? METHOTREXATE (METH oh TREX ate) is a cytotoxic drug that also suppresses the immune system. It is used to treat psoriasis and rheumatoid arthritis. This medicine may be used for other purposes; ask your health care provider or pharmacist if you have questions. COMMON BRAND NAME(S): Otrexup, Rasuvo What should I tell my health care provider before I take this medicine? They need to know if you have any of these conditions:  fluid in the stomach area or lungs  if you often drink alcohol  infection or immune system problems  kidney disease  liver disease  low blood counts, like low white cell, platelet, or red cell counts  lung disease  radiation therapy  stomach ulcers  ulcerative colitis  an unusual or allergic reaction to methotrexate, other medicines, foods, dyes, or preservatives  pregnant or trying to get pregnant  breast-feeding How should I use this medicine? This medicine is for injection under the skin. You will be taught how to prepare and give this medicine. Refer to the Instructions for Use that come with your medication packaging. Use exactly as directed. Take your medicine at regular intervals. Do not take your medicine more often than directed. This medicine should be taken weekly, NOT daily. It is important that you put your used needles and syringes in a special sharps container. Do not put them in a trash can. If you do not have a sharps container, call your pharmacist of healthcare provider to get one. Talk to your pediatrician regarding the use of this medicine in children. While this drug may be prescribed for children as young as 2 years for selected conditions, precautions do apply. Overdosage: If you think you have taken too much of this medicine contact a poison control center or emergency room at once. NOTE: This medicine is only for you. Do not share this medicine with others. What if I miss a  dose? If you are not sure if this medicine was injected or if you have a hard time giving the injection, do not inject another dose. Talk with your doctor or health care professional. What may interact with this medicine? This medicine may interact with the following medications:  acitretin  aspirin or aspirin-like medicines including salicylates  azathioprine  certain antibiotics like chloramphenicol, penicillin, tetracycline  cyclosporine  gold  hydroxychloroquine  live virus vaccines  mercaptopurine  NSAIDs, medicines for pain and inflammation, like ibuprofen or naproxen  other cytotoxic agents  penicillamine  phenylbutazone  phenytoin  probenacid  retinoids such as isotretinoin and tretinoin  steroid medicines like prednisone or cortisone  sulfonamides like sulfasalazine and trimethoprim/sulfamethoxazole  theophylline This list may not describe all possible interactions. Give your health care provider a list of all the medicines, herbs, non-prescription drugs, or dietary supplements you use. Also tell them if you smoke, drink alcohol, or use illegal drugs. Some items may interact with your medicine. What should I watch for while using this medicine? Avoid alcoholic drinks. This medicine can make you more sensitive to the sun. Keep out of the sun. If you cannot avoid being in the sun, wear protective clothing and use sunscreen. Do not use sun lamps or tanning beds/booths. You may get drowsy or dizzy. Do not drive, use machinery, or do anything that needs mental alertness until you know how this medicine affects you. Do not stand or sit up quickly, especially if you are an older patient. This reduces the risk of dizzy or fainting spells. You may  need blood work done while you are taking this medicine. Call your doctor or health care professional for advice if you get a fever, chills or sore throat, or other symptoms of a cold or flu. Do not treat yourself. This drug  decreases your body's ability to fight infections. Try to avoid being around people who are sick. This medicine may increase your risk to bruise or bleed. Call your doctor or health care professional if you notice any unusual bleeding. Check with your doctor or health care professional if you get an attack of severe diarrhea, nausea and vomiting, or if you sweat a lot. The loss of too much body fluid can make it dangerous for you to take this medicine. Talk to your doctor about your risk of cancer. You may be more at risk for certain types of cancers if you take this medicine. Both men and women must use effective birth control with this medicine. Do not become pregnant while taking this medicine or until at least 1 normal menstrual cycle has occurred after stopping it. Women should inform their doctor if they wish to become pregnant or think they might be pregnant. Men should not father a child while taking this medicine and for 3 months after stopping it. There is a potential for serious side effects to an unborn child. Talk to your health care professional or pharmacist for more information. Do not breast-feed an infant while taking this medicine. What side effects may I notice from receiving this medicine? Side effects that you should report to your doctor or health care professional as soon as possible:  allergic reactions like skin rash, itching or hives, swelling of the face, lips, or tongue  breathing problems or shortness of breath  diarrhea  dry, nonproductive cough  low blood counts - this medicine may decrease the number of white blood cells, red blood cells and platelets. You may be at increased risk of infections and bleeding  mouth sores  redness, blistering, peeling or loosening of the skin, including inside the mouth  signs of infection - fever or chills, cough, sore throat, pain or difficulty passing urine  signs and symptoms of bleeding such as bloody or black, tarry  stools; red or dark-brown urine; spitting up blood or brown material that looks like coffee grounds; red spots on the skin; unusual bruising or bleeding from the eye, gums, or nose  signs and symptoms of kidney injury like trouble passing urine or change in the amount of urine  signs and symptoms of liver injury like dark yellow or brown urine; general ill feeling or flu-like symptoms; light-colored stools; loss of appetite; nausea; right upper belly pain; unusually weak or tired; yellowing of the eyes or skin Side effects that usually do not require medical attention (report to your doctor or health care professional if they continue or are bothersome):  dizziness  hair loss  headache  stomach pain  upset stomach  vomiting This list may not describe all possible side effects. Call your doctor for medical advice about side effects. You may report side effects to FDA at 1-800-FDA-1088. Where should I keep my medicine? Keep out of the reach of children. You will be instructed on how to store this medicine. Throw away any unused medicine after the expiration date on the label. NOTE: This sheet is a summary. It may not cover all possible information. If you have questions about this medicine, talk to your doctor, pharmacist, or health care provider.  2020 Elsevier/Gold Standard (  2016-09-01 13:37:10)  Standing Labs We placed an order today for your standing lab work.   Please have your standing labs drawn in 2 weeks after starting methotrexate x2 and then every 2 months.  If labs are stable we will change it to every 3 months.  If possible, please have your labs drawn 2 weeks prior to your appointment so that the provider can discuss your results at your appointment.  We have open lab daily Monday through Thursday from 8:30-12:30 PM and 1:30-4:30 PM and Friday from 8:30-12:30 PM and 1:30-4:00 PM at the office of Dr. [name], [facility].   Please be advised,  patients with office appointments requiring lab work will take precedents over walk-in lab work.  If possible, please come for your lab work on Monday and Friday afternoons, as you may experience shorter wait times. The office is located at [address], [place], [place] [address] No appointment is necessary.   Labs are drawn by [facility]. Please bring your co-pay at the time of your lab draw.  You may receive a bill from [facility] for your lab work.  If you wish to have your labs drawn at another location, please call the office 24 hours in advance to send orders.  If you have any questions regarding directions or hours of operation,  please call [phone].   As a reminder, please drink plenty of water prior to coming for your lab work. Thanks!   Folic acid 1 mg tablets 2 tablets daily  Heart Disease Prevention   Your inflammatory disease increases your risk of heart disease which includes heart attack, stroke, atrial fibrillation (irregular heartbeats), high blood pressure, heart failure and atherosclerosis (plaque in the arteries).  It is important to reduce your risk by:   . Keep blood pressure, cholesterol, and blood sugar at healthy levels   . Smoking Cessation   . Maintain a healthy weight  o BMI 20-25   . Eat a healthy diet  o Plenty of fresh fruit, vegetables, and whole grains  o Limit saturated fats, foods high in sodium, and added sugars  o DASH and Mediterranean diet   . Increase physical activity  o Recommend moderate physically activity for 150 minutes per week/ 30 minutes a day for five days a week These can be broken up into three separate ten-minute sessions during the day.   . Reduce Stress  . Meditation, slow breathing exercises, yoga, coloring books  . Dental visits twice a year

## 2019-12-24 NOTE — Telephone Encounter (Signed)
Pending note from GYN, patient will be re-starting methotrexate (vial and syringe) and folic acid.  New consent obtained and sent to the scan center.

## 2019-12-25 NOTE — Telephone Encounter (Addendum)
Submitted Patient Assistance Application renewal to [employer] for [employer] along with provider portion, patient application, and insurance documents. Prior [employer] not available through Cover My Meds (Key: [id number]).  Will update patient when we receive a response.  Fax# [phone] Phone# [phone]  Chesley Mires, PharmD, MPH Clinical Pharmacist (Rheumatology and Pulmonology)

## 2019-12-26 ENCOUNTER — Telehealth: Payer: Self-pay

## 2019-12-26 NOTE — Telephone Encounter (Signed)
Faxed Prior Authorization coverage determination request to [facility]. Documented and follow-up will occur in other encounter from 12/17/19.  Chesley Mires, PharmD, MPH Clinical Pharmacist (Rheumatology and Pulmonology)

## 2019-12-26 NOTE — Telephone Encounter (Signed)
[facility] left a voicemail stating they have additional questions regarding Cosentyx medication for patient and requested a return call at #[phone] Option 3

## 2019-12-26 NOTE — Telephone Encounter (Addendum)
Submitted a signed Prior Authorization (coverage determination) renewal request to [facility] for COSENTYX via faxed form. Will update once we receive a response.  Case key: [id number]  Phone: [phone] Fax: [phone]  Chesley Mires, PharmD, MPH Clinical Pharmacist (Rheumatology and Pulmonology)

## 2019-12-30 NOTE — Telephone Encounter (Signed)
Received notification from [facility] regarding a prior authorization for COSENTYX. Authorization has been APPROVED through 12/26/20.  Will fax approval to [employer].  Copy of approval letter sent to scan center.  Phone # [phone] Fax: [phone]  Chesley Mires, PharmD, MPH Clinical Pharmacist (Rheumatology and Pulmonology)

## 2020-01-14 ENCOUNTER — Other Ambulatory Visit: Payer: Self-pay | Admitting: *Deleted

## 2020-01-14 MED ORDER — COSENTYX SENSOREADY (300 MG) 150 MG/ML ~~LOC~~ SOAJ
300.0000 mg | SUBCUTANEOUS | 0 refills | Status: DC
Start: 2020-01-14 — End: 2020-04-14

## 2020-01-14 NOTE — Telephone Encounter (Signed)
Refill request received via fax  Last Visit: 12/24/2019 Next Visit: 02/04/2020 Labs: 10/22/2019 Plts are borderline elevated-401. Rest of CBC WNL. CMP WNL.  TB Gold: 07/23/2019 Neg   Current Dose per office note 12/24/2019: Cosentyx 300 mg sq injections every 28 days  DX: Spondyloarthropathy  Okay to refill Cosentyx?

## 2020-01-15 NOTE — Telephone Encounter (Signed)
Spoke with patient and she states she has called and left 2 message to get an appointment scheduled or a prescription for birth control. Patient states she will attempt to call again and schedule an appointment.

## 2020-01-22 ENCOUNTER — Encounter (HOSPITAL_COMMUNITY): Payer: Self-pay

## 2020-01-22 NOTE — Telephone Encounter (Signed)
Spoke with patient and she states she has not received a call back from gyn after reaching out again. Patient to call back again today and try to schedule an appointment. Patient to call back to advise [place] when appointment is scheduled for.

## 2020-01-22 NOTE — Progress Notes (Deleted)
Office Visit Note  Patient: Allison Atkinson             Date of Birth: April 04, 1975           MRN: [id number]             PCP: Carylon Perches, MD Referring: Carylon Perches, MD Visit Date: 02/04/2020 Occupation: @GUAROCC @  Subjective:  No chief complaint on file.   History of Present Illness: Allison Atkinson is a 44 y.o. female ***   Activities of Daily Living:  Patient reports morning stiffness for *** {minute/hour:19697}.   Patient {ACTIONS;DENIES/REPORTS:21021675::"Denies"} nocturnal pain.  Difficulty dressing/grooming: {ACTIONS;DENIES/REPORTS:21021675::"Denies"} Difficulty climbing stairs: {ACTIONS;DENIES/REPORTS:21021675::"Denies"} Difficulty getting out of chair: {ACTIONS;DENIES/REPORTS:21021675::"Denies"} Difficulty using hands for taps, buttons, cutlery, and/or writing: {ACTIONS;DENIES/REPORTS:21021675::"Denies"}  No Rheumatology ROS completed.   PMFS History:  Patient Active Problem List   Diagnosis Date Noted  . Obesity 07/04/2017  . High risk medication use 03/30/2016  . Acute midline low back pain without sciatica 03/30/2016  . Primary osteoarthritis of both knees 01/12/2016  . Spondylosis of lumbar region without myelopathy or radiculopathy 01/12/2016  . Sacroiliitis, not elsewhere classified (HCC) 01/12/2016  . Chronic diarrhea 01/12/2016  . History of gastroesophageal reflux (GERD) 01/12/2016  . History of fatty infiltration of liver 01/12/2016  . Hypertension 05/09/2013  . Palpitations 04/11/2013  . Spondyloarthropathy (HCC) 10/24/2009  . PCOS (polycystic ovarian syndrome) 01/25/2003  . Depression with anxiety 01/25/2003  . GERD (gastroesophageal reflux disease) 01/25/2003  . Hypothyroidism 01/24/2001    Past Medical History:  Diagnosis Date  . Anxiety   . Bipolar disorder (HCC)   . Diabetes mellitus without complication (HCC) 05/2017  . Fatty liver disease, nonalcoholic   . History of cholecystectomy   . Hypertension   . Polycystic ovarian disease   .  RA (rheumatoid arthritis) (HCC)   . Thyroid disease    hypothyroid    Family History  Problem Relation Age of Onset  . Hypertension Mother   . Alcohol abuse Father    Past Surgical History:  Procedure Laterality Date  . CHOLECYSTECTOMY  1999  . COLONOSCOPY W/ BIOPSIES  2010  . ESOPHAGOGASTRODUODENOSCOPY  2010  . 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 KNEE ARTHROPLASTY    . KNEE ARTHROSCOPY Right    Social History   Social History Narrative  . Not on file   Immunization History  Administered Date(s) Administered  . Influenza,inj,Quad PF,6+ Mos 10/19/2017  . Influenza-Unspecified 11/13/2012  . Moderna Sars-Covid-2 Vaccination 04/25/2019, 05/23/2019, 12/12/2019  . Pneumococcal Polysaccharide-23 10/24/2008     Objective: Vital Signs: There were no vitals taken for this visit.   Physical Exam   Musculoskeletal Exam: ***  CDAI Exam: CDAI Score: -- Patient Global: --; Provider Global: -- Swollen: --; Tender: -- Joint Exam 02/04/2020   No joint exam has been documented for this visit   There is currently no information documented on the homunculus. Go to the Rheumatology activity and complete the homunculus joint exam.  Investigation: No additional findings.  Imaging: No results found.  Recent Labs: Lab Results  Component Value Date   WBC 9.6 10/22/2019   HGB 13.7 10/22/2019   PLT 401 (H) 10/22/2019   NA 138 10/22/2019   K 4.3 10/22/2019   CL 103 10/22/2019   CO2 27 10/22/2019   GLUCOSE 78 10/22/2019   BUN 8 10/22/2019  CREATININE 0.65 10/22/2019   BILITOT 0.4 10/22/2019   ALKPHOS 60 07/05/2017   AST 15 10/22/2019   ALT 19 10/22/2019   PROT 6.5 10/22/2019   ALBUMIN 3.9 07/05/2017   CALCIUM 9.1 10/22/2019   GFRAA 127 10/22/2019   QFTBGOLDPLUS NEGATIVE 07/23/2019    Speciality Comments:  Patient was on Humira in the past.  Patient had an adequate response to Humira and was switched to Cosentyx.  Procedures:  No procedures performed Allergies: Risperdal [risperidone] and Latex   Assessment / Plan:     Visit Diagnoses: No diagnosis found.  Orders: No orders of the defined types were placed in this encounter.  No orders of the defined types were placed in this encounter.   Face-to-face time spent with patient was *** minutes. Greater than 50% of time was spent in counseling and coordination of care.  Follow-Up Instructions: No follow-ups on file.   Ellen Henri, CMA  Note - This record has been created using Animal nutritionist.  Chart creation errors have been sought, but may not always  have been located. Such creation errors do not reflect on  the standard of medical care.

## 2020-01-28 NOTE — Telephone Encounter (Signed)
Please call the GYN office and explained the necessity for patient to start the oral contraceptive pills as soon as possible.

## 2020-01-28 NOTE — Telephone Encounter (Signed)
Attempted to contact the patient and left message for patient to cal the office.  

## 2020-01-30 MED ORDER — FOLIC ACID 1 MG PO TABS: 2.0000 mg | ORAL_TABLET | Freq: Every day | ORAL | 3 refills | Status: DC

## 2020-01-30 MED ORDER — METHOTREXATE SODIUM CHEMO INJECTION 50 MG/2ML: INTRAMUSCULAR | 0 refills | Status: DC

## 2020-01-30 MED ORDER — "TUBERCULIN SYRINGE 27G X 1/2"" 1 ML MISC": 12.0000 | 3 refills | Status: DC

## 2020-01-30 NOTE — Telephone Encounter (Signed)
Patient contacted the office stating she was able to get in touch with her GYN. patient states she was prescribed a birth control pill in which she will pick up today.   Per office note on 12/24/2019: We will start her on methotrexate 0.6 mL subcu weekly and if labs are stable in 2 weeks then we will increase the dose to 0.8 mL subcu weekly.  I would also add folic acid 2 mg p.o. daily.

## 2020-01-30 NOTE — Addendum Note (Signed)
Addended by: Henriette Combs on: 01/30/2020 02:31 PM   Modules accepted: Orders

## 2020-02-04 ENCOUNTER — Ambulatory Visit: Payer: PPO | Admitting: Physician Assistant

## 2020-02-05 ENCOUNTER — Other Ambulatory Visit: Payer: Self-pay | Admitting: *Deleted

## 2020-02-05 DIAGNOSIS — Z79899 Other long term (current) drug therapy: Secondary | ICD-10-CM

## 2020-02-06 LAB — CBC WITH DIFFERENTIAL/PLATELET
Absolute Monocytes: 628 cells/uL (ref 200–950)
Basophils Absolute: 82 cells/uL (ref 0–200)
Basophils Relative: 0.8 %
Eosinophils Absolute: 206 cells/uL (ref 15–500)
Eosinophils Relative: 2 %
HCT: 42.6 % (ref 35.0–45.0)
Hemoglobin: 14.2 g/dL (ref 11.7–15.5)
Lymphs Abs: 3080 cells/uL (ref 850–3900)
MCH: 30.5 pg (ref 27.0–33.0)
MCHC: 33.3 g/dL (ref 32.0–36.0)
MCV: 91.6 fL (ref 80.0–100.0)
MPV: 10.2 fL (ref 7.5–12.5)
Monocytes Relative: 6.1 %
Neutro Abs: 6304 cells/uL (ref 1500–7800)
Neutrophils Relative %: 61.2 %
Platelets: 422 10*3/uL — ABNORMAL HIGH (ref 140–400)
RBC: 4.65 10*6/uL (ref 3.80–5.10)
RDW: 12.5 % (ref 11.0–15.0)
Total Lymphocyte: 29.9 %
WBC: 10.3 10*3/uL (ref 3.8–10.8)

## 2020-02-06 LAB — COMPLETE METABOLIC PANEL WITH GFR
AG Ratio: 1.7 (calc) (ref 1.0–2.5)
ALT: 15 U/L (ref 6–29)
AST: 14 U/L (ref 10–30)
Albumin: 4.4 g/dL (ref 3.6–5.1)
Alkaline phosphatase (APISO): 80 U/L (ref 31–125)
BUN: 10 mg/dL (ref 7–25)
CO2: 28 mmol/L (ref 20–32)
Calcium: 9.7 mg/dL (ref 8.6–10.2)
Chloride: 101 mmol/L (ref 98–110)
Creat: 0.63 mg/dL (ref 0.50–1.10)
GFR, Est African American: 128 mL/min/{1.73_m2} (ref >=60)
GFR, Est Non African American: 111 mL/min/{1.73_m2} (ref >=60)
Globulin: 2.6 g/dL (calc) (ref 1.9–3.7)
Glucose, Bld: 157 mg/dL — ABNORMAL HIGH (ref 65–99)
Potassium: 4.7 mmol/L (ref 3.5–5.3)
Sodium: 136 mmol/L (ref 135–146)
Total Bilirubin: 0.2 mg/dL (ref 0.2–1.2)
Total Protein: 7 g/dL (ref 6.1–8.1)

## 2020-02-06 NOTE — Progress Notes (Signed)
CBC and CMP are normal.  Glucose is elevated.  Please notify patient of elevated glucose and forward labs to her PCP.

## 2020-02-21 NOTE — Progress Notes (Signed)
Office Visit Note  Patient: Allison Atkinson             Date of Birth: [birth year]           MRN: [id number]             PCP: Carylon Perches, MD Referring: Carylon Perches, MD Visit Date: 03/05/2020 Occupation: @GUAROCC @  Subjective:  Right eye redness   History of Present Illness: Allison Atkinson is a 45 y.o. female with history of spondyloarthropathy, episcleritis of both eyes, and DDD. She is currently on cosentyx 300 mg sq injections every 28 days and MTX 0.8 ml sq injections once weekly. She was started on methotrexate after her last office visit and has performed 4 injections and is due for her 5th injection tomorrow.  She has been tolerating methotrexate without any side effects.  Methotrexate was added at her last office visit due to recurrent episcleritis flares.  According to the patient she develops signs and symptoms of a flare in her left eye starting in January 2021 through July 2021.  She states that her right eye started to flare in July 2021 and has continued since then.  She states for the past 1 month she has not had any pain in her right eye but it remains red.  She denies any photophobia or floaters.  She has not had any eye dryness.  She will be following up with Dr. August 2021 at the end of February 2022.  She has been experiencing some increased discomfort in her right knee joint.  She denies any joint swelling.  She has not had any recent injuries or falls.  She denies any SI joint discomfort at this time.  She has not had any hip or groin pain recently.  She denies any other joint pain or joint swelling.  She has not had any Achilles tendinitis or plantar fasciitis.  She denies any recent rashes. She denies any recent infections.   Activities of Daily Living:  Patient reports morning stiffness for 1.5 hours.   Patient Reports nocturnal pain.  Difficulty dressing/grooming: Denies Difficulty climbing stairs: Reports Difficulty getting out of chair: Denies Difficulty using  hands for taps, buttons, cutlery, and/or writing: Denies  Review of Systems  Constitutional: Positive for fatigue.  HENT: Negative for mouth sores, mouth dryness and nose dryness.   Eyes: Positive for pain and redness. Negative for itching, visual disturbance and dryness.  Respiratory: Negative for cough, hemoptysis, shortness of breath and difficulty breathing.   Cardiovascular: Negative for chest pain, palpitations and swelling in legs/feet.  Gastrointestinal: Negative for abdominal pain, blood in stool, constipation and diarrhea.  Endocrine: Negative for increased urination.  Genitourinary: Negative for painful urination.  Musculoskeletal: Positive for arthralgias, joint pain, morning stiffness and muscle tenderness. Negative for joint swelling, myalgias, muscle weakness and myalgias.  Skin: Negative for color change, rash and redness.  Allergic/Immunologic: Negative for susceptible to infections.  Neurological: Positive for headaches and memory loss. Negative for dizziness, numbness and weakness.  Hematological: Negative for swollen glands.  Psychiatric/Behavioral: Negative for confusion and sleep disturbance.    PMFS History:  Patient Active Problem List   Diagnosis Date Noted  . Obesity 07/04/2017  . High risk medication use 03/30/2016  . Acute midline low back pain without sciatica 03/30/2016  . Primary osteoarthritis of both knees 01/12/2016  . Spondylosis of lumbar region without myelopathy or radiculopathy 01/12/2016  . Sacroiliitis, not elsewhere classified (HCC) 01/12/2016  . Chronic diarrhea 01/12/2016  . History  of gastroesophageal reflux (GERD) 01/12/2016  . History of fatty infiltration of liver 01/12/2016  . Hypertension 05/09/2013  . Palpitations 04/11/2013  . Spondyloarthropathy (HCC) 10/24/2009  . PCOS (polycystic ovarian syndrome) 01/25/2003  . Depression with anxiety 01/25/2003  . GERD (gastroesophageal reflux disease) 01/25/2003  . Hypothyroidism 01/24/2001     Past Medical History:  Diagnosis Date  . Anxiety   . Bipolar disorder (HCC)   . Diabetes mellitus without complication (HCC) 05/2017  . Fatty liver disease, nonalcoholic   . History of cholecystectomy   . Hypertension   . Polycystic ovarian disease   . RA (rheumatoid arthritis) (HCC)   . Thyroid disease    hypothyroid    Family History  Problem Relation Age of Onset  . Hypertension Mother   . Alcohol abuse Father    Past Surgical History:  Procedure Laterality Date  . CHOLECYSTECTOMY  1999  . COLONOSCOPY W/ BIOPSIES  2010  . ESOPHAGOGASTRODUODENOSCOPY  2010  . 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 KNEE ARTHROPLASTY    . KNEE ARTHROSCOPY Right    Social History   Social History Narrative  . Not on file   Immunization History  Administered Date(s) Administered  . Influenza,inj,Quad PF,6+ Mos 10/19/2017  . Influenza-Unspecified 11/13/2012  . Moderna Sars-Covid-2 Vaccination 04/25/2019, 05/23/2019, 12/12/2019  . Pneumococcal Polysaccharide-23 10/24/2008     Objective: Vital Signs: BP (!) 133/96 (BP Location: Left Arm, Patient Position: Sitting, Cuff Size: Normal)   Pulse 82   Ht 5' 3.5" (1.613 m)   Wt 255 lb (115.7 kg)   BMI 44.46 kg/m    Physical Exam Vitals and nursing note reviewed.  Constitutional:      Appearance: She is well-developed and well-nourished.  HENT:     Head: Normocephalic and atraumatic.  Eyes:     Extraocular Movements: EOM normal.     Conjunctiva/sclera: Conjunctivae normal.  Cardiovascular:     Pulses: Intact distal pulses.  Pulmonary:     Effort: Pulmonary effort is normal.  Abdominal:     Palpations: Abdomen is soft.  Musculoskeletal:     Cervical back: Normal range of motion.  Skin:    General: Skin is warm and dry.     Capillary Refill: Capillary refill takes  less than 2 seconds.  Neurological:     Mental Status: She is alert and oriented to person, place, and time.  Psychiatric:        Mood and Affect: Mood and affect normal.        Behavior: Behavior normal.      Musculoskeletal Exam: C-spine, thoracic spine, and lumbar spine good ROM.  No midline spinal tenderness. No SI joint tenderness.  Shoulder joints, elbow joints, wrist joints, MCPs, PIPs, and DIPs good ROM with no synovitis.  Complete fist formation bilaterally.  Hip joints good ROM with no discomfort.  Knee joints good ROM with crepitus bilaterally.  No warmth or effusion of knee joints. Ankle joints good ROM with no tenderness or inflammation.  No tenderness along the achilles tendons.   CDAI Exam: CDAI Score: -- Patient Global: --; Provider Global: -- Swollen: --; Tender: -- Joint Exam 03/05/2020   No joint exam has been documented for this visit   There is currently no information documented on the homunculus. Go to the Rheumatology activity and complete the homunculus joint exam.  Investigation: No  additional findings.  Imaging: No results found.  Recent Labs: Lab Results  Component Value Date   WBC 10.3 02/05/2020   HGB 14.2 02/05/2020   PLT 422 (H) 02/05/2020   NA 136 02/05/2020   K 4.7 02/05/2020   CL 101 02/05/2020   CO2 28 02/05/2020   GLUCOSE 157 (H) 02/05/2020   BUN 10 02/05/2020   CREATININE 0.63 02/05/2020   BILITOT 0.2 02/05/2020   ALKPHOS 60 07/05/2017   AST 14 02/05/2020   ALT 15 02/05/2020   PROT 7.0 02/05/2020   ALBUMIN 3.9 07/05/2017   CALCIUM 9.7 02/05/2020   GFRAA 128 02/05/2020   QFTBGOLDPLUS NEGATIVE 07/23/2019    Speciality Comments: Patient was on Humira in the past.  Patient had an adequate response to Humira and was switched to Cosentyx.  Procedures:  No procedures performed Allergies: Risperdal [risperidone] and Latex   Assessment / Plan:     Visit Diagnoses: Spondyloarthropathy: She has not had any signs or symptoms of a  flare recently.  C-spine, thoracic spine, lumbar spine have good range of motion with no discomfort.  No midline spinal tenderness or SI joint tenderness was noted.  She happens no synovitis or dactylitis on exam.  She has not had any Achilles tendinitis or plantar fasciitis.  She is currently on Cosentyx 300 mg subcutaneous injections every 28 days, methotrexate 0.8 mL sq injections once weekly, and folic acid 2 mg by mouth daily.  She was started on methotrexate after her last office visit due to recurrent episcleritis flares.  She has been tolerating methotrexate without any side effects.  She has noticed less pain in her right eye but continues to have conjunctival erythema.  She has not been experiencing any photophobia, eye pain, floaters, or dryness for the past 1 month.  She is due to update lab work today.  She will continue on the current dose of Cosentyx and methotrexate.  She has an upcoming appointment with her ophthalmologist Dr. Fabian Sharp at the end of February, so she was advised to have him forward [place] office visit notes/further recommendations at that time.  She will follow-up in the office in 2 months to assess her full response to combination therapy.  High risk medication use - Cosentyx 300 mg sq injections every 28 days, methotrexate 0.8 ml sq injections once weekly, and folic acid 2 mg by mouth daily.  CBC and CMP updated on 02/05/20.  She is due to update lab work today.  Orders for CBC and CMP released.  Her next lab work will be due in 2 months.  Standing orders for CBC and CMP are in place. TB gold negative on 07/23/19 and will continue to be monitored yearly.   - Plan: COMPLETE METABOLIC PANEL WITH GFR, CBC with Differential/Platelet She has not had any recent infections.  Discussed the importance of holding Cosentyx and methotrexate if she develops signs or symptoms of an infection and to resume once infection has completely cleared.  She has received 3 Moderna covid-19 vaccine doses.   Discussed that the CDC and ACR recommend receiving a 4th dose 5 months after the 3rd. She was advised to hold methotrexate 1 week after receiving the fourth dose.  She voiced understanding.   Sacroiliitis (HCC): She has no SI joint tenderness to palpation on exam.    Episcleritis of both eyes - Dr. Fabian Sharp.  She started to have a flare of episcleritis in the left eye starting in January 2021 which resolved in July 2021.  She states after her left eye pain and inflammation resolved she started to have symptoms of a flare in her right eye which has persisted.  She was started on injectable methotrexate after her last office visit and has had 4 injections of methotrexate.  She has been tolerating methotrexate without any side effects.  She continues to have conjunctival erythema on exam but her eye pain has resolved.  She has not been experiencing any photophobia, eye pain, floaters, or dryness.  She will continue on methotrexate 0.8 mL sq injections once weekly.  She will be following up with Dr. Fabian Sharp at the end of the month for further evaluation.  She will follow-up in our office in 2 months to reassess her full response to injectable methotrexate  Trochanteric bursitis of both hips: She has tenderness to palpation over bilateral trochanteric bursa.  Encouraged to perform stretching exercises daily.   Pain in thoracic spine - Mild spondylosis of the thoracic spine. No kyphosis or midline spinal tenderness noted.   DDD (degenerative disc disease), lumbar: She has good ROM with no discomfort.  No midline spinal tenderness or SI joint tenderness.    Primary osteoarthritis of both knees: She has good ROM with no discomfort.  She has crepitus bilaterally.  No warmth or effusion of knee joints.  She has been experiencing some increased discomfort in the right knee joint over the past several weeks.  Her discomfort has been mild.  She has not had any recent injuries or falls.  She declined updated  x-rays or reapplying for Visco gel injections.  She declined a cortisone injection today.  She was given a handout of knee joint exercises to perform.  We discussed the importance of lower extremity muscle strengthening.  She was advised to notify us if her knee joint pain persists or worsens.  Other medical conditions are listed as follows:   Essential hypertension  History of gastroesophageal reflux (GERD)  History of fatty infiltration of liver  History of hypothyroidism  PCOS (polycystic ovarian syndrome)  Depression with anxiety  Orders: Orders Placed This Encounter  Procedures  . COMPLETE METABOLIC PANEL WITH GFR  . CBC with Differential/Platelet   No orders of the defined types were placed in this encounter.    Follow-Up Instructions: Return in about 2 months (around 05/03/2020) for Spondyloarthropathy.   Gearldine Bienenstock, PA-C  Note - This record has been created using Dragon software.  Chart creation errors have been sought, but may not always  have been located. Such creation errors do not reflect on  the standard of medical care.

## 2020-03-05 ENCOUNTER — Ambulatory Visit: Payer: PPO | Admitting: Physician Assistant

## 2020-03-05 ENCOUNTER — Other Ambulatory Visit: Payer: Self-pay

## 2020-03-05 ENCOUNTER — Encounter: Payer: Self-pay | Admitting: Physician Assistant

## 2020-03-05 VITALS — BP 133/96 | HR 82 | Ht 63.5 in | Wt 255.0 lb

## 2020-03-05 DIAGNOSIS — E282 Polycystic ovarian syndrome: Secondary | ICD-10-CM | POA: Diagnosis not present

## 2020-03-05 DIAGNOSIS — M461 Sacroiliitis, not elsewhere classified: Secondary | ICD-10-CM | POA: Diagnosis not present

## 2020-03-05 DIAGNOSIS — H15103 Unspecified episcleritis, bilateral: Secondary | ICD-10-CM

## 2020-03-05 DIAGNOSIS — M7061 Trochanteric bursitis, right hip: Secondary | ICD-10-CM

## 2020-03-05 DIAGNOSIS — M170 Bilateral primary osteoarthritis of knee: Secondary | ICD-10-CM | POA: Diagnosis not present

## 2020-03-05 DIAGNOSIS — Z8639 Personal history of other endocrine, nutritional and metabolic disease: Secondary | ICD-10-CM | POA: Diagnosis not present

## 2020-03-05 DIAGNOSIS — H15102 Unspecified episcleritis, left eye: Secondary | ICD-10-CM

## 2020-03-05 DIAGNOSIS — I10 Essential (primary) hypertension: Secondary | ICD-10-CM

## 2020-03-05 DIAGNOSIS — M47819 Spondylosis without myelopathy or radiculopathy, site unspecified: Secondary | ICD-10-CM

## 2020-03-05 DIAGNOSIS — M546 Pain in thoracic spine: Secondary | ICD-10-CM

## 2020-03-05 DIAGNOSIS — F418 Other specified anxiety disorders: Secondary | ICD-10-CM

## 2020-03-05 DIAGNOSIS — M7062 Trochanteric bursitis, left hip: Secondary | ICD-10-CM

## 2020-03-05 DIAGNOSIS — M5136 Other intervertebral disc degeneration, lumbar region: Secondary | ICD-10-CM

## 2020-03-05 DIAGNOSIS — Z8719 Personal history of other diseases of the digestive system: Secondary | ICD-10-CM | POA: Diagnosis not present

## 2020-03-05 DIAGNOSIS — Z79899 Other long term (current) drug therapy: Secondary | ICD-10-CM | POA: Diagnosis not present

## 2020-03-05 NOTE — Patient Instructions (Signed)
Standing Labs We placed an order today for your standing lab work.   Please have your standing labs drawn in April and every 3 months   If possible, please have your labs drawn 2 weeks prior to your appointment so that the provider can discuss your results at your appointment.  We have open lab daily Monday through Thursday from 1:30-4:30 PM and Friday from 1:30-4:00 PM at the office of Dr. [name], [facility].   Please be advised, all patients with office appointments requiring lab work will take precedents over walk-in lab work.  If possible, please come for your lab work on Monday and Friday afternoons, as you may experience shorter wait times. The office is located at [address], [address], [place], [place] [address] No appointment is necessary.   Labs are drawn by [facility]. Please bring your co-pay at the time of your lab draw.  You may receive a bill from [facility] for your lab work.  If you wish to have your labs drawn at another location, please call the office 24 hours in advance to send orders.  If you have any questions regarding directions or hours of operation,  please call [phone].   As a reminder, please drink plenty of water prior to coming for your lab work. Thank you    Journal for Nurse Practitioners, 15(4), [phone]. Retrieved October 30, 2017 from http://clinicalkey.com/nursing">  Knee Exercises Ask your health care provider which exercises are safe for you. Do exercises exactly as told by your health care provider and adjust them as directed. It is normal to feel mild stretching, pulling, tightness, or discomfort as you do these exercises. Stop right away if you feel sudden pain or your pain gets worse. Do not begin these exercises until told by your health care provider. Stretching and range-of-motion exercises These exercises warm up your muscles and joints and improve the movement and flexibility of your knee. These exercises also help to  relieve pain and swelling. Knee extension, prone 1. Lie on your abdomen (prone position) on a bed. 2. Place your left / right knee just beyond the edge of the surface so your knee is not on the bed. You can put a towel under your left / right thigh just above your kneecap for comfort. 3. Relax your leg muscles and allow gravity to straighten your knee (extension). You should feel a stretch behind your left / right knee. 4. Hold this position for __________ seconds. 5. Scoot up so your knee is supported between repetitions. Repeat __________ times. Complete this exercise __________ times a day. Knee flexion, active 1. Lie on your back with both legs straight. If this causes back discomfort, bend your left / right knee so your foot is flat on the floor. 2. Slowly slide your left / right heel back toward your buttocks. Stop when you feel a gentle stretch in the front of your knee or thigh (flexion). 3. Hold this position for __________ seconds. 4. Slowly slide your left / right heel back to the starting position. Repeat __________ times. Complete this exercise __________ times a day.   Quadriceps stretch, prone 1. Lie on your abdomen on a firm surface, such as a bed or padded floor. 2. Bend your left / right knee and hold your ankle. If you cannot reach your ankle or pant leg, loop a belt around your foot and grab the belt instead. 3. Gently pull your heel toward your buttocks. Your knee should not slide out to  the side. You should feel a stretch in the front of your thigh and knee (quadriceps). 4. Hold this position for __________ seconds. Repeat __________ times. Complete this exercise __________ times a day.   Hamstring, supine 1. Lie on your back (supine position). 2. Loop a belt or towel over the ball of your left / right foot. The ball of your foot is on the walking surface, right under your toes. 3. Straighten your left / right knee and slowly pull on the belt to raise your leg until you  feel a gentle stretch behind your knee (hamstring). ? Do not let your knee bend while you do this. ? Keep your other leg flat on the floor. 4. Hold this position for __________ seconds. Repeat __________ times. Complete this exercise __________ times a day. Strengthening exercises These exercises build strength and endurance in your knee. Endurance is the ability to use your muscles for a long time, even after they get tired. Quadriceps, isometric This exercise stretches the muscles in front of your thigh (quadriceps) without moving your knee joint (isometric). 1. Lie on your back with your left / right leg extended and your other knee bent. Put a rolled towel or small pillow under your knee if told by your health care provider. 2. Slowly tense the muscles in the front of your left / right thigh. You should see your kneecap slide up toward your hip or see increased dimpling just above the knee. This motion will push the back of the knee toward the floor. 3. For __________ seconds, hold the muscle as tight as you can without increasing your pain. 4. Relax the muscles slowly and completely. Repeat __________ times. Complete this exercise __________ times a day.   Straight leg raises This exercise stretches the muscles in front of your thigh (quadriceps) and the muscles that move your hips (hip flexors). 1. Lie on your back with your left / right leg extended and your other knee bent. 2. Tense the muscles in the front of your left / right thigh. You should see your kneecap slide up or see increased dimpling just above the knee. Your thigh may even shake a bit. 3. Keep these muscles tight as you raise your leg 4-6 inches (10-15 cm) off the floor. Do not let your knee bend. 4. Hold this position for __________ seconds. 5. Keep these muscles tense as you lower your leg. 6. Relax your muscles slowly and completely after each repetition. Repeat __________ times. Complete this exercise __________ times  a day. Hamstring, isometric 1. Lie on your back on a firm surface. 2. Bend your left / right knee about __________ degrees. 3. Dig your left / right heel into the surface as if you are trying to pull it toward your buttocks. Tighten the muscles in the back of your thighs (hamstring) to "dig" as hard as you can without increasing any pain. 4. Hold this position for __________ seconds. 5. Release the tension gradually and allow your muscles to relax completely for __________ seconds after each repetition. Repeat __________ times. Complete this exercise __________ times a day. Hamstring curls If told by your health care provider, do this exercise while wearing ankle weights. Begin with __________ lb weights. Then increase the weight by 1 lb (0.5 kg) increments. Do not wear ankle weights that are more than __________ lb. 1. Lie on your abdomen with your legs straight. 2. Bend your left / right knee as far as you can without feeling pain. Keep your  hips flat against the floor. 3. Hold this position for __________ seconds. 4. Slowly lower your leg to the starting position. Repeat __________ times. Complete this exercise __________ times a day.   Squats This exercise strengthens the muscles in front of your thigh and knee (quadriceps). 1. Stand in front of a table, with your feet and knees pointing straight ahead. You may rest your hands on the table for balance but not for support. 2. Slowly bend your knees and lower your hips like you are going to sit in a chair. ? Keep your weight over your heels, not over your toes. ? Keep your lower legs upright so they are parallel with the table legs. ? Do not let your hips go lower than your knees. ? Do not bend lower than told by your health care provider. ? If your knee pain increases, do not bend as low. 3. Hold the squat position for __________ seconds. 4. Slowly push with your legs to return to standing. Do not use your hands to pull yourself to  standing. Repeat __________ times. Complete this exercise __________ times a day. Wall slides This exercise strengthens the muscles in front of your thigh and knee (quadriceps). 1. Lean your back against a smooth wall or door, and walk your feet out 18-24 inches (46-61 cm) from it. 2. Place your feet hip-width apart. 3. Slowly slide down the wall or door until your knees bend __________ degrees. Keep your knees over your heels, not over your toes. Keep your knees in line with your hips. 4. Hold this position for __________ seconds. Repeat __________ times. Complete this exercise __________ times a day.   Straight leg raises This exercise strengthens the muscles that rotate the leg at the hip and move it away from your body (hip abductors). 1. Lie on your side with your left / right leg in the top position. Lie so your head, shoulder, knee, and hip line up. You may bend your bottom knee to help you keep your balance. 2. Roll your hips slightly forward so your hips are stacked directly over each other and your left / right knee is facing forward. 3. Leading with your heel, lift your top leg 4-6 inches (10-15 cm). You should feel the muscles in your outer hip lifting. ? Do not let your foot drift forward. ? Do not let your knee roll toward the ceiling. 4. Hold this position for __________ seconds. 5. Slowly return your leg to the starting position. 6. Let your muscles relax completely after each repetition. Repeat __________ times. Complete this exercise __________ times a day.   Straight leg raises This exercise stretches the muscles that move your hips away from the front of the pelvis (hip extensors). 1. Lie on your abdomen on a firm surface. You can put a pillow under your hips if that is more comfortable. 2. Tense the muscles in your buttocks and lift your left / right leg about 4-6 inches (10-15 cm). Keep your knee straight as you lift your leg. 3. Hold this position for __________  seconds. 4. Slowly lower your leg to the starting position. 5. Let your leg relax completely after each repetition. Repeat __________ times. Complete this exercise __________ times a day. This information is not intended to replace advice given to you by your health care provider. Make sure you discuss any questions you have with your health care provider. Document Revised: 10/31/2017 Document Reviewed: 10/31/2017 Elsevier Patient Education  2021 [employer].

## 2020-03-06 ENCOUNTER — Other Ambulatory Visit: Payer: Self-pay | Admitting: *Deleted

## 2020-03-06 LAB — CBC WITH DIFFERENTIAL/PLATELET
Absolute Monocytes: 856 cells/uL (ref 200–950)
Basophils Absolute: 92 cells/uL (ref 0–200)
Basophils Relative: 1 %
Eosinophils Absolute: 276 cells/uL (ref 15–500)
Eosinophils Relative: 3 %
HCT: 41.7 % (ref 35.0–45.0)
Hemoglobin: 14 g/dL (ref 11.7–15.5)
Lymphs Abs: 3275 cells/uL (ref 850–3900)
MCH: 30.9 pg (ref 27.0–33.0)
MCHC: 33.6 g/dL (ref 32.0–36.0)
MCV: 92.1 fL (ref 80.0–100.0)
MPV: 9.8 fL (ref 7.5–12.5)
Monocytes Relative: 9.3 %
Neutro Abs: 4701 cells/uL (ref 1500–7800)
Neutrophils Relative %: 51.1 %
Platelets: 432 10*3/uL — ABNORMAL HIGH (ref 140–400)
RBC: 4.53 10*6/uL (ref 3.80–5.10)
RDW: 13.2 % (ref 11.0–15.0)
Total Lymphocyte: 35.6 %
WBC: 9.2 10*3/uL (ref 3.8–10.8)

## 2020-03-06 LAB — COMPLETE METABOLIC PANEL WITH GFR
AG Ratio: 1.4 (calc) (ref 1.0–2.5)
ALT: 34 U/L — ABNORMAL HIGH (ref 6–29)
AST: 19 U/L (ref 10–30)
Albumin: 4.2 g/dL (ref 3.6–5.1)
Alkaline phosphatase (APISO): 88 U/L (ref 31–125)
BUN: 10 mg/dL (ref 7–25)
CO2: 28 mmol/L (ref 20–32)
Calcium: 9.8 mg/dL (ref 8.6–10.2)
Chloride: 104 mmol/L (ref 98–110)
Creat: 0.62 mg/dL (ref 0.50–1.10)
GFR, Est African American: 129 mL/min/{1.73_m2} (ref >=60)
GFR, Est Non African American: 111 mL/min/{1.73_m2} (ref >=60)
Globulin: 2.9 g/dL (calc) (ref 1.9–3.7)
Glucose, Bld: 105 mg/dL — ABNORMAL HIGH (ref 65–99)
Potassium: 4.7 mmol/L (ref 3.5–5.3)
Sodium: 139 mmol/L (ref 135–146)
Total Bilirubin: 0.3 mg/dL (ref 0.2–1.2)
Total Protein: 7.1 g/dL (ref 6.1–8.1)

## 2020-03-06 NOTE — Telephone Encounter (Signed)
Last Visit: 03/05/2020 Next Visit: 05/01/2020 Labs: 03/05/2020 ALT is borderline elevated-34. AST WNL. Platelet count remains elevated.  Current Dose per office note 03/05/2020: methotrexate 0.8 mL sq injections once weekly DX: Spondyloarthropathy  Last Fill: 01/30/2020  Okay to refill MTX?

## 2020-03-06 NOTE — Progress Notes (Signed)
ALT is borderline elevated-34. AST WNL.  Platelet count remains elevated.  We will obtain updated lab work at there upcoming follow up visit. Reviewed lab work with Dr. Corliss Skains.   She is ok with the patient continuing on methotrexate 0.8 ml sq injections once weekly.  Please advise the patient to avoid taking tylenol, NSAIDs, and alcohol use.

## 2020-03-06 NOTE — Telephone Encounter (Signed)
-----   Message from Gearldine Bienenstock, PA-C sent at 03/06/2020  2:58 PM EST ----- ALT is borderline elevated-34. AST WNL.  Platelet count remains elevated.  We will obtain updated lab work at there upcoming follow up visit. Reviewed lab work with Dr. Corliss Skains.   She is ok with the patient continuing on methotrexate 0.8 ml sq injections once weekly.  Please advise the patient to avoid taking tylenol, NSAIDs, and alcohol use.

## 2020-03-09 MED ORDER — METHOTREXATE SODIUM CHEMO INJECTION 50 MG/2ML: 20.0000 mg | INTRAMUSCULAR | 0 refills | Status: DC

## 2020-03-12 DIAGNOSIS — F3161 Bipolar disorder, current episode mixed, mild: Secondary | ICD-10-CM | POA: Diagnosis not present

## 2020-03-12 DIAGNOSIS — G4730 Sleep apnea, unspecified: Secondary | ICD-10-CM | POA: Diagnosis not present

## 2020-03-19 DIAGNOSIS — E1165 Type 2 diabetes mellitus with hyperglycemia: Secondary | ICD-10-CM | POA: Diagnosis not present

## 2020-03-19 DIAGNOSIS — E559 Vitamin D deficiency, unspecified: Secondary | ICD-10-CM | POA: Diagnosis not present

## 2020-03-19 DIAGNOSIS — Z9884 Bariatric surgery status: Secondary | ICD-10-CM | POA: Diagnosis not present

## 2020-03-19 DIAGNOSIS — E039 Hypothyroidism, unspecified: Secondary | ICD-10-CM | POA: Diagnosis not present

## 2020-03-19 DIAGNOSIS — E782 Mixed hyperlipidemia: Secondary | ICD-10-CM | POA: Diagnosis not present

## 2020-04-14 ENCOUNTER — Other Ambulatory Visit: Payer: Self-pay | Admitting: *Deleted

## 2020-04-14 MED ORDER — COSENTYX SENSOREADY (300 MG) 150 MG/ML ~~LOC~~ SOAJ: 300.0000 mg | SUBCUTANEOUS | 0 refills | Status: DC

## 2020-04-14 NOTE — Telephone Encounter (Signed)
RF faxed from [place] by [employer]  Next Visit: 05/01/2020  Last Visit: 03/05/2020  Last Fill: 01/14/2020  DX: Spondyloarthropathy  Current Dose per office note 03/05/2020, Cosentyx 300 mg sq injections every 28 days  Labs: 03/05/2020, ALT is borderline elevated-34. AST WNL. Platelet count remains elevated. We will obtain updated lab work at there upcoming follow up visit. Reviewed lab work with Dr. Corliss Skains.  She is ok with the patient continuing on methotrexate 0.8 ml sq injections once weekly. Please advise the patient to avoid taking tylenol, NSAIDs, and alcohol use.   TB Gold: 07/23/2019  Okay to refill Cosentyx?

## 2020-04-16 NOTE — Telephone Encounter (Signed)
Received a fax from  [employer] regarding an approval for COSENTYX patient assistance through 01/23/21.   Phone number: [phone]  Chesley Mires, PharmD, MPH Clinical Pharmacist (Rheumatology and Pulmonology)

## 2020-04-17 NOTE — Progress Notes (Deleted)
Office Visit Note  Patient: Allison Atkinson             Date of Birth: [birth year]           MRN: [id number]             PCP: Carylon Perches, MD Referring: Carylon Perches, MD Visit Date: 05/01/2020 Occupation: @GUAROCC @  Subjective:  No chief complaint on file.   History of Present Illness: ROSAISELA Atkinson is a 45 y.o. female ***   Activities of Daily Living:  Patient reports morning stiffness for *** {minute/hour:19697}.   Patient {ACTIONS;DENIES/REPORTS:21021675::"Denies"} nocturnal pain.  Difficulty dressing/grooming: {ACTIONS;DENIES/REPORTS:21021675::"Denies"} Difficulty climbing stairs: {ACTIONS;DENIES/REPORTS:21021675::"Denies"} Difficulty getting out of chair: {ACTIONS;DENIES/REPORTS:21021675::"Denies"} Difficulty using hands for taps, buttons, cutlery, and/or writing: {ACTIONS;DENIES/REPORTS:21021675::"Denies"}  No Rheumatology ROS completed.   PMFS History:  Patient Active Problem List   Diagnosis Date Noted  . Obesity 07/04/2017  . High risk medication use 03/30/2016  . Acute midline low back pain without sciatica 03/30/2016  . Primary osteoarthritis of both knees 01/12/2016  . Spondylosis of lumbar region without myelopathy or radiculopathy 01/12/2016  . Sacroiliitis, not elsewhere classified (HCC) 01/12/2016  . Chronic diarrhea 01/12/2016  . History of gastroesophageal reflux (GERD) 01/12/2016  . History of fatty infiltration of liver 01/12/2016  . Hypertension 05/09/2013  . Palpitations 04/11/2013  . Spondyloarthropathy (HCC) 10/24/2009  . PCOS (polycystic ovarian syndrome) 01/25/2003  . Depression with anxiety 01/25/2003  . GERD (gastroesophageal reflux disease) 01/25/2003  . Hypothyroidism 01/24/2001    Past Medical History:  Diagnosis Date  . Anxiety   . Bipolar disorder (HCC)   . Diabetes mellitus without complication (HCC) 05/2017  . Fatty liver disease, nonalcoholic   . History of cholecystectomy   . Hypertension   . Polycystic ovarian disease   .  RA (rheumatoid arthritis) (HCC)   . Thyroid disease    hypothyroid    Family History  Problem Relation Age of Onset  . Hypertension Mother   . Alcohol abuse Father    Past Surgical History:  Procedure Laterality Date  . CHOLECYSTECTOMY  1999  . COLONOSCOPY W/ BIOPSIES  2010  . ESOPHAGOGASTRODUODENOSCOPY  2010  . 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 KNEE ARTHROPLASTY    . KNEE ARTHROSCOPY Right    Social History   Social History Narrative  . Not on file   Immunization History  Administered Date(s) Administered  . Influenza,inj,Quad PF,6+ Mos 10/19/2017  . Influenza-Unspecified 11/13/2012  . Moderna Sars-Covid-2 Vaccination 04/25/2019, 05/23/2019, 12/12/2019  . Pneumococcal Polysaccharide-23 10/24/2008     Objective: Vital Signs: There were no vitals taken for this visit.   Physical Exam   Musculoskeletal Exam: ***  CDAI Exam: CDAI Score: -- Patient Global: --; Provider Global: -- Swollen: --; Tender: -- Joint Exam 05/01/2020   No joint exam has been documented for this visit   There is currently no information documented on the homunculus. Go to the Rheumatology activity and complete the homunculus joint exam.  Investigation: No additional findings.  Imaging: No results found.  Recent Labs: Lab Results  Component Value Date   WBC 9.2 03/05/2020   HGB 14.0 03/05/2020   PLT 432 (H) 03/05/2020   NA 139 03/05/2020   K 4.7 03/05/2020   CL 104 03/05/2020   CO2 28 03/05/2020   GLUCOSE 105 (H) 03/05/2020   BUN 10 03/05/2020  CREATININE 0.62 03/05/2020   BILITOT 0.3 03/05/2020   ALKPHOS 60 07/05/2017   AST 19 03/05/2020   ALT 34 (H) 03/05/2020   PROT 7.1 03/05/2020   ALBUMIN 3.9 07/05/2017   CALCIUM 9.8 03/05/2020   GFRAA 129 03/05/2020   QFTBGOLDPLUS NEGATIVE 07/23/2019    Speciality  Comments: Patient was on Humira in the past.  Patient had an adequate response to Humira and was switched to Cosentyx.  Procedures:  No procedures performed Allergies: Risperdal [risperidone] and Latex   Assessment / Plan:     Visit Diagnoses: Spondyloarthropathy  High risk medication use - Cosentyx 300 mg subcutaneous injections every 28 days, methotrexate 0.8 mL sq injections once weekly (added due to epis flarescleritis, folic acid 2 mg daily.  Sacroiliitis (HCC)  Episcleritis of both eyes - Dr. Dione Booze  Trochanteric bursitis of both hips  Pain in thoracic spine  DDD (degenerative disc disease), lumbar  Primary osteoarthritis of both knees  Essential hypertension  History of gastroesophageal reflux (GERD)  History of fatty infiltration of liver  History of hypothyroidism  PCOS (polycystic ovarian syndrome)  Depression with anxiety  Orders: No orders of the defined types were placed in this encounter.  No orders of the defined types were placed in this encounter.   Face-to-face time spent with patient was *** minutes. Greater than 50% of time was spent in counseling and coordination of care.  Follow-Up Instructions: No follow-ups on file.   Gearldine Bienenstock, PA-C  Note - This record has been created using Dragon software.  Chart creation errors have been sought, but may not always  have been located. Such creation errors do not reflect on  the standard of medical care.

## 2020-04-22 ENCOUNTER — Other Ambulatory Visit: Payer: Self-pay | Admitting: Physician Assistant

## 2020-04-23 NOTE — Telephone Encounter (Signed)
Next Visit: 05/01/2020  Last Visit: 03/05/2020  Last Fill: 03/09/2020  DX: Spondyloarthropathy  Current Dose per office note 03/05/2020, methotrexate 0.8 ml sq injections once weekly  Labs: 03/05/2020, ALT is borderline elevated-34. AST WNL. Platelet count remains elevated. We will obtain updated lab work at there upcoming follow up visit. Reviewed lab work with Dr. Corliss Skains.  She is ok with the patient continuing on methotrexate 0.8 ml sq injections once weekly. Please advise the patient to avoid taking tylenol, NSAIDs, and alcohol use.   Okay to refill MTX?

## 2020-05-01 ENCOUNTER — Ambulatory Visit: Payer: PPO | Admitting: Physician Assistant

## 2020-05-01 DIAGNOSIS — Z8719 Personal history of other diseases of the digestive system: Secondary | ICD-10-CM

## 2020-05-01 DIAGNOSIS — Z8639 Personal history of other endocrine, nutritional and metabolic disease: Secondary | ICD-10-CM

## 2020-05-01 DIAGNOSIS — M47819 Spondylosis without myelopathy or radiculopathy, site unspecified: Secondary | ICD-10-CM

## 2020-05-01 DIAGNOSIS — Z79899 Other long term (current) drug therapy: Secondary | ICD-10-CM

## 2020-05-01 DIAGNOSIS — M7062 Trochanteric bursitis, left hip: Secondary | ICD-10-CM

## 2020-05-01 DIAGNOSIS — M5136 Other intervertebral disc degeneration, lumbar region: Secondary | ICD-10-CM

## 2020-05-01 DIAGNOSIS — M170 Bilateral primary osteoarthritis of knee: Secondary | ICD-10-CM

## 2020-05-01 DIAGNOSIS — M546 Pain in thoracic spine: Secondary | ICD-10-CM

## 2020-05-01 DIAGNOSIS — E282 Polycystic ovarian syndrome: Secondary | ICD-10-CM

## 2020-05-01 DIAGNOSIS — M461 Sacroiliitis, not elsewhere classified: Secondary | ICD-10-CM

## 2020-05-01 DIAGNOSIS — I10 Essential (primary) hypertension: Secondary | ICD-10-CM

## 2020-05-01 DIAGNOSIS — F418 Other specified anxiety disorders: Secondary | ICD-10-CM

## 2020-05-01 DIAGNOSIS — H15103 Unspecified episcleritis, bilateral: Secondary | ICD-10-CM

## 2020-05-01 NOTE — Progress Notes (Deleted)
Office Visit Note  Patient: Allison Atkinson             Date of Birth: 21-Jul-1975           MRN: [id number]             PCP: Carylon Perches, MD Referring: Carylon Perches, MD Visit Date: 05/13/2020 Occupation: @GUAROCC @  Subjective:    History of Present Illness: Allison Atkinson is a 45 y.o. female with history of spondyloarthropathy.  She is on cosentyx 300 mg sq injections once every 28 days, methotrexate 0.8 ml sq injections once weekly, and folic acid 2 mg by mouth daily.    CBC and CMP updated on 03/19/20.  She is due to update lab work in May and every 3 months.  Standing orders for CBC and CMP are in place. TB gold negative on 07/23/19.   Activities of Daily Living:  Patient reports morning stiffness for *** {minute/hour:19697}.   Patient {ACTIONS;DENIES/REPORTS:21021675::"Denies"} nocturnal pain.  Difficulty dressing/grooming: {ACTIONS;DENIES/REPORTS:21021675::"Denies"} Difficulty climbing stairs: {ACTIONS;DENIES/REPORTS:21021675::"Denies"} Difficulty getting out of chair: {ACTIONS;DENIES/REPORTS:21021675::"Denies"} Difficulty using hands for taps, buttons, cutlery, and/or writing: {ACTIONS;DENIES/REPORTS:21021675::"Denies"}  No Rheumatology ROS completed.   PMFS History:  Patient Active Problem List   Diagnosis Date Noted  . Obesity 07/04/2017  . High risk medication use 03/30/2016  . Acute midline low back pain without sciatica 03/30/2016  . Primary osteoarthritis of both knees 01/12/2016  . Spondylosis of lumbar region without myelopathy or radiculopathy 01/12/2016  . Sacroiliitis, not elsewhere classified (HCC) 01/12/2016  . Chronic diarrhea 01/12/2016  . History of gastroesophageal reflux (GERD) 01/12/2016  . History of fatty infiltration of liver 01/12/2016  . Hypertension 05/09/2013  . Palpitations 04/11/2013  . Spondyloarthropathy (HCC) 10/24/2009  . PCOS (polycystic ovarian syndrome) 01/25/2003  . Depression with anxiety 01/25/2003  . GERD (gastroesophageal  reflux disease) 01/25/2003  . Hypothyroidism 01/24/2001    Past Medical History:  Diagnosis Date  . Anxiety   . Bipolar disorder (HCC)   . Diabetes mellitus without complication (HCC) 05/2017  . Fatty liver disease, nonalcoholic   . History of cholecystectomy   . Hypertension   . Polycystic ovarian disease   . RA (rheumatoid arthritis) (HCC)   . Thyroid disease    hypothyroid    Family History  Problem Relation Age of Onset  . Hypertension Mother   . Alcohol abuse Father    Past Surgical History:  Procedure Laterality Date  . CHOLECYSTECTOMY  1999  . COLONOSCOPY W/ BIOPSIES  2010  . ESOPHAGOGASTRODUODENOSCOPY  2010  . 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 KNEE ARTHROPLASTY    . KNEE ARTHROSCOPY Right    Social History   Social History Narrative  . Not on file   Immunization History  Administered Date(s) Administered  . Influenza,inj,Quad PF,6+ Mos 10/19/2017  . Influenza-Unspecified 11/13/2012  . Moderna Sars-Covid-2 Vaccination 04/25/2019, 05/23/2019, 12/12/2019  . Pneumococcal Polysaccharide-23 10/24/2008     Objective: Vital Signs: There were no vitals taken for this visit.   Physical Exam   Musculoskeletal Exam: ***  CDAI Exam: CDAI Score: -- Patient Global: --; Provider Global: -- Swollen: --; Tender: -- Joint Exam 05/13/2020   No joint exam has been documented for this visit   There is currently no information documented on the homunculus. Go to the Rheumatology activity and complete the homunculus joint exam.  Investigation:  No additional findings.  Imaging: No results found.  Recent Labs: Lab Results  Component Value Date   WBC 9.2 03/05/2020   HGB 14.0 03/05/2020   PLT 432 (H) 03/05/2020   NA 139 03/05/2020   K 4.7 03/05/2020   CL 104 03/05/2020   CO2 28 03/05/2020    GLUCOSE 105 (H) 03/05/2020   BUN 10 03/05/2020   CREATININE 0.62 03/05/2020   BILITOT 0.3 03/05/2020   ALKPHOS 60 07/05/2017   AST 19 03/05/2020   ALT 34 (H) 03/05/2020   PROT 7.1 03/05/2020   ALBUMIN 3.9 07/05/2017   CALCIUM 9.8 03/05/2020   GFRAA 129 03/05/2020   QFTBGOLDPLUS NEGATIVE 07/23/2019    Speciality Comments: Patient was on Humira in the past.  Patient had an adequate response to Humira and was switched to Cosentyx.  Procedures:  No procedures performed Allergies: Risperdal [risperidone] and Latex   Assessment / Plan:     Visit Diagnoses: No diagnosis found.  Orders: No orders of the defined types were placed in this encounter.  No orders of the defined types were placed in this encounter.   Face-to-face time spent with patient was *** minutes. Greater than 50% of time was spent in counseling and coordination of care.  Follow-Up Instructions: No follow-ups on file.   Ellen Henri, CMA  Note - This record has been created using Animal nutritionist.  Chart creation errors have been sought, but may not always  have been located. Such creation errors do not reflect on  the standard of medical care.

## 2020-05-13 ENCOUNTER — Ambulatory Visit: Payer: PPO | Admitting: Physician Assistant

## 2020-05-13 DIAGNOSIS — F418 Other specified anxiety disorders: Secondary | ICD-10-CM

## 2020-05-13 DIAGNOSIS — M170 Bilateral primary osteoarthritis of knee: Secondary | ICD-10-CM

## 2020-05-13 DIAGNOSIS — I10 Essential (primary) hypertension: Secondary | ICD-10-CM

## 2020-05-13 DIAGNOSIS — M5136 Other intervertebral disc degeneration, lumbar region: Secondary | ICD-10-CM

## 2020-05-13 DIAGNOSIS — Z79899 Other long term (current) drug therapy: Secondary | ICD-10-CM

## 2020-05-13 DIAGNOSIS — Z8719 Personal history of other diseases of the digestive system: Secondary | ICD-10-CM

## 2020-05-13 DIAGNOSIS — M546 Pain in thoracic spine: Secondary | ICD-10-CM

## 2020-05-13 DIAGNOSIS — M47819 Spondylosis without myelopathy or radiculopathy, site unspecified: Secondary | ICD-10-CM

## 2020-05-13 DIAGNOSIS — E282 Polycystic ovarian syndrome: Secondary | ICD-10-CM

## 2020-05-13 DIAGNOSIS — M7061 Trochanteric bursitis, right hip: Secondary | ICD-10-CM

## 2020-05-13 DIAGNOSIS — Z8639 Personal history of other endocrine, nutritional and metabolic disease: Secondary | ICD-10-CM

## 2020-05-13 DIAGNOSIS — H15103 Unspecified episcleritis, bilateral: Secondary | ICD-10-CM

## 2020-05-13 DIAGNOSIS — M461 Sacroiliitis, not elsewhere classified: Secondary | ICD-10-CM

## 2020-05-13 DIAGNOSIS — Z111 Encounter for screening for respiratory tuberculosis: Secondary | ICD-10-CM

## 2020-06-30 ENCOUNTER — Other Ambulatory Visit: Payer: Self-pay | Admitting: Internal Medicine

## 2020-06-30 ENCOUNTER — Ambulatory Visit (INDEPENDENT_AMBULATORY_CARE_PROVIDER_SITE_OTHER): Payer: PPO

## 2020-06-30 ENCOUNTER — Telehealth: Payer: Self-pay

## 2020-06-30 DIAGNOSIS — R000 Tachycardia, unspecified: Secondary | ICD-10-CM

## 2020-06-30 DIAGNOSIS — R55 Syncope and collapse: Secondary | ICD-10-CM

## 2020-06-30 NOTE — Telephone Encounter (Signed)
Received fax order for 7 day Zio from Dr.Fagan for tachycardia and near-syncope.  Enrolled patient, monitor will be mailed to patient's address

## 2020-07-05 DIAGNOSIS — R55 Syncope and collapse: Secondary | ICD-10-CM

## 2020-07-05 DIAGNOSIS — R000 Tachycardia, unspecified: Secondary | ICD-10-CM

## 2020-07-20 DIAGNOSIS — R000 Tachycardia, unspecified: Secondary | ICD-10-CM | POA: Diagnosis not present

## 2020-07-20 DIAGNOSIS — R55 Syncope and collapse: Secondary | ICD-10-CM | POA: Diagnosis not present

## 2020-07-24 ENCOUNTER — Other Ambulatory Visit (HOSPITAL_COMMUNITY): Payer: Self-pay | Admitting: Internal Medicine

## 2020-07-24 DIAGNOSIS — R000 Tachycardia, unspecified: Secondary | ICD-10-CM

## 2020-08-06 ENCOUNTER — Ambulatory Visit (HOSPITAL_COMMUNITY): Admission: RE | Admit: 2020-08-06 | Payer: PPO | Source: Ambulatory Visit

## 2020-08-18 NOTE — Progress Notes (Deleted)
Office Visit Note  Patient: Allison Atkinson             Date of Birth: [birth year]           MRN: [id number]             PCP: Carylon Perches, MD Referring: Carylon Perches, MD Visit Date: 08/21/2020 Occupation: @GUAROCC @  Subjective:    History of Present Illness: Allison Atkinson is a 45 y.o. female with history of spondyloarthropathy and osteoarthritis.  She is on Cosentyx 300 mg subcutaneous injections once monthly, methotrexate 0.8 mL sq injections once weekly, and folic acid 2 mg by mouth daily.  CBC and CMP were drawn on 03/19/2020.  She is overdue to update lab work.  Orders for CBC and CMP were released.  Her next lab work will be due in October and every 3 months to monitor for drug toxicity.  Standing orders for CBC and CMP remain in place.  Activities of Daily Living:  Patient reports morning stiffness for *** {minute/hour:19697}.   Patient {ACTIONS;DENIES/REPORTS:21021675::"Denies"} nocturnal pain.  Difficulty dressing/grooming: {ACTIONS;DENIES/REPORTS:21021675::"Denies"} Difficulty climbing stairs: {ACTIONS;DENIES/REPORTS:21021675::"Denies"} Difficulty getting out of chair: {ACTIONS;DENIES/REPORTS:21021675::"Denies"} Difficulty using hands for taps, buttons, cutlery, and/or writing: {ACTIONS;DENIES/REPORTS:21021675::"Denies"}  No Rheumatology ROS completed.   PMFS History:  Patient Active Problem List   Diagnosis Date Noted   Obesity 07/04/2017   High risk medication use 03/30/2016   Acute midline low back pain without sciatica 03/30/2016   Primary osteoarthritis of both knees 01/12/2016   Spondylosis of lumbar region without myelopathy or radiculopathy 01/12/2016   Sacroiliitis, not elsewhere classified (HCC) 01/12/2016   Chronic diarrhea 01/12/2016   History of gastroesophageal reflux (GERD) 01/12/2016   History of fatty infiltration of liver 01/12/2016   Hypertension 05/09/2013   Palpitations 04/11/2013   Spondyloarthropathy (HCC) 10/24/2009   PCOS (polycystic  ovarian syndrome) 01/25/2003   Depression with anxiety 01/25/2003   GERD (gastroesophageal reflux disease) 01/25/2003   Hypothyroidism 01/24/2001    Past Medical History:  Diagnosis Date   Anxiety    Bipolar disorder (HCC)    Diabetes mellitus without complication (HCC) 05/2017   Fatty liver disease, nonalcoholic    History of cholecystectomy    Hypertension    Polycystic ovarian disease    RA (rheumatoid arthritis) (HCC)    Thyroid disease    hypothyroid    Family History  Problem Relation Age of Onset   Hypertension Mother    Alcohol abuse Father    Past Surgical History:  Procedure Laterality Date   CHOLECYSTECTOMY  1999   COLONOSCOPY W/ BIOPSIES  2010   ESOPHAGOGASTRODUODENOSCOPY  2010   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   Physical Exam Vitals and nursing note reviewed.  Constitutional:      Appearance: She is well-developed.  HENT:     Head: Normocephalic and atraumatic.  Eyes:     Conjunctiva/sclera: Conjunctivae normal.  Pulmonary:     Effort: Pulmonary effort is  normal.  Abdominal:     Palpations: Abdomen is soft.  Musculoskeletal:     Cervical back: Normal range of motion.  Skin:    General: Skin is warm and dry.     Capillary Refill: Capillary refill takes less than 2 seconds.  Neurological:     Mental Status: She is alert and oriented to person, place, and  time.  Psychiatric:        Behavior: Behavior normal.     Musculoskeletal Exam: ***  CDAI Exam: CDAI Score: -- Patient Global: --; Provider Global: -- Swollen: --; Tender: -- Joint Exam 08/21/2020   No joint exam has been documented for this visit   There is currently no information documented on the homunculus. Go to the Rheumatology activity and complete the homunculus joint exam.  Investigation: No additional findings.  Imaging: LONG TERM MONITOR (3-14 DAYS)  Result Date: 07/22/2020 ZIO XT reviewed.  6 days, 21 hours analyzed.  Predominant rhythm is sinus with heart rate ranging from 48 bpm up to 141 bpm and average heart rate 83 bpm.  There were rare PACs including couplets representing less than 1% total beats.  There were rare PVCs representing less than 1% total beats.  No significant arrhythmias or pauses.   Recent Labs: Lab Results  Component Value Date   WBC 9.2 03/05/2020   HGB 14.0 03/05/2020   PLT 432 (H) 03/05/2020   NA 139 03/05/2020   K 4.7 03/05/2020   CL 104 03/05/2020   CO2 28 03/05/2020   GLUCOSE 105 (H) 03/05/2020   BUN 10 03/05/2020   CREATININE 0.62 03/05/2020   BILITOT 0.3 03/05/2020   ALKPHOS 60 07/05/2017   AST 19 03/05/2020   ALT 34 (H) 03/05/2020   PROT 7.1 03/05/2020   ALBUMIN 3.9 07/05/2017   CALCIUM 9.8 03/05/2020   GFRAA 129 03/05/2020   QFTBGOLDPLUS NEGATIVE 07/23/2019    Speciality Comments: Patient was on Humira in the past.  Patient had an adequate response to Humira and was switched to Cosentyx.  Procedures:  No procedures performed Allergies: Risperdal [risperidone] and Latex   Assessment / Plan:     Visit Diagnoses: Spondyloarthropathy  High risk medication use  Sacroiliitis (HCC)  Episcleritis of both eyes  Trochanteric bursitis of both hips  DDD (degenerative disc disease), lumbar  Primary osteoarthritis of both knees  Essential hypertension  History of gastroesophageal reflux (GERD)  History of fatty  infiltration of liver  History of hypothyroidism  PCOS (polycystic ovarian syndrome)  Depression with anxiety  Orders: No orders of the defined types were placed in this encounter.  No orders of the defined types were placed in this encounter.    Follow-Up Instructions: No follow-ups on file.   Gearldine Bienenstock, PA-C  Note - This record has been created using Dragon software.  Chart creation errors have been sought, but may not always  have been located. Such creation errors do not reflect on  the standard of medical care.

## 2020-08-19 ENCOUNTER — Ambulatory Visit (HOSPITAL_COMMUNITY): Admission: RE | Admit: 2020-08-19 | Payer: PPO | Source: Ambulatory Visit

## 2020-08-21 ENCOUNTER — Ambulatory Visit: Payer: PPO | Admitting: Physician Assistant

## 2020-08-21 DIAGNOSIS — H15103 Unspecified episcleritis, bilateral: Secondary | ICD-10-CM

## 2020-08-21 DIAGNOSIS — M170 Bilateral primary osteoarthritis of knee: Secondary | ICD-10-CM

## 2020-08-21 DIAGNOSIS — M5136 Other intervertebral disc degeneration, lumbar region: Secondary | ICD-10-CM

## 2020-08-21 DIAGNOSIS — M461 Sacroiliitis, not elsewhere classified: Secondary | ICD-10-CM

## 2020-08-21 DIAGNOSIS — Z79899 Other long term (current) drug therapy: Secondary | ICD-10-CM

## 2020-08-21 DIAGNOSIS — F418 Other specified anxiety disorders: Secondary | ICD-10-CM

## 2020-08-21 DIAGNOSIS — I10 Essential (primary) hypertension: Secondary | ICD-10-CM

## 2020-08-21 DIAGNOSIS — Z8719 Personal history of other diseases of the digestive system: Secondary | ICD-10-CM

## 2020-08-21 DIAGNOSIS — Z8639 Personal history of other endocrine, nutritional and metabolic disease: Secondary | ICD-10-CM

## 2020-08-21 DIAGNOSIS — M7061 Trochanteric bursitis, right hip: Secondary | ICD-10-CM

## 2020-08-21 DIAGNOSIS — M47819 Spondylosis without myelopathy or radiculopathy, site unspecified: Secondary | ICD-10-CM

## 2020-08-21 DIAGNOSIS — E282 Polycystic ovarian syndrome: Secondary | ICD-10-CM

## 2020-09-08 NOTE — Progress Notes (Signed)
Office Visit Note  Patient: Allison Atkinson             Date of Birth: 03/12/1975           MRN: [id number]             PCP: Carylon Perches, MD Referring: Carylon Perches, MD Visit Date: 09/22/2020 Occupation: @GUAROCC @  Subjective:  Pain in both hands and feet   History of Present Illness: Allison Atkinson is a 45 y.o. female with history of spondyloarthropathy episcleritis, and DDD.  She is on cosentyx 300 mg sq injections every month, methotrexate 0.8 ml sq injections once weekly, and folic acid 2 mg daily.  She started on methotrexate in January 2022.  Over the past 2 months has been experiencing increased pain in both hands and both feet.  She states that her hands feel tight but she has not noticed visible swelling.  She describes the pain in her hands and feet as an aching sensation.  She is also been experiencing symptoms of plantar fasciitis in both feet worse in the evenings.  She states that she recently had to delay Cosentyx dosing by 1-1/2 weeks and has missed 2 doses of methotrexate over the past several months.  According to the patient her hand pain started prior to having to delay her Cosentyx injection.  She states that she typically experiences increased generalized aching and stiffness 3 to 4 days prior to her Cosentyx injections on a monthly basis.  She continues to have occasional SI joint discomfort but denies any pain at this time.  She has some discomfort on the lateral aspect of both hips if she walks for long distances.  She denies any signs or symptoms of an episcleritis flare.  She has not had any recent rashes. She denies any recent infections.    Activities of Daily Living:  Patient reports morning stiffness for 2 hours.   Patient Reports nocturnal pain.  Difficulty dressing/grooming: Denies Difficulty climbing stairs: Reports Difficulty getting out of chair: Reports Difficulty using hands for taps, buttons, cutlery, and/or writing: Reports  Review of Systems   Constitutional:  Positive for fatigue.  HENT:  Negative for mouth sores, mouth dryness and nose dryness.   Eyes:  Negative for pain, redness, itching and dryness.  Respiratory:  Negative for shortness of breath and difficulty breathing.   Cardiovascular:  Negative for chest pain and palpitations.  Gastrointestinal:  Positive for diarrhea. Negative for blood in stool and constipation.  Endocrine: Negative for increased urination.  Genitourinary:  Positive for painful urination. Negative for difficulty urinating.  Musculoskeletal:  Positive for joint pain, joint pain, joint swelling and morning stiffness. Negative for myalgias, muscle tenderness and myalgias.  Skin:  Negative for color change, rash and redness.  Allergic/Immunologic: Negative for susceptible to infections.  Neurological:  Positive for headaches and memory loss. Negative for dizziness, numbness and weakness.  Hematological:  Negative for bruising/bleeding tendency.  Psychiatric/Behavioral:  Negative for confusion.    PMFS History:  Patient Active Problem List   Diagnosis Date Noted   Obesity 07/04/2017   High risk medication use 03/30/2016   Acute midline low back pain without sciatica 03/30/2016   Primary osteoarthritis of both knees 01/12/2016   Spondylosis of lumbar region without myelopathy or radiculopathy 01/12/2016   Sacroiliitis, not elsewhere classified (HCC) 01/12/2016   Chronic diarrhea 01/12/2016   History of gastroesophageal reflux (GERD) 01/12/2016   History of fatty infiltration of liver 01/12/2016   Hypertension 05/09/2013  Palpitations 04/11/2013   Spondyloarthropathy (HCC) 10/24/2009   PCOS (polycystic ovarian syndrome) 01/25/2003   Depression with anxiety 01/25/2003   GERD (gastroesophageal reflux disease) 01/25/2003   Hypothyroidism 01/24/2001    Past Medical History:  Diagnosis Date   Anxiety    Bipolar disorder (HCC)    Diabetes mellitus without complication (HCC) 05/2017   Fatty liver  disease, nonalcoholic    History of cholecystectomy    Hypertension    Polycystic ovarian disease    RA (rheumatoid arthritis) (HCC)    Thyroid disease    hypothyroid    Family History  Problem Relation Age of Onset   Hypertension Mother    Alcohol abuse Father    Past Surgical History:  Procedure Laterality Date   CHOLECYSTECTOMY  1999   COLONOSCOPY W/ BIOPSIES  2010   ESOPHAGOGASTRODUODENOSCOPY  2010   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   Procedure: LAPAROSCOPIC ROUX-EN-Y GASTRIC BYPASS WITH UPPER ENDOSCOPY;  Surgeon: Sheliah Hatch, De Blanch, MD;  Location: [facility];  Service: General;  Laterality: N/A;   KNEE ARTHROPLASTY     KNEE ARTHROSCOPY Right    Social History   Social History Narrative   Not on file   Immunization History  Administered Date(s) Administered   Influenza,inj,Quad PF,6+ Mos 10/19/2017   Influenza-Unspecified 11/13/2012   Moderna Sars-Covid-2 Vaccination 04/25/2019, 05/23/2019, 12/12/2019, 05/23/2020   Pneumococcal Polysaccharide-23 10/24/2008     Objective: Vital Signs: BP 121/84 (BP Location: Left Arm, Patient Position: Sitting, Cuff Size: Normal)   Pulse 85   Ht 5' 3.5" (1.613 m)   Wt 263 lb (119.3 kg)   BMI 45.86 kg/m    Physical Exam Vitals and nursing note reviewed.  Constitutional:      Appearance: She is well-developed.  HENT:     Head: Normocephalic and atraumatic.  Eyes:     Conjunctiva/sclera: Conjunctivae normal.  Pulmonary:     Effort: Pulmonary effort is normal.  Abdominal:     Palpations: Abdomen is soft.  Musculoskeletal:     Cervical back: Normal range of motion.  Skin:    General: Skin is warm and dry.     Capillary Refill: Capillary refill takes less than 2 seconds.  Neurological:     Mental Status: She is alert and oriented to person, place, and time.  Psychiatric:        Behavior: Behavior normal.     Musculoskeletal Exam: C-spine, thoracic spine, lumbar spine have good range of motion with no discomfort.  Some  midline spinal tenderness in the lumbar region.  No tenderness over SI joints.  Shoulder joints, elbow joints, wrist joints, MCPs, PIPs, DIPs have good range of motion with no synovitis.  Tenderness over the right second through fifth MCP joints.  Tenderness over the left first through fourth MCP joints but no synovitis noted.  Complete fist formation bilaterally.  Hip joints have some discomfort with range of motion bilaterally.  Tenderness over bilateral trochanteric bursa.  Knee joints have good range of motion with no warmth or effusion.  Ankle joints have good range of motion with no tenderness or joint swelling.  Tenderness along the plantar fascial both feet.  No evidence of Achilles tendinitis.  Tenderness over MTP joints but no synovitis was noted.  CDAI Exam: CDAI Score: -- Patient Global: --; Provider Global: -- Swollen: 0 ; Tender: 19  Joint Exam 09/22/2020      Right  Left  MCP 1      Tender  MCP 2   Tender  Tender  MCP 3   Tender   Tender  MCP 4   Tender   Tender  MCP 5   Tender     Lumbar Spine   Tender     MTP 1   Tender   Tender  MTP 2   Tender   Tender  MTP 3   Tender   Tender  MTP 4   Tender   Tender  MTP 5   Tender   Tender     Investigation: No additional findings.  Imaging: No results found.  Recent Labs: Lab Results  Component Value Date   WBC 13.7 (H) 09/10/2020   HGB 14.2 09/10/2020   PLT 405 (H) 09/10/2020   NA 136 09/10/2020   K 4.6 09/10/2020   CL 103 09/10/2020   CO2 26 09/10/2020   GLUCOSE 106 (H) 09/10/2020   BUN 7 09/10/2020   CREATININE 0.65 09/10/2020   BILITOT 0.3 09/10/2020   ALKPHOS 60 07/05/2017   AST 15 09/10/2020   ALT 16 09/10/2020   PROT 7.3 09/10/2020   ALBUMIN 3.9 07/05/2017   CALCIUM 9.9 09/10/2020   GFRAA 129 03/05/2020   QFTBGOLDPLUS NEGATIVE 07/23/2019    Speciality Comments: Patient was on Humira in the past.  Patient had an adequate response to Humira and was switched to Cosentyx.  Procedures:  No procedures  performed Allergies: Risperdal [risperidone] and Latex   Assessment / Plan:     Visit Diagnoses: Spondyloarthropathy -She presents today with increased pain and stiffness in both hands and both feet over the past 2 months.  She has not had any change in activity level and has been unable to identify a trigger for her increased joint pain.  She has no synovitis or dactylitis on examination today.  She has tenderness over multiple MCP joints in all MTP joints as described above.  Some tenderness along the plantar fascia of both feet noted.  No evidence of Achilles tendinitis noted.  X-rays of both hands and feet were obtained today.  We will also check a sed rate and CRP today.  She remains on Cosentyx 300 mg subcutaneous injections every 28 days, methotrexate 0.8 mL apiece injections once weekly, and folic acid 2 mg by mouth daily.  She has been on methotrexate as combination therapy since January 2022 and has been tolerating it without any side effects.  She recently had to postpone cosentyx by 1.5 weeks and has missed about 2 doses of MTX recently.  According to the patient and her hand and feet pain presented prior to missing the doses of these medications.  She has not had any signs or symptoms of an episcleritis flare recently.  She has no SI joint tenderness to palpation on examination today.  She was given a list of natural anti-inflammatories to start taking.  She was strongly encouraged to avoid taking NSAIDs and Tylenol due to history of elevated LFTs.  Further recommendations will be pending lab results as well as x-ray results.  We discussed the importance of remaining compliant with Cosentyx and methotrexate injections.  She was advised to notify us if her discomfort persists or worsens.  She will follow-up in the office in 2 months to reassess how she is doing on combination therapy.  Plan: Sedimentation rate, C-reactive protein  High risk medication use - Cosentyx 300 mg sq injections every 28  days, methotrexate 0.8 ml sq injections once weekly, and folic acid 2 mg by mouth daily. TB gold negative on 07/23/19.  Order  for TB gold released. CBC and CMP updated on 09/10/20.  Her next lab work will be due in November and every 3 months.  Standing orders for CBC and CMP are in place.  - Plan: QuantiFERON-TB Gold Plus She has not had any recent infections.  We discussed the importance of holding Cosentyx and methotrexate if she develops signs or symptoms of an infection and to resume once the infection is completely cleared. Previously had an adequate response to Humira.  Sacroiliitis (HCC): She has no SI joint tenderness on examination today.  She experiences intermittent discomfort and stiffness in her lower back.  Sed rate and CRP will be checked today.  Episcleritis of both eyes - Dr. Fabian Sharp.  She started to have a flare of episcleritis in the left eye starting in January 2021 which resolved in July 2021.  She has not had any recurrence.  No signs or symptoms of an episcleritis flare recently.  She will remain on the current treatment regimen.  Pain in both hands - She has been experiencing increased pain in both hands for the past 2 months.  She has tenderness over multiple MCP joints as described above.  No obvious synovitis was noted.  She was able to make a complete fist bilaterally.  She describes the discomfort as an aching sensation as well as tightness in her hands.  X-rays of both hands were obtained today.  We will also check a sed rate and CRP.  We discussed the list of natural anti-inflammatories which she can start taking.  She was also given a jar gripper to assist her with ADLs.  She was advised to notify us if her discomfort persists or worsens.  She will follow-up in the office in 2 months.  Plan: Sedimentation rate, XR Hand 2 View Right, XR Hand 2 View Left, C-reactive protein  Pain in both feet - She has been experiencing increased discomfort in both feet.  She has tenderness  of all MTP joints but no synovitis or dactylitis was noted.  She was able to bear full weight on both feet.  She has some tenderness over the plantar fascia of both feet.  X-rays of both feet were performed today.  We will obtain sed rate and CRP today.  She was advised to notify us if her discomfort persists or worsens.  Discussed the importance of wearing proper fitting shoes.  Plan: Sedimentation rate, XR Foot 2 Views Left, XR Foot 2 Views Right, C-reactive protein  Plantar fasciitis, bilateral: She has tenderness along the plantar fascia of both feet.  She has been experiencing increased discomfort especially in the evenings over the past several weeks.  We discussed the importance of wearing proper fitting shoes.  We also discussed the importance of performing stretching exercises on a daily basis.  She was advised to notify us if her discomfort persists or worsens.  Primary osteoarthritis of both knees: She has good range of motion of both knee joints on examination today.  No warmth or effusion was noted.  Trochanteric bursitis of both hips: She continues to experience intermittent discomfort due to trochanteric bursitis of both hips.  Her discomfort is typically exacerbated by walking prolonged distances.  We discussed the importance of performing stretching exercises daily.  Pain in thoracic spine - Mild spondylosis of the thoracic spine.  No midline spinal tenderness at this time.   DDD (degenerative disc disease), lumbar - She continues to experience intermittent discomfort and stiffness in her lower back.  She  has mild midline spinal tenderness in the lumbar region. No radiculopathy.   Other medical conditions are listed as follows:  Essential hypertension: BP was 121/84 today in the office.   History of fatty infiltration of liver: LFTs WNL on 09/10/20.   History of gastroesophageal reflux (GERD)  Depression with anxiety  History of hypothyroidism  PCOS (polycystic ovarian  syndrome)  Orders: Orders Placed This Encounter  Procedures   XR Hand 2 View Right   XR Hand 2 View Left   XR Foot 2 Views Left   XR Foot 2 Views Right   QuantiFERON-TB Gold Plus   Sedimentation rate   C-reactive protein    Meds ordered this encounter  Medications   Secukinumab, 300 MG Dose, (COSENTYX SENSOREADY, 300 MG,) 150 MG/ML SOAJ    Sig: Inject 300 mg into the skin every 28 (twenty-eight) days.    Dispense:  6 mL    Refill:  0      Follow-Up Instructions: Return in about 2 months (around 11/22/2020) for Spondyloarthropathy.   Gearldine Bienenstock, PA-C  Note - This record has been created using Dragon software.  Chart creation errors have been sought, but may not always  have been located. Such creation errors do not reflect on  the standard of medical care.

## 2020-09-10 ENCOUNTER — Other Ambulatory Visit: Payer: Self-pay

## 2020-09-10 DIAGNOSIS — F3161 Bipolar disorder, current episode mixed, mild: Secondary | ICD-10-CM | POA: Diagnosis not present

## 2020-09-10 DIAGNOSIS — Z79899 Other long term (current) drug therapy: Secondary | ICD-10-CM | POA: Diagnosis not present

## 2020-09-11 DIAGNOSIS — Z111 Encounter for screening for respiratory tuberculosis: Secondary | ICD-10-CM

## 2020-09-11 DIAGNOSIS — Z9225 Personal history of immunosupression therapy: Secondary | ICD-10-CM

## 2020-09-11 LAB — COMPLETE METABOLIC PANEL WITH GFR
AG Ratio: 1.4 (calc) (ref 1.0–2.5)
ALT: 16 U/L (ref 6–29)
AST: 15 U/L (ref 10–30)
Albumin: 4.3 g/dL (ref 3.6–5.1)
Alkaline phosphatase (APISO): 94 U/L (ref 31–125)
BUN: 7 mg/dL (ref 7–25)
CO2: 26 mmol/L (ref 20–32)
Calcium: 9.9 mg/dL (ref 8.6–10.2)
Chloride: 103 mmol/L (ref 98–110)
Creat: 0.65 mg/dL (ref 0.50–0.99)
Globulin: 3 g/dL (calc) (ref 1.9–3.7)
Glucose, Bld: 106 mg/dL — ABNORMAL HIGH (ref 65–99)
Potassium: 4.6 mmol/L (ref 3.5–5.3)
Sodium: 136 mmol/L (ref 135–146)
Total Bilirubin: 0.3 mg/dL (ref 0.2–1.2)
Total Protein: 7.3 g/dL (ref 6.1–8.1)
eGFR: 112 mL/min/{1.73_m2} (ref >=60)

## 2020-09-11 LAB — CBC WITH DIFFERENTIAL/PLATELET
Absolute Monocytes: 918 cells/uL (ref 200–950)
Basophils Absolute: 82 cells/uL (ref 0–200)
Basophils Relative: 0.6 %
Eosinophils Absolute: 164 cells/uL (ref 15–500)
Eosinophils Relative: 1.2 %
HCT: 42.8 % (ref 35.0–45.0)
Hemoglobin: 14.2 g/dL (ref 11.7–15.5)
Lymphs Abs: 3507 cells/uL (ref 850–3900)
MCH: 30.3 pg (ref 27.0–33.0)
MCHC: 33.2 g/dL (ref 32.0–36.0)
MCV: 91.3 fL (ref 80.0–100.0)
MPV: 10 fL (ref 7.5–12.5)
Monocytes Relative: 6.7 %
Neutro Abs: 9028 cells/uL — ABNORMAL HIGH (ref 1500–7800)
Neutrophils Relative %: 65.9 %
Platelets: 405 10*3/uL — ABNORMAL HIGH (ref 140–400)
RBC: 4.69 10*6/uL (ref 3.80–5.10)
RDW: 13.5 % (ref 11.0–15.0)
Total Lymphocyte: 25.6 %
WBC: 13.7 10*3/uL — ABNORMAL HIGH (ref 3.8–10.8)

## 2020-09-11 MED ORDER — COSENTYX SENSOREADY (300 MG) 150 MG/ML ~~LOC~~ SOAJ: 300.0000 mg | SUBCUTANEOUS | 0 refills | Status: DC

## 2020-09-11 NOTE — Telephone Encounter (Signed)
Next Visit: 09/22/2020  Last Visit: 03/05/2020  Last Fill: 04/14/2020  WE:[id number]  Current Dose per office note 03/05/2020: Cosentyx 300 mg sq injections every 28 days  Labs: 09/10/2020 Glucose 106, WBC 13.7, Platelets 405, Neutro Abs 9,028  TB Gold: 07/23/2019 Neg    Okay to refill Cosentyx?

## 2020-09-14 ENCOUNTER — Ambulatory Visit (HOSPITAL_COMMUNITY): Admission: RE | Admit: 2020-09-14 | Payer: PPO | Source: Ambulatory Visit

## 2020-09-17 ENCOUNTER — Ambulatory Visit: Payer: PPO | Admitting: Physician Assistant

## 2020-09-22 ENCOUNTER — Ambulatory Visit: Payer: Self-pay

## 2020-09-22 ENCOUNTER — Other Ambulatory Visit: Payer: Self-pay

## 2020-09-22 ENCOUNTER — Encounter: Payer: Self-pay | Admitting: Physician Assistant

## 2020-09-22 ENCOUNTER — Ambulatory Visit: Payer: PPO | Admitting: Physician Assistant

## 2020-09-22 VITALS — BP 121/84 | HR 85 | Ht 63.5 in | Wt 263.0 lb

## 2020-09-22 DIAGNOSIS — M546 Pain in thoracic spine: Secondary | ICD-10-CM

## 2020-09-22 DIAGNOSIS — M461 Sacroiliitis, not elsewhere classified: Secondary | ICD-10-CM

## 2020-09-22 DIAGNOSIS — Z8639 Personal history of other endocrine, nutritional and metabolic disease: Secondary | ICD-10-CM

## 2020-09-22 DIAGNOSIS — H15103 Unspecified episcleritis, bilateral: Secondary | ICD-10-CM | POA: Diagnosis not present

## 2020-09-22 DIAGNOSIS — Z79899 Other long term (current) drug therapy: Secondary | ICD-10-CM | POA: Diagnosis not present

## 2020-09-22 DIAGNOSIS — M79642 Pain in left hand: Secondary | ICD-10-CM | POA: Diagnosis not present

## 2020-09-22 DIAGNOSIS — F418 Other specified anxiety disorders: Secondary | ICD-10-CM

## 2020-09-22 DIAGNOSIS — M7061 Trochanteric bursitis, right hip: Secondary | ICD-10-CM

## 2020-09-22 DIAGNOSIS — M79672 Pain in left foot: Secondary | ICD-10-CM

## 2020-09-22 DIAGNOSIS — Z8719 Personal history of other diseases of the digestive system: Secondary | ICD-10-CM

## 2020-09-22 DIAGNOSIS — E282 Polycystic ovarian syndrome: Secondary | ICD-10-CM

## 2020-09-22 DIAGNOSIS — M722 Plantar fascial fibromatosis: Secondary | ICD-10-CM | POA: Diagnosis not present

## 2020-09-22 DIAGNOSIS — M170 Bilateral primary osteoarthritis of knee: Secondary | ICD-10-CM

## 2020-09-22 DIAGNOSIS — M47819 Spondylosis without myelopathy or radiculopathy, site unspecified: Secondary | ICD-10-CM

## 2020-09-22 DIAGNOSIS — M79671 Pain in right foot: Secondary | ICD-10-CM

## 2020-09-22 DIAGNOSIS — M5136 Other intervertebral disc degeneration, lumbar region: Secondary | ICD-10-CM | POA: Diagnosis not present

## 2020-09-22 DIAGNOSIS — M79641 Pain in right hand: Secondary | ICD-10-CM

## 2020-09-22 DIAGNOSIS — I10 Essential (primary) hypertension: Secondary | ICD-10-CM

## 2020-09-22 DIAGNOSIS — M7062 Trochanteric bursitis, left hip: Secondary | ICD-10-CM

## 2020-09-22 MED ORDER — COSENTYX SENSOREADY (300 MG) 150 MG/ML ~~LOC~~ SOAJ: 300.0000 mg | SUBCUTANEOUS | 0 refills | Status: DC

## 2020-09-23 NOTE — Progress Notes (Signed)
ESR and CRP WNL

## 2020-09-25 LAB — QUANTIFERON-TB GOLD PLUS
Mitogen-NIL: >10 IU/mL
NIL: 0.07 IU/mL
QuantiFERON-TB Gold Plus: NEGATIVE
TB1-NIL: <0 IU/mL
TB2-NIL: <0 IU/mL

## 2020-09-25 LAB — SEDIMENTATION RATE: Sed Rate: 6 mm/h (ref 0–20)

## 2020-09-25 LAB — C-REACTIVE PROTEIN: CRP: 1.7 mg/L (ref <8.0)

## 2020-09-28 NOTE — Progress Notes (Signed)
TB gold negative

## 2020-09-29 NOTE — Progress Notes (Signed)
X-rays of both hands and feet were consistent with osteoarthritis.  No erosive changes were noted.

## 2020-10-12 ENCOUNTER — Other Ambulatory Visit: Payer: Self-pay | Admitting: Physician Assistant

## 2020-10-12 NOTE — Telephone Encounter (Signed)
Next Visit: 11/24/2020  Last Visit: 09/22/2020  Last Fill: 09/02/2019  Current Dose per office note on 09/22/2020: not discussed   Okay to refill Methocarbamol?

## 2020-11-01 ENCOUNTER — Telehealth: Payer: PPO | Admitting: Family

## 2020-11-01 DIAGNOSIS — J209 Acute bronchitis, unspecified: Secondary | ICD-10-CM

## 2020-11-01 DIAGNOSIS — R051 Acute cough: Secondary | ICD-10-CM | POA: Diagnosis not present

## 2020-11-01 MED ORDER — BENZONATATE 100 MG PO CAPS: 100.0000 mg | ORAL_CAPSULE | Freq: Three times a day (TID) | ORAL | 0 refills | Status: DC | PRN

## 2020-11-01 MED ORDER — PREDNISONE 10 MG (21) PO TBPK: ORAL_TABLET | ORAL | 0 refills | Status: DC

## 2020-11-01 MED ORDER — BENZONATATE 100 MG PO CAPS
100.0000 mg | ORAL_CAPSULE | Freq: Three times a day (TID) | ORAL | 0 refills | Status: DC | PRN
Start: 2020-11-01 — End: 2021-04-20

## 2020-11-01 NOTE — Addendum Note (Signed)
Addended by: Jannifer Rodney A on: 11/01/2020 11:36 AM   Modules accepted: Orders

## 2020-11-01 NOTE — Progress Notes (Signed)

## 2020-11-03 ENCOUNTER — Encounter: Payer: Self-pay | Admitting: Rheumatology

## 2020-11-10 NOTE — Progress Notes (Deleted)
Office Visit Note  Patient: Allison Atkinson             Date of Birth: January 09, 1976           MRN: [id number]             PCP: Carylon Perches, MD Referring: Carylon Perches, MD Visit Date: 11/24/2020 Occupation: @GUAROCC @  Subjective:  No chief complaint on file.   History of Present Illness: Allison Atkinson is a 45 y.o. female ***   Activities of Daily Living:  Patient reports morning stiffness for *** {minute/hour:19697}.   Patient {ACTIONS;DENIES/REPORTS:21021675::"Denies"} nocturnal pain.  Difficulty dressing/grooming: {ACTIONS;DENIES/REPORTS:21021675::"Denies"} Difficulty climbing stairs: {ACTIONS;DENIES/REPORTS:21021675::"Denies"} Difficulty getting out of chair: {ACTIONS;DENIES/REPORTS:21021675::"Denies"} Difficulty using hands for taps, buttons, cutlery, and/or writing: {ACTIONS;DENIES/REPORTS:21021675::"Denies"}  No Rheumatology ROS completed.   PMFS History:  Patient Active Problem List   Diagnosis Date Noted  . Obesity 07/04/2017  . High risk medication use 03/30/2016  . Acute midline low back pain without sciatica 03/30/2016  . Primary osteoarthritis of both knees 01/12/2016  . Spondylosis of lumbar region without myelopathy or radiculopathy 01/12/2016  . Sacroiliitis, not elsewhere classified (HCC) 01/12/2016  . Chronic diarrhea 01/12/2016  . History of gastroesophageal reflux (GERD) 01/12/2016  . History of fatty infiltration of liver 01/12/2016  . Hypertension 05/09/2013  . Palpitations 04/11/2013  . Spondyloarthropathy (HCC) 10/24/2009  . PCOS (polycystic ovarian syndrome) 01/25/2003  . Depression with anxiety 01/25/2003  . GERD (gastroesophageal reflux disease) 01/25/2003  . Hypothyroidism 01/24/2001    Past Medical History:  Diagnosis Date  . Anxiety   . Bipolar disorder (HCC)   . Diabetes mellitus without complication (HCC) 05/2017  . Fatty liver disease, nonalcoholic   . History of cholecystectomy   . Hypertension   . Polycystic ovarian disease   .  RA (rheumatoid arthritis) (HCC)   . Thyroid disease    hypothyroid    Family History  Problem Relation Age of Onset  . Hypertension Mother   . Alcohol abuse Father    Past Surgical History:  Procedure Laterality Date  . CHOLECYSTECTOMY  1999  . COLONOSCOPY W/ BIOPSIES  2010  . ESOPHAGOGASTRODUODENOSCOPY  2010  . 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 KNEE ARTHROPLASTY    . KNEE ARTHROSCOPY Right    Social History   Social History Narrative  . Not on file   Immunization History  Administered Date(s) Administered  . Influenza,inj,Quad PF,6+ Mos 10/19/2017  . Influenza-Unspecified 11/13/2012  . Moderna Sars-Covid-2 Vaccination 04/25/2019, 05/23/2019, 12/12/2019, 05/23/2020  . Pneumococcal Polysaccharide-23 10/24/2008     Objective: Vital Signs: There were no vitals taken for this visit.   Physical Exam   Musculoskeletal Exam: ***  CDAI Exam: CDAI Score: -- Patient Global: --; Provider Global: -- Swollen: --; Tender: -- Joint Exam 11/24/2020   No joint exam has been documented for this visit   There is currently no information documented on the homunculus. Go to the Rheumatology activity and complete the homunculus joint exam.  Investigation: No additional findings.  Imaging: No results found.  Recent Labs: Lab Results  Component Value Date   WBC 13.7 (H) 09/10/2020   HGB 14.2 09/10/2020   PLT 405 (H) 09/10/2020   NA 136 09/10/2020   K 4.6 09/10/2020   CL 103 09/10/2020   CO2 26 09/10/2020   GLUCOSE 106 (H) 09/10/2020   BUN  7 09/10/2020   CREATININE 0.65 09/10/2020   BILITOT 0.3 09/10/2020   ALKPHOS 60 07/05/2017   AST 15 09/10/2020   ALT 16 09/10/2020   PROT 7.3 09/10/2020   ALBUMIN 3.9 07/05/2017   CALCIUM 9.9 09/10/2020   GFRAA 129 03/05/2020   QFTBGOLDPLUS NEGATIVE 09/22/2020     Speciality Comments: Patient was on Humira in the past.  Patient had an adequate response to Humira and was switched to Cosentyx.  Procedures:  No procedures performed Allergies: Risperdal [risperidone] and Latex   Assessment / Plan:     Visit Diagnoses: No diagnosis found.  Orders: No orders of the defined types were placed in this encounter.  No orders of the defined types were placed in this encounter.   Face-to-face time spent with patient was *** minutes. Greater than 50% of time was spent in counseling and coordination of care.  Follow-Up Instructions: No follow-ups on file.   Ellen Henri, CMA  Note - This record has been created using Animal nutritionist.  Chart creation errors have been sought, but may not always  have been located. Such creation errors do not reflect on  the standard of medical care.

## 2020-11-23 ENCOUNTER — Telehealth: Payer: PPO | Admitting: Family Medicine

## 2020-11-23 DIAGNOSIS — Z202 Contact with and (suspected) exposure to infections with a predominantly sexual mode of transmission: Secondary | ICD-10-CM

## 2020-11-23 NOTE — Progress Notes (Signed)
[place]  Need for STI eval and testing  [facility] recommended

## 2020-11-24 ENCOUNTER — Ambulatory Visit: Payer: PPO | Admitting: Rheumatology

## 2020-11-24 DIAGNOSIS — Z79899 Other long term (current) drug therapy: Secondary | ICD-10-CM

## 2020-11-24 DIAGNOSIS — M170 Bilateral primary osteoarthritis of knee: Secondary | ICD-10-CM

## 2020-11-24 DIAGNOSIS — M79641 Pain in right hand: Secondary | ICD-10-CM

## 2020-11-24 DIAGNOSIS — M546 Pain in thoracic spine: Secondary | ICD-10-CM

## 2020-11-24 DIAGNOSIS — H15103 Unspecified episcleritis, bilateral: Secondary | ICD-10-CM

## 2020-11-24 DIAGNOSIS — M47819 Spondylosis without myelopathy or radiculopathy, site unspecified: Secondary | ICD-10-CM

## 2020-11-24 DIAGNOSIS — M79671 Pain in right foot: Secondary | ICD-10-CM

## 2020-11-24 DIAGNOSIS — M5136 Other intervertebral disc degeneration, lumbar region: Secondary | ICD-10-CM

## 2020-11-24 DIAGNOSIS — M722 Plantar fascial fibromatosis: Secondary | ICD-10-CM

## 2020-11-24 DIAGNOSIS — M7061 Trochanteric bursitis, right hip: Secondary | ICD-10-CM

## 2020-11-24 DIAGNOSIS — I10 Essential (primary) hypertension: Secondary | ICD-10-CM

## 2020-11-24 DIAGNOSIS — F418 Other specified anxiety disorders: Secondary | ICD-10-CM

## 2020-11-24 DIAGNOSIS — Z8639 Personal history of other endocrine, nutritional and metabolic disease: Secondary | ICD-10-CM

## 2020-11-24 DIAGNOSIS — Z8719 Personal history of other diseases of the digestive system: Secondary | ICD-10-CM

## 2020-11-24 DIAGNOSIS — M461 Sacroiliitis, not elsewhere classified: Secondary | ICD-10-CM

## 2020-11-24 DIAGNOSIS — E282 Polycystic ovarian syndrome: Secondary | ICD-10-CM

## 2020-11-26 ENCOUNTER — Telehealth: Payer: Self-pay | Admitting: Pharmacist

## 2020-11-26 ENCOUNTER — Other Ambulatory Visit (HOSPITAL_COMMUNITY): Payer: Self-pay

## 2020-11-26 DIAGNOSIS — M47819 Spondylosis without myelopathy or radiculopathy, site unspecified: Secondary | ICD-10-CM

## 2020-11-26 DIAGNOSIS — M461 Sacroiliitis, not elsewhere classified: Secondary | ICD-10-CM

## 2020-11-26 NOTE — Telephone Encounter (Signed)
Received Cosentyx PAP re-enrollment application from [employer]   Will place provider portion in Dr. Deveshwar's folder to be signed (along with med list, insurance card copy, PA approval letter).   Mailed patient her portion today to be completed and returned to pharmacy team with letter noting that income docs are now required which is change from prior years.   Will need FAXED prescription sent with renewal application as well.     Jahne Krukowski, PharmD, MPH, BCPS Clinical Pharmacist (Rheumatology and Pulmonology) 

## 2020-11-30 NOTE — Telephone Encounter (Signed)
Received signed provider portion of [employer] Cosentyx PAP application. Will place in "PAP pending info" folder in pharmacy office while we wait for patient's portion  Loukisha Gunnerson, PharmD, MPH, BCPS Clinical Pharmacist (Rheumatology and Pulmonology) 

## 2020-12-15 ENCOUNTER — Other Ambulatory Visit: Payer: Self-pay | Admitting: *Deleted

## 2020-12-15 DIAGNOSIS — Z79899 Other long term (current) drug therapy: Secondary | ICD-10-CM

## 2020-12-15 DIAGNOSIS — Z6841 Body Mass Index (BMI) 40.0 and over, adult: Secondary | ICD-10-CM | POA: Diagnosis not present

## 2020-12-15 DIAGNOSIS — Z01419 Encounter for gynecological examination (general) (routine) without abnormal findings: Secondary | ICD-10-CM | POA: Diagnosis not present

## 2020-12-15 DIAGNOSIS — R300 Dysuria: Secondary | ICD-10-CM | POA: Diagnosis not present

## 2020-12-15 DIAGNOSIS — E785 Hyperlipidemia, unspecified: Secondary | ICD-10-CM | POA: Diagnosis not present

## 2020-12-15 DIAGNOSIS — Z7984 Long term (current) use of oral hypoglycemic drugs: Secondary | ICD-10-CM | POA: Diagnosis not present

## 2020-12-15 DIAGNOSIS — N390 Urinary tract infection, site not specified: Secondary | ICD-10-CM | POA: Diagnosis not present

## 2020-12-15 DIAGNOSIS — E282 Polycystic ovarian syndrome: Secondary | ICD-10-CM | POA: Diagnosis not present

## 2020-12-15 DIAGNOSIS — E1165 Type 2 diabetes mellitus with hyperglycemia: Secondary | ICD-10-CM | POA: Diagnosis not present

## 2020-12-15 DIAGNOSIS — Z1231 Encounter for screening mammogram for malignant neoplasm of breast: Secondary | ICD-10-CM | POA: Diagnosis not present

## 2020-12-15 DIAGNOSIS — E039 Hypothyroidism, unspecified: Secondary | ICD-10-CM | POA: Diagnosis not present

## 2020-12-16 LAB — COMPLETE METABOLIC PANEL WITH GFR
AG Ratio: 1.6 (calc) (ref 1.0–2.5)
ALT: 26 U/L (ref 6–29)
AST: 23 U/L (ref 10–30)
Albumin: 4.3 g/dL (ref 3.6–5.1)
Alkaline phosphatase (APISO): 76 U/L (ref 31–125)
BUN/Creatinine Ratio: 8 (calc) (ref 6–22)
BUN: 5 mg/dL — ABNORMAL LOW (ref 7–25)
CO2: 28 mmol/L (ref 20–32)
Calcium: 9.6 mg/dL (ref 8.6–10.2)
Chloride: 103 mmol/L (ref 98–110)
Creat: 0.65 mg/dL (ref 0.50–0.99)
Globulin: 2.7 g/dL (calc) (ref 1.9–3.7)
Glucose, Bld: 94 mg/dL (ref 65–99)
Potassium: 4.5 mmol/L (ref 3.5–5.3)
Sodium: 138 mmol/L (ref 135–146)
Total Bilirubin: 0.3 mg/dL (ref 0.2–1.2)
Total Protein: 7 g/dL (ref 6.1–8.1)
eGFR: 112 mL/min/{1.73_m2} (ref >=60)

## 2020-12-16 LAB — CBC WITH DIFFERENTIAL/PLATELET
Absolute Monocytes: 655 cells/uL (ref 200–950)
Basophils Absolute: 82 cells/uL (ref 0–200)
Basophils Relative: 0.7 %
Eosinophils Absolute: 105 cells/uL (ref 15–500)
Eosinophils Relative: 0.9 %
HCT: 39.7 % (ref 35.0–45.0)
Hemoglobin: 13 g/dL (ref 11.7–15.5)
Lymphs Abs: 3042 cells/uL (ref 850–3900)
MCH: 30.4 pg (ref 27.0–33.0)
MCHC: 32.7 g/dL (ref 32.0–36.0)
MCV: 92.8 fL (ref 80.0–100.0)
MPV: 9.7 fL (ref 7.5–12.5)
Monocytes Relative: 5.6 %
Neutro Abs: 7816 cells/uL — ABNORMAL HIGH (ref 1500–7800)
Neutrophils Relative %: 66.8 %
Platelets: 418 10*3/uL — ABNORMAL HIGH (ref 140–400)
RBC: 4.28 10*6/uL (ref 3.80–5.10)
RDW: 13.1 % (ref 11.0–15.0)
Total Lymphocyte: 26 %
WBC: 11.7 10*3/uL — ABNORMAL HIGH (ref 3.8–10.8)

## 2020-12-16 NOTE — Telephone Encounter (Signed)
Patient received application at home for [employer] PAP renewal for her Cosentyx. She states that she forgot to complete and bring to [facility] yesterday, She plans to complete and may have her husband drop off completed application with income documents on Monday, 12/21/20  Chesley Mires, PharmD, MPH, BCPS Clinical Pharmacist (Rheumatology and Pulmonology)

## 2020-12-23 DIAGNOSIS — G43909 Migraine, unspecified, not intractable, without status migrainosus: Secondary | ICD-10-CM | POA: Diagnosis not present

## 2020-12-28 NOTE — Telephone Encounter (Addendum)
Signed patient forms for [employer] PAP renewal application received with income documents.  Submitted a Prior Authorization request to [facility] for COSENTYX via CoverMyMeds. Will update once we receive a response. Auth that was on file expired on 12/29/20  Key: [id number]  Will submit completed PAP once PA approval is received an printed Rx is signed by Sherron Ales, PA-C  Chesley Mires, PharmD, MPH, BCPS Clinical Pharmacist (Rheumatology and Pulmonology)

## 2020-12-29 MED ORDER — COSENTYX SENSOREADY (300 MG) 150 MG/ML ~~LOC~~ SOAJ: 300.0000 mg | SUBCUTANEOUS | 0 refills | Status: DC

## 2020-12-30 NOTE — Telephone Encounter (Signed)
Received notification from [facility] regarding a prior authorization for COSENTYX. Authorization has been APPROVED from 12/29/20 to 12/29/21.   Patient receives Cosentyx through [employer] PAP.  Submitted Patient Assistance RENEWAL Application to [employer] for [employer] along with provider portion, patient portion, med list, insurance card copy, signed hard copy rx, PA and income documents. Will update patient when we receive a response.  Fax# [phone] Phone# [phone]  Chesley Mires, PharmD, MPH, BCPS Clinical Pharmacist (Rheumatology and Pulmonology)

## 2021-01-08 NOTE — Telephone Encounter (Signed)
Called [employer] for update on Cosentyx PAP - renewal applications will be processed starting 01/24/21  Chesley Mires, PharmD, MPH, BCPS Clinical Pharmacist (Rheumatology and Pulmonology)

## 2021-01-12 ENCOUNTER — Other Ambulatory Visit: Payer: Self-pay | Admitting: Physician Assistant

## 2021-01-12 NOTE — Telephone Encounter (Signed)
Next Visit: 02/03/2021  Last Visit: 09/22/2020  Last Fill: 04/23/2020  DX: Spondyloarthropathy  Current Dose per office note 09/22/2020: methotrexate 0.8 ml sq injections once weekly  Labs: 12/15/2020 CMP WNL. WBC count remains borderline elevated but has improved.  Plt count is slightly elevated but stable.  Okay to refill MTX?

## 2021-01-14 ENCOUNTER — Telehealth: Payer: PPO | Admitting: Physician Assistant

## 2021-01-14 DIAGNOSIS — R3989 Other symptoms and signs involving the genitourinary system: Secondary | ICD-10-CM | POA: Diagnosis not present

## 2021-01-14 MED ORDER — SULFAMETHOXAZOLE-TRIMETHOPRIM 800-160 MG PO TABS
1.0000 | ORAL_TABLET | Freq: Two times a day (BID) | ORAL | 0 refills | Status: DC
Start: 2021-01-14 — End: 2021-04-20

## 2021-01-14 NOTE — Progress Notes (Signed)

## 2021-01-20 DIAGNOSIS — N390 Urinary tract infection, site not specified: Secondary | ICD-10-CM | POA: Diagnosis not present

## 2021-01-20 DIAGNOSIS — R3915 Urgency of urination: Secondary | ICD-10-CM | POA: Diagnosis not present

## 2021-01-20 DIAGNOSIS — N3281 Overactive bladder: Secondary | ICD-10-CM | POA: Diagnosis not present

## 2021-01-20 DIAGNOSIS — E039 Hypothyroidism, unspecified: Secondary | ICD-10-CM | POA: Diagnosis not present

## 2021-01-20 NOTE — Progress Notes (Deleted)
Office Visit Note  Patient: Allison Atkinson             Date of Birth: [birth year]           MRN: [id number]             PCP: Asencion Noble, MD Referring: Asencion Noble, MD Visit Date: 02/03/2021 Occupation: @GUAROCC @  Subjective:  No chief complaint on file.   History of Present Illness: Allison Atkinson is a 45 y.o. female ***   Activities of Daily Living:  Patient reports morning stiffness for *** {minute/hour:19697}.   Patient {ACTIONS;DENIES/REPORTS:21021675::"Denies"} nocturnal pain.  Difficulty dressing/grooming: {ACTIONS;DENIES/REPORTS:21021675::"Denies"} Difficulty climbing stairs: {ACTIONS;DENIES/REPORTS:21021675::"Denies"} Difficulty getting out of chair: {ACTIONS;DENIES/REPORTS:21021675::"Denies"} Difficulty using hands for taps, buttons, cutlery, and/or writing: {ACTIONS;DENIES/REPORTS:21021675::"Denies"}  No Rheumatology ROS completed.   PMFS History:  Patient Active Problem List   Diagnosis Date Noted   Obesity 07/04/2017   High risk medication use 03/30/2016   Acute midline low back pain without sciatica 03/30/2016   Primary osteoarthritis of both knees 01/12/2016   Spondylosis of lumbar region without myelopathy or radiculopathy 01/12/2016   Sacroiliitis, not elsewhere classified ([place]) 01/12/2016   Chronic diarrhea 01/12/2016   History of gastroesophageal reflux (GERD) 01/12/2016   History of fatty infiltration of liver 01/12/2016   Hypertension 05/09/2013   Palpitations 04/11/2013   Spondyloarthropathy ([place]) 10/24/2009   PCOS (polycystic ovarian syndrome) 01/25/2003   Depression with anxiety 01/25/2003   GERD (gastroesophageal reflux disease) 01/25/2003   Hypothyroidism 01/24/2001    Past Medical History:  Diagnosis Date   Anxiety    Bipolar disorder ([place])    Diabetes mellitus without complication ([place]) [id number]   Fatty liver disease, nonalcoholic    History of cholecystectomy    Hypertension    Polycystic ovarian disease    RA (rheumatoid  arthritis) ([place])    Thyroid disease    hypothyroid    Family History  Problem Relation Age of Onset   Hypertension Mother    Alcohol abuse Father    Past Surgical History:  Procedure Laterality Date   CHOLECYSTECTOMY  1999   COLONOSCOPY W/ BIOPSIES  2010   ESOPHAGOGASTRODUODENOSCOPY  2010   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   Procedure: LAPAROSCOPIC ROUX-EN-Y GASTRIC BYPASS WITH UPPER ENDOSCOPY;  Surgeon: Kieth Brightly, Arta Bruce, MD;  Location: [facility];  Service: General;  Laterality: N/A;   KNEE ARTHROPLASTY     KNEE ARTHROSCOPY Right    Social History   Social History Narrative   Not on file   Immunization History  Administered Date(s) Administered   Influenza,inj,Quad PF,6+ Mos 10/19/2017   Influenza-Unspecified 11/13/2012   Moderna Sars-Covid-2 Vaccination 04/25/2019, 05/23/2019, 12/12/2019, 05/23/2020   Pneumococcal Polysaccharide-23 10/24/2008     Objective: Vital Signs: There were no vitals taken for this visit.   Physical Exam   Musculoskeletal Exam: ***  CDAI Exam: CDAI Score: -- Patient Global: --; Provider Global: -- Swollen: --; Tender: -- Joint Exam 02/03/2021   No joint exam has been documented for this visit   There is currently no information documented on the homunculus. Go to the Rheumatology activity and complete the homunculus joint exam.  Investigation: No additional findings.  Imaging: No results found.  Recent Labs: Lab Results  Component Value Date   WBC 11.7 (H) 12/15/2020   HGB 13.0 12/15/2020   PLT 418 (H) 12/15/2020   NA 138 12/15/2020   K 4.5 12/15/2020   CL 103 12/15/2020   CO2 28 12/15/2020   GLUCOSE 94 12/15/2020   BUN 5 (  L) 12/15/2020   CREATININE 0.65 12/15/2020   BILITOT 0.3 12/15/2020   ALKPHOS 60 07/05/2017   AST 23 12/15/2020   ALT 26 12/15/2020   PROT 7.0 12/15/2020   ALBUMIN 3.9 07/05/2017   CALCIUM 9.6 12/15/2020   GFRAA 129 03/05/2020   QFTBGOLDPLUS NEGATIVE 09/22/2020    Speciality Comments: Patient  was on Humira in the past.  Patient had an adequate response to Humira and was switched to Cosentyx.  Procedures:  No procedures performed Allergies: Risperdal [risperidone] and Latex   Assessment / Plan:     Visit Diagnoses: No diagnosis found.  Orders: No orders of the defined types were placed in this encounter.  No orders of the defined types were placed in this encounter.   Face-to-face time spent with patient was *** minutes. Greater than 50% of time was spent in counseling and coordination of care.  Follow-Up Instructions: No follow-ups on file.   Ellen Henri, CMA  Note - This record has been created using Animal nutritionist.  Chart creation errors have been sought, but may not always  have been located. Such creation errors do not reflect on  the standard of medical care.

## 2021-01-22 ENCOUNTER — Encounter (HOSPITAL_COMMUNITY): Payer: Self-pay | Admitting: *Deleted

## 2021-02-03 ENCOUNTER — Ambulatory Visit: Payer: PPO | Admitting: Physician Assistant

## 2021-02-16 NOTE — Telephone Encounter (Signed)
Received a fax from  [employer] regarding an approval for COSENTYX patient assistance from 02/16/21 to 01/23/22.   Phone number: [phone]  Chesley Mires, PharmD, MPH, BCPS Clinical Pharmacist (Rheumatology and Pulmonology)

## 2021-02-17 ENCOUNTER — Other Ambulatory Visit: Payer: Self-pay

## 2021-02-17 DIAGNOSIS — M47819 Spondylosis without myelopathy or radiculopathy, site unspecified: Secondary | ICD-10-CM

## 2021-02-17 DIAGNOSIS — M461 Sacroiliitis, not elsewhere classified: Secondary | ICD-10-CM

## 2021-02-17 MED ORDER — COSENTYX SENSOREADY (300 MG) 150 MG/ML ~~LOC~~ SOAJ: 300.0000 mg | SUBCUTANEOUS | 0 refills | Status: DC

## 2021-02-17 NOTE — Telephone Encounter (Signed)
Next Visit: 1/12/25/2021   Last Visit: 09/22/2020  DX: Spondyloarthropathy   Current Dose per office note 09/22/2020: Cosentyx 300 mg sq injections every 28 days  Labs: 12/15/2020 CMP WNL. WBC count remains borderline elevated but has improved.  Plt count is slightly elevated but stable.    TB Gold: 09/22/2020 Neg   Okay to refill Cosentyx?

## 2021-02-17 NOTE — Telephone Encounter (Signed)
Patient called requesting prescription refill of Cosentyx.

## 2021-02-25 ENCOUNTER — Ambulatory Visit: Payer: PPO | Admitting: Physician Assistant

## 2021-03-01 NOTE — Progress Notes (Signed)
Office Visit Note  Patient: Allison Atkinson             Date of Birth: May 21, 1975           MRN: [id number]             PCP: Carylon Perches, MD Referring: Carylon Perches, MD Visit Date: 03/15/2021 Occupation: @GUAROCC @  Subjective:  Right foot pain   History of Present Illness: Allison Atkinson is a 46 y.o. female with history of spondyloarthropathy, episcleritis, and osteoarthritis. She is prescribed Cosentyx 300 mg subcutaneous injections every 28 days, methotrexate 0.8 mL sq injections once weekly, and folic acid 2 mg daily.  She has been holding Cosentyx and methotrexate for the past 1-1/2 months due to recurrent UTIs.  She recently completed her third course of antibiotics.  She is currently asymptomatic and not taking any of antibiotics.  [facility] and is scheduled for an ultrasound. She denies any other recent infections.   She has been experiencing intermittent pain in both feet since her last office visit with [place] in August 2022.  She states that she has had increased right foot pain for the past 1 week.  She states that it feels as though she is walking on a pebble.  She denies any swelling or redness in her toes.  She denies any Achilles tendinitis or plantar fasciitis.  She has been taking Aleve as needed for symptomatic relief.  She denies any signs or symptoms of an episcleritis flare.  She has not had any recent rashes.  She denies any SI joint discomfort at this time.    Activities of Daily Living:  Patient reports morning stiffness for 2 hours.   Patient Reports nocturnal pain.  Difficulty dressing/grooming: Denies Difficulty climbing stairs: Reports Difficulty getting out of chair: Denies Difficulty using hands for taps, buttons, cutlery, and/or writing: Denies  Review of Systems  Constitutional:  Positive for fatigue.  HENT:  Negative for mouth sores, mouth dryness and nose dryness.   Eyes:  Negative for pain, itching and dryness.   Respiratory:  Negative for shortness of breath and difficulty breathing.   Cardiovascular:  Negative for chest pain and palpitations.  Gastrointestinal:  Positive for diarrhea. Negative for blood in stool and constipation.  Endocrine: Positive for increased urination.  Genitourinary:  Positive for painful urination.  Musculoskeletal:  Positive for joint pain, joint pain and morning stiffness. Negative for joint swelling, myalgias, muscle tenderness and myalgias.  Skin:  Negative for color change, rash and redness.  Allergic/Immunologic: Positive for susceptible to infections.  Neurological:  Positive for headaches, memory loss and weakness. Negative for dizziness and numbness.  Hematological:  Positive for bruising/bleeding tendency.  Psychiatric/Behavioral:  Negative for confusion.    PMFS History:  Patient Active Problem List   Diagnosis Date Noted   Obesity 07/04/2017   High risk medication use 03/30/2016   Acute midline low back pain without sciatica 03/30/2016   Primary osteoarthritis of both knees 01/12/2016   Spondylosis of lumbar region without myelopathy or radiculopathy 01/12/2016   Sacroiliitis, not elsewhere classified (HCC) 01/12/2016   Chronic diarrhea 01/12/2016   History of gastroesophageal reflux (GERD) 01/12/2016   History of fatty infiltration of liver 01/12/2016   Hypertension 05/09/2013   Palpitations 04/11/2013   Spondyloarthropathy (HCC) 10/24/2009   PCOS (polycystic ovarian syndrome) 01/25/2003   Depression with anxiety 01/25/2003   GERD (gastroesophageal reflux disease) 01/25/2003   Hypothyroidism 01/24/2001    Past Medical History:  Diagnosis Date   Anxiety    Bipolar disorder ([place])    Diabetes mellitus without complication ([place]) 123456   Fatty liver disease, nonalcoholic    History of cholecystectomy    Hypertension    Polycystic ovarian disease    RA (rheumatoid arthritis) ([place])    Thyroid disease    hypothyroid    Family History  Problem  Relation Age of Onset   Hypertension Mother    Alcohol abuse Father    Past Surgical History:  Procedure Laterality Date   CHOLECYSTECTOMY  1999   COLONOSCOPY W/ BIOPSIES  2010   ESOPHAGOGASTRODUODENOSCOPY  2010   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   Procedure: LAPAROSCOPIC ROUX-EN-Y GASTRIC BYPASS WITH UPPER ENDOSCOPY;  Surgeon: Kieth Brightly, Arta Bruce, MD;  Location: [facility];  Service: General;  Laterality: N/A;   KNEE ARTHROPLASTY     KNEE ARTHROSCOPY Right    Social History   Social History Narrative   Not on file   Immunization History  Administered Date(s) Administered   Influenza,inj,Quad PF,6+ Mos 10/19/2017   Influenza-Unspecified 11/13/2012   Moderna Sars-Covid-2 Vaccination 04/25/2019, 05/23/2019, 12/12/2019, 05/23/2020   Pneumococcal Polysaccharide-23 10/24/2008     Objective: Vital Signs: BP 125/86 (BP Location: Left Arm, Patient Position: Sitting, Cuff Size: Large)    Pulse 84    Ht 5' 3.5" (1.613 m)    Wt 267 lb 12.8 oz (121.5 kg)    BMI 46.69 kg/m    Physical Exam Vitals and nursing note reviewed.  Constitutional:      Appearance: She is well-developed.  HENT:     Head: Normocephalic and atraumatic.  Eyes:     Conjunctiva/sclera: Conjunctivae normal.  Cardiovascular:     Rate and Rhythm: Normal rate and regular rhythm.     Heart sounds: Normal heart sounds.  Pulmonary:     Effort: Pulmonary effort is normal.     Breath sounds: Normal breath sounds.  Abdominal:     General: Bowel sounds are normal.     Palpations: Abdomen is soft.  Musculoskeletal:     Cervical back: Normal range of motion.  Lymphadenopathy:     Cervical: No cervical adenopathy.  Skin:    General: Skin is warm and dry.     Capillary Refill: Capillary refill takes less than 2 seconds.  Neurological:     Mental Status: She is alert and oriented to person, place, and time.  Psychiatric:        Behavior: Behavior normal.     Musculoskeletal Exam: C-spine, thoracic spine, and lumbar  spine good ROM. No midline spinal tenderness.  No SI joint tenderness. Shoulder joints, elbow joints, wrist joints, MCPs, PIPs, and DIPs good ROM with no synovitis.  Complete fist formation bilaterally.  Hip joints, knee joints, and ankle joints have good ROM with no discomfort. No warmth or effusion of knee joints.  No tenderness or swelling of ankle joints.  No evidence of plantar fasciitis or achilles tendonitis. Tenderness over the right 2nd MTP joint.   CDAI Exam: CDAI Score: -- Patient Global: --; Provider Global: -- Swollen: --; Tender: -- Joint Exam 03/15/2021   No joint exam has been documented for this visit   There is currently no information documented on the homunculus. Go to the Rheumatology activity and complete the homunculus joint exam.  Investigation: No additional findings.  Imaging: No results found.  Recent Labs: Lab Results  Component Value Date   WBC 11.7 (H) 12/15/2020   HGB 13.0 12/15/2020   PLT 418 (H)  12/15/2020   NA 138 12/15/2020   K 4.5 12/15/2020   CL 103 12/15/2020   CO2 28 12/15/2020   GLUCOSE 94 12/15/2020   BUN 5 (L) 12/15/2020   CREATININE 0.65 12/15/2020   BILITOT 0.3 12/15/2020   ALKPHOS 60 07/05/2017   AST 23 12/15/2020   ALT 26 12/15/2020   PROT 7.0 12/15/2020   ALBUMIN 3.9 07/05/2017   CALCIUM 9.6 12/15/2020   GFRAA 129 03/05/2020   QFTBGOLDPLUS NEGATIVE 09/22/2020    Speciality Comments: Patient was on Humira in the past.  Patient had an adequate response to Humira and was switched to Cosentyx.  Procedures:  No procedures performed Allergies: Risperdal [risperidone] and Latex   Assessment / Plan:     Visit Diagnoses: Spondyloarthropathy: She has not had any signs or symptoms of a flare recently.  She has been holding Cosentyx and methotrexate for the past 1-1/2 months due to recurrent UTIs.  She has established care at [facility] urology and has an upcoming ultrasound scheduled.  She completed her third course of antibiotics  about 1 week ago and has not had any recurrence of symptoms.  She has not restarted Cosentyx or methotrexate yet.  She plans on following up with [facility] for further guidance prior to resume these immunosuppressive agents. She has not noticed any new or worsening symptoms since holding Cosentyx and methotrexate.  She continues to have ongoing pain in both feet since her last office visit in August 2022.  Her symptoms have been most severe in her right foot and she describes the pain as similar to walking on a pebble.  [facility]erral to [facility] was placed today for further evaluation and management.  She has no evidence of Achilles tendonitis or plantar fasciitis.  She has no synovitis or dactylitis on examination today.  She has good range of motion of both ankle joints with no tenderness or inflammation.  She is not experiencing any other joint pain or joint swelling.  She has no SI joint tenderness upon palpation.  She has not had any signs or symptoms of an episcleritis flare. She will follow up in 3-4 months.   High risk medication use - Cosentyx 300 mg sq injections every 28 days, methotrexate 0.8 ml sq injections once weekly, and folic acid 2 mg by mouth daily.   CBC and CMP drawn 12/15/2020.  She is due to update CBC and CMP today.  Orders were released.  Her next lab work will be due in May and every 3 months to monitor for drug toxicity.  TB Gold negative on 09/22/2020.- Plan: CBC with Differential/Platelet, COMPLETE METABOLIC PANEL WITH GFR She has been holding Cosentyx and methotrexate for the past 1-1/2 months due to recurrent UTIs.  She has established care at [facility].  She does not plan on resuming therapy until she has followed back up with [facility] next week for further evaluation and to discuss the next steps.  She was advised to notify us if she continues to have recurrent infections at which time we can alter her current treatment regimen if  necessary.  Sacroiliitis ([place]): She has no SI joint tenderness upon palpation today.   Episcleritis of both eyes - Dr. Wyatt Portela.  She started to have a flare of episcleritis in the left eye starting in January 2021 which resolved in July 2021.  She does not have any signs or symptoms of an episcleritis flare recently.  No conjunctival injection was noted on examination today.  Primary osteoarthritis of both hands: X-rays of both hands were obtained on 09/22/2020 which were consistent with osteoarthritis.  No erosive changes were noted.  She has no tenderness or inflammation on examination today.  She is able to make a complete fist bilaterally.  Primary osteoarthritis of both feet: X-rays of both feet were updated on 09/22/2020 which were consistent with osteoarthritis.  No erosive changes were noted at that time.  She presents today with ongoing pain in both feet especially her right foot.  Her symptoms are currently suspicious for a possible Morton's neuroma.  [facility].  Pain in right foot: She has persistent pain in the right foot.  On examination she has good range of motion of the right ankle joint with no tenderness or synovitis.  No evidence of Achilles tendinitis or plantar fasciitis was noted.  She has some tenderness over the right second MTP joint.  Her symptoms are suspicious for a possible Morton's neuroma.  Referral to [facility] was placed today for further evaluation and management.  Discussed the importance of wearing proper fitting shoes.  Plantar fasciitis, bilateral: Resolved   Primary osteoarthritis of both knees: She has good ROM of both knee joints with crepitus.  No warmth or effusion was noted.    Trochanteric bursitis of both hips: No tenderness over trochanteric bursa.    Pain in thoracic spine - Mild spondylosis of the thoracic spine.  No midline spinal tenderness in the thoracic region.   DDD (degenerative disc  disease), lumbar: She has some ongoing midline spinal tenderness in the lumbar region.  No symptoms of radiculopathy at this time.   Recurrent UTI: She has established care at [facility] urology due to recurrent UTIs.  She has been holding Cosentyx and methotrexate for the past 1-1/2 months due to being on 3 rounds of antibiotics.  She has completed the antibiotics and has not had any recurrence of symptoms.  She plans on continuing to hold therapy until she has been cleared by [facility].  According to the patient she has an upcoming ultrasound scheduled for further evaluation.   Other medical conditions are listed as follows:   History of fatty infiltration of liver  PCOS (polycystic ovarian syndrome)  Depression with anxiety  History of gastroesophageal reflux (GERD)  Essential hypertension: BP was 125/86 today in the office.   History of hypothyroidism  Orders: Orders Placed This Encounter  Procedures   CBC with Differential/Platelet   COMPLETE METABOLIC PANEL WITH GFR   No orders of the defined types were placed in this encounter.     Follow-Up Instructions: Return for Spondyloarthropathy.   Ofilia Neas, PA-C  Note - This record has been created using Dragon software.  Chart creation errors have been sought, but may not always  have been located. Such creation errors do not reflect on  the standard of medical care.

## 2021-03-10 DIAGNOSIS — N390 Urinary tract infection, site not specified: Secondary | ICD-10-CM | POA: Diagnosis not present

## 2021-03-10 DIAGNOSIS — R350 Frequency of micturition: Secondary | ICD-10-CM | POA: Diagnosis not present

## 2021-03-10 DIAGNOSIS — R351 Nocturia: Secondary | ICD-10-CM | POA: Diagnosis not present

## 2021-03-15 ENCOUNTER — Other Ambulatory Visit: Payer: Self-pay

## 2021-03-15 ENCOUNTER — Ambulatory Visit: Payer: PPO | Admitting: Physician Assistant

## 2021-03-15 ENCOUNTER — Encounter: Payer: Self-pay | Admitting: Physician Assistant

## 2021-03-15 VITALS — BP 125/86 | HR 84 | Ht 63.5 in | Wt 267.8 lb

## 2021-03-15 DIAGNOSIS — M546 Pain in thoracic spine: Secondary | ICD-10-CM

## 2021-03-15 DIAGNOSIS — M7061 Trochanteric bursitis, right hip: Secondary | ICD-10-CM | POA: Diagnosis not present

## 2021-03-15 DIAGNOSIS — E282 Polycystic ovarian syndrome: Secondary | ICD-10-CM

## 2021-03-15 DIAGNOSIS — Z8719 Personal history of other diseases of the digestive system: Secondary | ICD-10-CM

## 2021-03-15 DIAGNOSIS — F418 Other specified anxiety disorders: Secondary | ICD-10-CM

## 2021-03-15 DIAGNOSIS — M461 Sacroiliitis, not elsewhere classified: Secondary | ICD-10-CM

## 2021-03-15 DIAGNOSIS — M79671 Pain in right foot: Secondary | ICD-10-CM

## 2021-03-15 DIAGNOSIS — M47819 Spondylosis without myelopathy or radiculopathy, site unspecified: Secondary | ICD-10-CM | POA: Diagnosis not present

## 2021-03-15 DIAGNOSIS — N390 Urinary tract infection, site not specified: Secondary | ICD-10-CM

## 2021-03-15 DIAGNOSIS — M722 Plantar fascial fibromatosis: Secondary | ICD-10-CM

## 2021-03-15 DIAGNOSIS — M19072 Primary osteoarthritis, left ankle and foot: Secondary | ICD-10-CM

## 2021-03-15 DIAGNOSIS — Z79899 Other long term (current) drug therapy: Secondary | ICD-10-CM | POA: Diagnosis not present

## 2021-03-15 DIAGNOSIS — M5136 Other intervertebral disc degeneration, lumbar region: Secondary | ICD-10-CM

## 2021-03-15 DIAGNOSIS — M170 Bilateral primary osteoarthritis of knee: Secondary | ICD-10-CM | POA: Diagnosis not present

## 2021-03-15 DIAGNOSIS — M7062 Trochanteric bursitis, left hip: Secondary | ICD-10-CM

## 2021-03-15 DIAGNOSIS — M19042 Primary osteoarthritis, left hand: Secondary | ICD-10-CM

## 2021-03-15 DIAGNOSIS — M79641 Pain in right hand: Secondary | ICD-10-CM

## 2021-03-15 DIAGNOSIS — Z8639 Personal history of other endocrine, nutritional and metabolic disease: Secondary | ICD-10-CM

## 2021-03-15 DIAGNOSIS — M19041 Primary osteoarthritis, right hand: Secondary | ICD-10-CM | POA: Diagnosis not present

## 2021-03-15 DIAGNOSIS — M19071 Primary osteoarthritis, right ankle and foot: Secondary | ICD-10-CM

## 2021-03-15 DIAGNOSIS — I10 Essential (primary) hypertension: Secondary | ICD-10-CM

## 2021-03-15 DIAGNOSIS — H15103 Unspecified episcleritis, bilateral: Secondary | ICD-10-CM | POA: Diagnosis not present

## 2021-03-15 LAB — COMPLETE METABOLIC PANEL WITH GFR
AG Ratio: 1.4 (calc) (ref 1.0–2.5)
ALT: 26 U/L (ref 6–29)
AST: 17 U/L (ref 10–30)
Albumin: 3.9 g/dL (ref 3.6–5.1)
Alkaline phosphatase (APISO): 79 U/L (ref 31–125)
BUN/Creatinine Ratio: 8 (calc) (ref 6–22)
BUN: 5 mg/dL — ABNORMAL LOW (ref 7–25)
CO2: 31 mmol/L (ref 20–32)
Calcium: 9 mg/dL (ref 8.6–10.2)
Chloride: 103 mmol/L (ref 98–110)
Creat: 0.63 mg/dL (ref 0.50–0.99)
Globulin: 2.8 g/dL (calc) (ref 1.9–3.7)
Glucose, Bld: 115 mg/dL — ABNORMAL HIGH (ref 65–99)
Potassium: 3.8 mmol/L (ref 3.5–5.3)
Sodium: 139 mmol/L (ref 135–146)
Total Bilirubin: 0.3 mg/dL (ref 0.2–1.2)
Total Protein: 6.7 g/dL (ref 6.1–8.1)
eGFR: 113 mL/min/{1.73_m2} (ref >=60)

## 2021-03-15 LAB — CBC WITH DIFFERENTIAL/PLATELET
Absolute Monocytes: 738 cells/uL (ref 200–950)
Basophils Absolute: 62 cells/uL (ref 0–200)
Basophils Relative: 0.6 %
Eosinophils Absolute: 218 cells/uL (ref 15–500)
Eosinophils Relative: 2.1 %
HCT: 39.1 % (ref 35.0–45.0)
Hemoglobin: 13 g/dL (ref 11.7–15.5)
Lymphs Abs: 3661 cells/uL (ref 850–3900)
MCH: 30 pg (ref 27.0–33.0)
MCHC: 33.2 g/dL (ref 32.0–36.0)
MCV: 90.1 fL (ref 80.0–100.0)
MPV: 9.6 fL (ref 7.5–12.5)
Monocytes Relative: 7.1 %
Neutro Abs: 5720 cells/uL (ref 1500–7800)
Neutrophils Relative %: 55 %
Platelets: 467 10*3/uL — ABNORMAL HIGH (ref 140–400)
RBC: 4.34 10*6/uL (ref 3.80–5.10)
RDW: 12.5 % (ref 11.0–15.0)
Total Lymphocyte: 35.2 %
WBC: 10.4 10*3/uL (ref 3.8–10.8)

## 2021-03-15 NOTE — Patient Instructions (Signed)
Standing Labs °We placed an order today for your standing lab work.  ° °Please have your standing labs drawn in May and every 3 months  ° ° °If possible, please have your labs drawn 2 weeks prior to your appointment so that the provider can discuss your results at your appointment. ° °Please note that you may see your imaging and lab results in MyChart before we have reviewed them. °We may be awaiting multiple results to interpret others before contacting you. °Please allow our office up to 72 hours to thoroughly review all of the results before contacting the office for clarification of your results. ° °We have open lab daily: °Monday through Thursday from 1:30-4:30 PM and Friday from 1:30-4:00 PM °at the office of Dr. Shaili Deveshwar, [facility].   °Please be advised, all patients with office appointments requiring lab work will take precedent over walk-in lab work.  °If possible, please come for your lab work on Monday and Friday afternoons, as you may experience shorter wait times. °The office is located at [address], [address], [place], [place] [address] °No appointment is necessary.   °Labs are drawn by [facility]. Please bring your co-pay at the time of your lab draw.  You may receive a bill from [facility] for your lab work. ° °Please note if you are on Hydroxychloroquine and and an order has been placed for a Hydroxychloroquine level, you will need to have it drawn 4 hours or more after your last dose. ° °If you wish to have your labs drawn at another location, please call the office 24 hours in advance to send orders. ° °If you have any questions regarding directions or hours of operation,  °please call [phone].   °As a reminder, please drink plenty of water prior to coming for your lab work. Thanks! ° °

## 2021-03-15 NOTE — Addendum Note (Signed)
Addended by: Ellen HenriGRAVES, Draya Felker C on: 03/15/2021 03:07 PM   Modules accepted: Orders

## 2021-03-16 NOTE — Progress Notes (Signed)
Glucose is 115.  Rest of CMP WNL.  Platelet count remains elevated but stable. Rest of CBC WNL.

## 2021-03-24 DIAGNOSIS — N390 Urinary tract infection, site not specified: Secondary | ICD-10-CM | POA: Diagnosis not present

## 2021-03-24 DIAGNOSIS — R351 Nocturia: Secondary | ICD-10-CM | POA: Diagnosis not present

## 2021-03-30 ENCOUNTER — Ambulatory Visit: Payer: PPO | Admitting: Podiatry

## 2021-04-01 DIAGNOSIS — R1084 Generalized abdominal pain: Secondary | ICD-10-CM | POA: Diagnosis not present

## 2021-04-01 DIAGNOSIS — N200 Calculus of kidney: Secondary | ICD-10-CM | POA: Diagnosis not present

## 2021-04-01 DIAGNOSIS — R350 Frequency of micturition: Secondary | ICD-10-CM | POA: Diagnosis not present

## 2021-04-08 ENCOUNTER — Ambulatory Visit: Payer: PPO | Admitting: Podiatry

## 2021-04-20 ENCOUNTER — Other Ambulatory Visit: Payer: Self-pay

## 2021-04-20 ENCOUNTER — Ambulatory Visit: Payer: PPO

## 2021-04-20 ENCOUNTER — Ambulatory Visit: Payer: PPO | Admitting: Podiatry

## 2021-04-20 ENCOUNTER — Encounter: Payer: Self-pay | Admitting: Podiatry

## 2021-04-20 DIAGNOSIS — R7989 Other specified abnormal findings of blood chemistry: Secondary | ICD-10-CM | POA: Insufficient documentation

## 2021-04-20 DIAGNOSIS — R197 Diarrhea, unspecified: Secondary | ICD-10-CM | POA: Insufficient documentation

## 2021-04-20 DIAGNOSIS — K602 Anal fissure, unspecified: Secondary | ICD-10-CM | POA: Insufficient documentation

## 2021-04-20 DIAGNOSIS — R109 Unspecified abdominal pain: Secondary | ICD-10-CM | POA: Insufficient documentation

## 2021-04-20 DIAGNOSIS — R194 Change in bowel habit: Secondary | ICD-10-CM | POA: Insufficient documentation

## 2021-04-20 DIAGNOSIS — M778 Other enthesopathies, not elsewhere classified: Secondary | ICD-10-CM

## 2021-04-20 DIAGNOSIS — R635 Abnormal weight gain: Secondary | ICD-10-CM | POA: Insufficient documentation

## 2021-04-20 DIAGNOSIS — K625 Hemorrhage of anus and rectum: Secondary | ICD-10-CM | POA: Insufficient documentation

## 2021-04-20 DIAGNOSIS — R152 Fecal urgency: Secondary | ICD-10-CM | POA: Insufficient documentation

## 2021-04-20 DIAGNOSIS — K760 Fatty (change of) liver, not elsewhere classified: Secondary | ICD-10-CM | POA: Insufficient documentation

## 2021-04-20 MED ORDER — TRIAMCINOLONE ACETONIDE 40 MG/ML IJ SUSP
20.0000 mg | Freq: Once | INTRAMUSCULAR | Status: AC
  Administered 2021-04-20: 20 mg

## 2021-04-20 MED ORDER — METHYLPREDNISOLONE 4 MG PO TBPK: ORAL_TABLET | ORAL | 0 refills | Status: DC

## 2021-04-21 NOTE — Progress Notes (Signed)
?Subjective:  ?Patient ID: Allison Atkinson, female    DOB: May 24, 1975,  MRN: [id number] ?HPI ?Chief Complaint  ?Patient presents with  ? Foot Pain  ?  Plantar forefoot right (sub 2nd) - aching, feels like walking on a pebble the last 2 months, RA-rheumatologist did xray-recommended visit here and taking aleve  ? New Patient (Initial Visit)  ? ? ?46 y.o. female presents with the above complaint.  ? ?ROS: Denies fever chills nausea vomiting muscle aches pains calf pain back pain chest pain shortness of breath. ? ?Past Medical History:  ?Diagnosis Date  ? Anxiety   ? Bipolar disorder (HCC)   ? Diabetes mellitus without complication (HCC) 05/2017  ? Fatty liver disease, nonalcoholic   ? History of cholecystectomy   ? Hypertension   ? Polycystic ovarian disease   ? RA (rheumatoid arthritis) (HCC)   ? Thyroid disease   ? hypothyroid  ? ?Past Surgical History:  ?Procedure Laterality Date  ? CHOLECYSTECTOMY  1999  ? COLONOSCOPY W/ BIOPSIES  2010  ? ESOPHAGOGASTRODUODENOSCOPY  2010  ? 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 Procedure: LAPAROSCOPIC ROUX-EN-Y GASTRIC BYPASS WITH UPPER ENDOSCOPY;  Surgeon: Kinsinger, De Blanch, MD;  Location: [facility];  Service: General;  Laterality: N/A;  ? KNEE ARTHROPLASTY    ? KNEE ARTHROSCOPY Right   ? ? ?Current Outpatient Medications:  ?  methylPREDNISolone (MEDROL DOSEPAK) 4 MG TBPK tablet, 6 day dose pack - take as directed, Disp: 21 tablet, Rfl: 0 ?  ARIPiprazole (ABILIFY) 10 MG tablet, Take 1 tablet once a day, Disp: , Rfl:  ?  Armodafinil 150 MG tablet, Take 150 mg by mouth every morning. , Disp: , Rfl: 0 ?  calcium carbonate (OS-CAL) 1250 (500 Ca) MG chewable tablet, Take 1 tablet 3 times a day, Disp: , Rfl:  ?  cholecalciferol (VITAMIN D) 1000 units tablet, Take 1,000 Units by mouth daily. , Disp: , Rfl:  ?  clonazePAM (KLONOPIN) 1 MG tablet, Take 1 tablet (1 mg total) by mouth 3 (three) times daily as needed for anxiety., Disp: , Rfl:  ?  doxepin (SINEQUAN) 25 MG capsule, Take 25 mg  by mouth at bedtime. , Disp: , Rfl: 0 ?  DULoxetine (CYMBALTA) 60 MG capsule, Take 60 mg by mouth daily. , Disp: , Rfl: 0 ?  folic acid (FOLVITE) 1 MG tablet, Take 2 tablets (2 mg total) by mouth daily., Disp: 180 tablet, Rfl: 3 ?  metFORMIN (GLUCOPHAGE) 500 MG tablet, Take 500 mg by mouth 2 (two) times daily., Disp: , Rfl:  ?  methocarbamol (ROBAXIN) 500 MG tablet, TAKE 1 TABLET TWICE DAILY AS NEEDED FOR MUSCLE SPASM(s)., Disp: 60 tablet, Rfl: 0 ?  Methotrexate Sodium (METHOTREXATE, PF,) 50 MG/2ML injection, INJECT 0.8 MLS ONCE A WEEK (USE 1 VIAL PER INJECTION ), Disp: 8 mL, Rfl: 2 ?  Multiple Vitamin (THERA) TABS, Take by mouth., Disp: , Rfl:  ?  pravastatin (PRAVACHOL) 20 MG tablet, Take 20 mg by mouth daily., Disp: , Rfl:  ?  Secukinumab, 300 MG Dose, (COSENTYX SENSOREADY, 300 MG,) 150 MG/ML SOAJ, Inject 300 mg into the skin every 28 (twenty-eight) days., Disp: 6 mL, Rfl: 0 ?  Semaglutide,0.25 or 0.5MG /DOS, (OZEMPIC, 0.25 OR 0.5 MG/DOSE,) 2 MG/1.5ML SOPN, Inject 0.5 mg into the skin once a week., Disp: , Rfl:  ?  SYNTHROID 200 MCG tablet, daily., Disp: , Rfl:  ?  TUBERCULIN SYR 1CC/27GX1/2" (B-D TB SYRINGE 1CC/27GX1/2") 27G X 1/2" 1 ML MISC, 12 Syringes by Does not  apply route once a week., Disp: 12 each, Rfl: 3 ? ?Allergies  ?Allergen Reactions  ? Risperdal [Risperidone] Anaphylaxis  ? Latex Rash  ?  Raised red rash at areas of contact  ? ?Review of Systems ?Objective:  ?There were no vitals filed for this visit. ? ?General: Well developed, nourished, in no acute distress, alert and oriented x3  ? ?Dermatological: Skin is warm, dry and supple bilateral. Nails x 10 are well maintained; remaining integument appears unremarkable at this time. There are no open sores, no preulcerative lesions, no rash or signs of infection present. ? ?Vascular: Dorsalis Pedis artery and Posterior Tibial artery pedal pulses are 2/4 bilateral with immedate capillary fill time. Pedal hair growth present. No varicosities and no lower  extremity edema present bilateral.  ? ?Neruologic: Grossly intact via light touch bilateral. Vibratory intact via tuning fork bilateral. Protective threshold with Semmes Wienstein monofilament intact to all pedal sites bilateral. Patellar and Achilles deep tendon reflexes 2+ bilateral. No Babinski or clonus noted bilateral.  ? ?Musculoskeletal: No gross boney pedal deformities bilateral. No pain, crepitus, or limitation noted with foot and ankle range of motion bilateral. Muscular strength 5/5 in all groups tested bilateral.  She has pain with direct palpation and end range of motion of the second metatarsophalangeal joint. ? ?Gait: Unassisted, Nonantalgic.  ? ? ?Radiographs: ? ?Radiographs from primary reviewed demonstrating no osseous evidence of acute trauma or deformity. ? ?Assessment & Plan:  ? ?Assessment: Capsulitis second metatarsophalangeal joint right foot ? ?Plan: Injected the right foot today 10 mg of Kenalog 5 mg Marcaine point of maximal tenderness between the second and third toes for capsulitis.  Discussed appropriate shoe gear stretching exercise ice therapy sugar modifications.  May need to consider orthotics in the future. ? ? ? ? ?Kikue Gerhart T. [place], DPM ?

## 2021-05-10 ENCOUNTER — Telehealth: Payer: PPO | Admitting: Physician Assistant

## 2021-05-10 DIAGNOSIS — H60392 Other infective otitis externa, left ear: Secondary | ICD-10-CM | POA: Diagnosis not present

## 2021-05-10 MED ORDER — NEOMYCIN-POLYMYXIN-HC 3.5-10000-1 OT SOLN: 3.0000 [drp] | Freq: Four times a day (QID) | OTIC | 0 refills | Status: DC

## 2021-05-10 NOTE — Progress Notes (Signed)

## 2021-06-08 ENCOUNTER — Ambulatory Visit: Payer: PPO | Admitting: Podiatry

## 2021-06-17 DIAGNOSIS — F3175 Bipolar disorder, in partial remission, most recent episode depressed: Secondary | ICD-10-CM | POA: Diagnosis not present

## 2021-06-17 DIAGNOSIS — G4730 Sleep apnea, unspecified: Secondary | ICD-10-CM | POA: Diagnosis not present

## 2021-06-18 NOTE — Progress Notes (Signed)
Office Visit Note  Patient: Allison Atkinson             Date of Birth: 03-13-1975           MRN: [id number]             PCP: Carylon Perches, MD Referring: Carylon Perches, MD Visit Date: 06/30/2021 Occupation: @  Subjective:  Medication management  History of Present Illness: Allison Atkinson is a 46 y.o. female with history of a spondyloarthropathy, osteoarthritis and degenerative disc disease.  She states that she developed urinary tract infections in January 2023.  She had recurrent UTIs and she could not take Cosentyx and methotrexate until a month ago.  She states she has had no UTIs in the last 2 months.  She has been experiencing increased pain and discomfort in her right knee joint.  None of the other joints are painful.  She denies any SI joint discomfort.  She had no recurrence of iritis.  She was seen by the podiatrist after the last visit and she was diagnosed with capsulitis.  She states she responded to the cortisone injection and had no recurrence since then.  Nuys Planter fasciitis currently.  She denies any lower back pain currently.    Activities of Daily Living:  Patient reports morning stiffness for 2 hours.   Patient Reports nocturnal pain.  Difficulty dressing/grooming: Denies Difficulty climbing stairs: Reports Difficulty getting out of chair: Reports Difficulty using hands for taps, buttons, cutlery, and/or writing: Denies  Review of Systems  Constitutional:  Positive for fatigue.  HENT:  Negative for mouth dryness.   Eyes:  Negative for dryness.  Respiratory:  Negative for shortness of breath.   Cardiovascular:  Negative for swelling in legs/feet.  Gastrointestinal:  Negative for constipation.  Endocrine: Positive for heat intolerance.  Genitourinary:  Negative for difficulty urinating.  Musculoskeletal:  Positive for joint pain, joint pain, muscle weakness and morning stiffness.  Skin:  Negative for rash.  Allergic/Immunologic: Negative for susceptible  to infections.  Neurological:  Negative for numbness.  Hematological:  Negative for bruising/bleeding tendency.  Psychiatric/Behavioral:  Negative for sleep disturbance.    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   Cervical spondylosis without myelopathy 10/25/2017   Obesity 07/04/2017   Type 2 diabetes mellitus with hyperglycemia, without long-term current use of insulin (HCC) 03/30/2017   Mixed dyslipidemia 08/16/2016   Vitamin D insufficiency 08/16/2016   High risk medication use 03/30/2016   Acute midline low back pain without sciatica 03/30/2016   Primary osteoarthritis of both knees 01/12/2016   Spondylosis of lumbar region without myelopathy or radiculopathy 01/12/2016   Sacroiliitis, not elsewhere classified (HCC) 01/12/2016   Chronic diarrhea 01/12/2016   History of gastroesophageal reflux (GERD) 01/12/2016   History of fatty infiltration of liver 01/12/2016   Hypertension 05/09/2013   Palpitations 04/11/2013   Spondyloarthropathy (HCC) 10/24/2009   PCOS (polycystic ovarian syndrome) 01/25/2003   Depression with anxiety 01/25/2003   GERD (gastroesophageal reflux disease) 01/25/2003   Hypothyroidism 01/24/2001    Past Medical History:  Diagnosis Date   Anxiety  Bipolar disorder (HCC)    Diabetes mellitus without complication (HCC) 05/2017   Fatty liver disease, nonalcoholic    History of cholecystectomy    Hypertension    Polycystic ovarian disease    RA (rheumatoid arthritis) (HCC)    Thyroid disease    hypothyroid    Family History  Problem Relation Age of Onset    Hypertension Mother    Alcohol abuse Father    Past Surgical History:  Procedure Laterality Date   CHOLECYSTECTOMY  1999   COLONOSCOPY W/ BIOPSIES  2010   ESOPHAGOGASTRODUODENOSCOPY  2010   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   Procedure: LAPAROSCOPIC ROUX-EN-Y GASTRIC BYPASS WITH UPPER ENDOSCOPY;  Surgeon: Rodman Pickle, MD;  Location: [facility];  Service: General;  Laterality: N/A;   KNEE ARTHROPLASTY     KNEE ARTHROSCOPY Right    Social History   Social History Narrative   Not on file   Immunization History  Administered Date(s) Administered   Influenza,inj,Quad PF,6+ Mos 10/19/2017   Influenza-Unspecified 11/13/2012, 10/19/2017   Moderna Sars-Covid-2 Vaccination 04/25/2019, 05/23/2019, 12/12/2019, 05/23/2020   Pneumococcal Polysaccharide-23 10/24/2008   Tdap 06/28/2018     Objective: Vital Signs: BP 119/83 (BP Location: Left Arm, Patient Position: Sitting, Cuff Size: Large)   Pulse 83   Resp 13   Ht 5' 3.5" (1.613 m)   Wt 265 lb 9.6 oz (120.5 kg)   BMI 46.31 kg/m    Physical Exam Vitals and nursing note reviewed.  Constitutional:      Appearance: She is well-developed.  HENT:     Head: Normocephalic and atraumatic.  Eyes:     Conjunctiva/sclera: Conjunctivae normal.  Cardiovascular:     Rate and Rhythm: Normal rate and regular rhythm.     Heart sounds: Normal heart sounds.  Pulmonary:     Effort: Pulmonary effort is normal.     Breath sounds: Normal breath sounds.  Abdominal:     General: Bowel sounds are normal.     Palpations: Abdomen is soft.  Musculoskeletal:     Cervical back: Normal range of motion.  Lymphadenopathy:     Cervical: No cervical adenopathy.  Skin:    General: Skin is warm and dry.     Capillary Refill: Capillary refill takes less than 2 seconds.  Neurological:     Mental Status: She is alert and oriented to person, place, and time.  Psychiatric:        Behavior: Behavior normal.     Musculoskeletal exam: C-spine was in good  range of motion.  She had thoracic kyphosis.  There was no tenderness over SI joints.  Shoulder joints, elbow joints, wrist joints were in good range of motion.  She had no synovitis over MCPs PIPs or DIPs.  Hip joints and knee joints with good range of motion without any warmth swelling or effusion.  She had crepitus in her right knee joint.  There was no tenderness over ankles or MTPs.  CDAI Exam: CDAI Score: -- Patient Global: --; Provider Global: -- Swollen: --; Tender: -- Joint Exam 06/30/2021   No joint exam has been documented for this visit   There is currently no information documented on the homunculus. Go to the Rheumatology activity and complete the homunculus joint exam.  Investigation: No additional findings.  Imaging: No results found.  Recent Labs: Lab Results  Component Value Date   WBC 10.4 03/15/2021   HGB 13.0 03/15/2021   PLT 467 (H) 03/15/2021   NA 139 03/15/2021   K  3.8 03/15/2021   CL 103 03/15/2021   CO2 31 03/15/2021   GLUCOSE 115 (H) 03/15/2021   BUN 5 (L) 03/15/2021   CREATININE 0.63 03/15/2021   BILITOT 0.3 03/15/2021   ALKPHOS 60 07/05/2017   AST 17 03/15/2021   ALT 26 03/15/2021   PROT 6.7 03/15/2021   ALBUMIN 3.9 07/05/2017   CALCIUM 9.0 03/15/2021   GFRAA 129 03/05/2020   QFTBGOLDPLUS NEGATIVE 09/22/2020    Speciality Comments: Patient was on Humira in the past.  Patient had an adequate response to Humira and was switched to Cosentyx.  Procedures:  Large Joint Inj: R knee on 06/30/2021 2:08 PM Indications: pain Details: 27 G 1.5 in needle, medial approach  Arthrogram: No  Medications: 40 mg triamcinolone acetonide 40 MG/ML; 1.5 mL lidocaine 1 % Aspirate: 0 mL Outcome: tolerated well, no immediate complications Procedure, treatment alternatives, risks and benefits explained, specific risks discussed. Consent was given by the patient. Immediately prior to procedure a time out was called to verify the correct patient, procedure,  equipment, support staff and site/side marked as required. Patient was prepped and draped in the usual sterile fashion.    Allergies: Risperdal [risperidone] and Latex   Assessment / Plan:     Visit Diagnoses: Spondyloarthropathy -she had no synovitis on examination.  Patient states that she had to come off Cosentyx and methotrexate in January due to recurrent UTI.  She resumed both medications about a month ago.  She has been experiencing increased pain and discomfort in her right knee joint.  None of the other joints are painful or swollen.  She denies any recurrence of iritis or sacroiliitis.  She had no recurrence of Planter fasciitis.  Plan: Secukinumab, 300 MG Dose, (COSENTYX SENSOREADY, 300 MG,) 150 MG/ML SOAJ  High risk medication use - Cosentyx 300 mg sq injections every 28 days, methotrexate 0.8 ml sq injections once weekly, and folic acid 2 mg by mouth daily.  -Labs obtained on March 15, 2021 were reviewed which were stable except for mild thrombocytosis.  TB gold was negative on September 22, 2020.  We will obtain labs today and every 3 months to monitor for drug toxicity.  She will get TB Gold with her next labs.  Plan: CBC with Differential/Platelet, COMPLETE METABOLIC PANEL WITH GFR, CBC with Differential/Platelet, COMPLETE METABOLIC PANEL WITH GFR.  Information regarding immunization was provided.  She was also advised to hold Cosentyx and methotrexate in case she develops an infection and resume after the infection resolves.  Sacroiliitis (HCC) -she had no tenderness in the SI joints today.    Episcleritis of both eyes - Dr. Fabian Sharp.  She started to have a flare of episcleritis in the left eye starting in January 2021 which resolved in July 2021.  She denies any recurrence of episcleritis.  Primary osteoarthritis of both hands -  X-rays of both hands were obtained on 09/22/2020 which were consistent with osteoarthritis.  No erosive changes were noted  Trochanteric bursitis of both  hips-she had no tenderness in the trochanteric region today.  She states she does have intermittent discomfort in the trochanteric bursa.  Primary osteoarthritis of both knees-she complains of increased pain and discomfort in her right knee joint to the point she is having difficulty walking.  No formal swelling or effusion was noted.  She has crepitus in her bilateral knee joints.  Patient requested a cortisone injection to her right knee joint.  After informed consent was obtained and side effects were discussed the right  knee joint was prepped in sterile fashion and injected with lidocaine and cortisone as described above.  She tolerated the procedure well.  Postprocedure instructions were given.  She was advised to contact us if her symptoms persist.  Primary osteoarthritis of both feet-she has off-and-on discomfort in her feet.  She states she had an injection for capsulitis in her right foot by the podiatrist and the symptoms resolved.  Plantar fasciitis, bilateral-she had no recent episodes of Planter fasciitis.  DDD (degenerative disc disease), lumbar-she has intermittent discomfort.  She denies discomfort today.  Other medical problems listed as follows:  History of fatty infiltration of liver  PCOS (polycystic ovarian syndrome)  Depression with anxiety  History of gastroesophageal reflux (GERD)  Essential hypertension  History of hypothyroidism  Recurrent UTI  Orders: Orders Placed This Encounter  Procedures   CBC with Differential/Platelet   COMPLETE METABOLIC PANEL WITH GFR   CBC with Differential/Platelet   COMPLETE METABOLIC PANEL WITH GFR   Meds ordered this encounter  Medications   Secukinumab, 300 MG Dose, (COSENTYX SENSOREADY, 300 MG,) 150 MG/ML SOAJ    Sig: Inject 300 mg into the skin every 28 (twenty-eight) days.    Dispense:  6 mL    Refill:  0     Follow-Up Instructions: Return in about 5 months (around 11/30/2021) for Spondyloarthropathy,  Osteoarthritis.   [name], MD  Note - This record has been created using Animal nutritionist.  Chart creation errors have been sought, but may not always  have been located. Such creation errors do not reflect on  the standard of medical care.

## 2021-06-30 ENCOUNTER — Encounter: Payer: Self-pay | Admitting: Rheumatology

## 2021-06-30 ENCOUNTER — Ambulatory Visit: Payer: PPO | Admitting: Rheumatology

## 2021-06-30 VITALS — BP 119/83 | HR 83 | Resp 13 | Ht 63.5 in | Wt 265.6 lb

## 2021-06-30 DIAGNOSIS — M47819 Spondylosis without myelopathy or radiculopathy, site unspecified: Secondary | ICD-10-CM

## 2021-06-30 DIAGNOSIS — H15103 Unspecified episcleritis, bilateral: Secondary | ICD-10-CM

## 2021-06-30 DIAGNOSIS — M461 Sacroiliitis, not elsewhere classified: Secondary | ICD-10-CM | POA: Diagnosis not present

## 2021-06-30 DIAGNOSIS — M5136 Other intervertebral disc degeneration, lumbar region: Secondary | ICD-10-CM

## 2021-06-30 DIAGNOSIS — M7062 Trochanteric bursitis, left hip: Secondary | ICD-10-CM

## 2021-06-30 DIAGNOSIS — M722 Plantar fascial fibromatosis: Secondary | ICD-10-CM | POA: Diagnosis not present

## 2021-06-30 DIAGNOSIS — M170 Bilateral primary osteoarthritis of knee: Secondary | ICD-10-CM | POA: Diagnosis not present

## 2021-06-30 DIAGNOSIS — M19071 Primary osteoarthritis, right ankle and foot: Secondary | ICD-10-CM | POA: Diagnosis not present

## 2021-06-30 DIAGNOSIS — M19041 Primary osteoarthritis, right hand: Secondary | ICD-10-CM

## 2021-06-30 DIAGNOSIS — F418 Other specified anxiety disorders: Secondary | ICD-10-CM

## 2021-06-30 DIAGNOSIS — E282 Polycystic ovarian syndrome: Secondary | ICD-10-CM | POA: Diagnosis not present

## 2021-06-30 DIAGNOSIS — Z8719 Personal history of other diseases of the digestive system: Secondary | ICD-10-CM | POA: Diagnosis not present

## 2021-06-30 DIAGNOSIS — Z79899 Other long term (current) drug therapy: Secondary | ICD-10-CM

## 2021-06-30 DIAGNOSIS — Z8639 Personal history of other endocrine, nutritional and metabolic disease: Secondary | ICD-10-CM

## 2021-06-30 DIAGNOSIS — M7061 Trochanteric bursitis, right hip: Secondary | ICD-10-CM | POA: Diagnosis not present

## 2021-06-30 DIAGNOSIS — M19072 Primary osteoarthritis, left ankle and foot: Secondary | ICD-10-CM

## 2021-06-30 DIAGNOSIS — N390 Urinary tract infection, site not specified: Secondary | ICD-10-CM

## 2021-06-30 DIAGNOSIS — M19042 Primary osteoarthritis, left hand: Secondary | ICD-10-CM

## 2021-06-30 DIAGNOSIS — I10 Essential (primary) hypertension: Secondary | ICD-10-CM

## 2021-06-30 MED ORDER — LIDOCAINE HCL 1 % IJ SOLN
1.5000 mL | INTRAMUSCULAR | Status: AC | PRN
  Administered 2021-06-30: 1.5 mL

## 2021-06-30 MED ORDER — COSENTYX SENSOREADY (300 MG) 150 MG/ML ~~LOC~~ SOAJ: 300.0000 mg | SUBCUTANEOUS | 0 refills | Status: DC

## 2021-06-30 MED ORDER — TRIAMCINOLONE ACETONIDE 40 MG/ML IJ SUSP
40.0000 mg | INTRAMUSCULAR | Status: AC | PRN
  Administered 2021-06-30: 40 mg via INTRA_ARTICULAR

## 2021-06-30 NOTE — Patient Instructions (Addendum)
Standing Labs We placed an order today for your standing lab work.   Please have your standing labs drawn in September and every 3 months Please get TB Gold with your next labs.  If possible, please have your labs drawn 2 weeks prior to your appointment so that the provider can discuss your results at your appointment.  Please note that you may see your imaging and lab results in MyChart before we have reviewed them. We may be awaiting multiple results to interpret others before contacting you. Please allow our office up to 72 hours to thoroughly review all of the results before contacting the office for clarification of your results.  We have open lab daily: Monday through Thursday from 1:30-4:30 PM and Friday from 1:30-4:00 PM at the office of Dr. Pollyann SavoyShaili Lyndia Bury, [facility].   Please be advised, all patients with office appointments requiring lab work will take precedent over walk-in lab work.  If possible, please come for your lab work on Monday and Friday afternoons, as you may experience shorter wait times. The office is located at [address], [address], [place], [place] [address] No appointment is necessary.   Labs are drawn by [facility]. Please bring your co-pay at the time of your lab draw.  You may receive a bill from [facility] for your lab work.  Please note if you are on Hydroxychloroquine and and an order has been placed for a Hydroxychloroquine level, you will need to have it drawn 4 hours or more after your last dose.  If you wish to have your labs drawn at another location, please call the office 24 hours in advance to send orders.  If you have any questions regarding directions or hours of operation,  please call [phone].   As a reminder, please drink plenty of water prior to coming for your lab work. Thanks!   Vaccines You are taking a medication(s) that can suppress your immune system.  The following immunizations are recommended: Flu  annually Covid-19  Td/Tdap (tetanus, diphtheria, pertussis) every 10 years Pneumonia (Prevnar 15 then Pneumovax 23 at least 1 year apart.  Alternatively, can take Prevnar 20 without needing additional dose) Shingrix: 2 doses from 4 weeks to 6 months apart  Please check with your PCP to make sure you are up to date.   If you have signs or symptoms of an infection or start antibiotics: First, call your PCP for workup of your infection. Hold your medication through the infection, until you complete your antibiotics, and until symptoms resolve if you take the following: Injectable medication (Actemra, Benlysta, Cimzia, Cosentyx, Enbrel, Humira, Kevzara, Orencia, Remicade, Simponi, Stelara, Taltz, Tremfya) Methotrexate Leflunomide (Arava) Mycophenolate (Cellcept) Harriette OharaXeljanz, Olumiant, or Rinvoq .

## 2021-07-01 LAB — CBC WITH DIFFERENTIAL/PLATELET
Absolute Monocytes: 630 cells/uL (ref 200–950)
Basophils Absolute: 63 cells/uL (ref 0–200)
Basophils Relative: 0.6 %
Eosinophils Absolute: 158 cells/uL (ref 15–500)
Eosinophils Relative: 1.5 %
HCT: 42.8 % (ref 35.0–45.0)
Hemoglobin: 14 g/dL (ref 11.7–15.5)
Lymphs Abs: 3749 cells/uL (ref 850–3900)
MCH: 29.9 pg (ref 27.0–33.0)
MCHC: 32.7 g/dL (ref 32.0–36.0)
MCV: 91.5 fL (ref 80.0–100.0)
MPV: 9.5 fL (ref 7.5–12.5)
Monocytes Relative: 6 %
Neutro Abs: 5901 cells/uL (ref 1500–7800)
Neutrophils Relative %: 56.2 %
Platelets: 448 10*3/uL — ABNORMAL HIGH (ref 140–400)
RBC: 4.68 10*6/uL (ref 3.80–5.10)
RDW: 13.8 % (ref 11.0–15.0)
Total Lymphocyte: 35.7 %
WBC: 10.5 10*3/uL (ref 3.8–10.8)

## 2021-07-01 LAB — COMPLETE METABOLIC PANEL WITH GFR
AG Ratio: 1.4 (calc) (ref 1.0–2.5)
ALT: 23 U/L (ref 6–29)
AST: 22 U/L (ref 10–30)
Albumin: 4.3 g/dL (ref 3.6–5.1)
Alkaline phosphatase (APISO): 102 U/L (ref 31–125)
BUN/Creatinine Ratio: 8 (calc) (ref 6–22)
BUN: 5 mg/dL — ABNORMAL LOW (ref 7–25)
CO2: 27 mmol/L (ref 20–32)
Calcium: 9.5 mg/dL (ref 8.6–10.2)
Chloride: 100 mmol/L (ref 98–110)
Creat: 0.64 mg/dL (ref 0.50–0.99)
Globulin: 3 g/dL (calc) (ref 1.9–3.7)
Glucose, Bld: 83 mg/dL (ref 65–99)
Potassium: 4.2 mmol/L (ref 3.5–5.3)
Sodium: 137 mmol/L (ref 135–146)
Total Bilirubin: 0.3 mg/dL (ref 0.2–1.2)
Total Protein: 7.3 g/dL (ref 6.1–8.1)
eGFR: 112 mL/min/{1.73_m2} (ref >=60)

## 2021-07-01 NOTE — Progress Notes (Signed)
CBC and CMP normal

## 2021-07-09 ENCOUNTER — Other Ambulatory Visit: Payer: Self-pay | Admitting: Rheumatology

## 2021-07-15 ENCOUNTER — Other Ambulatory Visit: Payer: Self-pay | Admitting: Rheumatology

## 2021-08-04 ENCOUNTER — Other Ambulatory Visit: Payer: Self-pay | Admitting: Rheumatology

## 2021-08-16 DIAGNOSIS — E1165 Type 2 diabetes mellitus with hyperglycemia: Secondary | ICD-10-CM | POA: Diagnosis not present

## 2021-08-16 DIAGNOSIS — Z7985 Long-term (current) use of injectable non-insulin antidiabetic drugs: Secondary | ICD-10-CM | POA: Diagnosis not present

## 2021-08-16 DIAGNOSIS — E039 Hypothyroidism, unspecified: Secondary | ICD-10-CM | POA: Diagnosis not present

## 2021-08-16 DIAGNOSIS — E785 Hyperlipidemia, unspecified: Secondary | ICD-10-CM | POA: Diagnosis not present

## 2021-08-16 DIAGNOSIS — Z79890 Hormone replacement therapy (postmenopausal): Secondary | ICD-10-CM | POA: Diagnosis not present

## 2021-08-16 DIAGNOSIS — Z7984 Long term (current) use of oral hypoglycemic drugs: Secondary | ICD-10-CM | POA: Diagnosis not present

## 2021-08-16 DIAGNOSIS — Z9884 Bariatric surgery status: Secondary | ICD-10-CM | POA: Diagnosis not present

## 2021-08-23 DIAGNOSIS — I10 Essential (primary) hypertension: Secondary | ICD-10-CM | POA: Diagnosis not present

## 2021-08-23 DIAGNOSIS — E1165 Type 2 diabetes mellitus with hyperglycemia: Secondary | ICD-10-CM | POA: Diagnosis not present

## 2021-08-23 DIAGNOSIS — E782 Mixed hyperlipidemia: Secondary | ICD-10-CM | POA: Diagnosis not present

## 2021-10-05 ENCOUNTER — Telehealth: Payer: PPO | Admitting: Physician Assistant

## 2021-10-05 DIAGNOSIS — J069 Acute upper respiratory infection, unspecified: Secondary | ICD-10-CM | POA: Diagnosis not present

## 2021-10-06 MED ORDER — BENZONATATE 100 MG PO CAPS: 100.0000 mg | ORAL_CAPSULE | Freq: Three times a day (TID) | ORAL | 0 refills | Status: DC | PRN

## 2021-10-06 NOTE — Progress Notes (Signed)
I have spent 5 minutes in review of e-visit questionnaire, review and updating patient chart, medical decision making and response to patient.   Tagan Bartram Cody Areta Terwilliger, PA-C    

## 2021-10-06 NOTE — Progress Notes (Signed)

## 2021-10-18 MED ORDER — METHOTREXATE 2.5 MG PO TABS: 20.0000 mg | ORAL_TABLET | ORAL | 0 refills | Status: DC

## 2021-10-18 NOTE — Telephone Encounter (Signed)
Next Visit: 11/30/2021  Last Visit: 06/30/2021  Last Fill:   DX: Spondyloarthropathy  Current Dose per office note 06/30/2021: methotrexate 0.8 ml sq injections once weekly  Labs: 06/30/2021 CBC and CMP normal.  Patient to update labs this week.   Okay to switch MTX to tablets and refill?

## 2021-10-21 ENCOUNTER — Other Ambulatory Visit: Payer: Self-pay

## 2021-10-21 DIAGNOSIS — Z111 Encounter for screening for respiratory tuberculosis: Secondary | ICD-10-CM

## 2021-10-21 DIAGNOSIS — Z79899 Other long term (current) drug therapy: Secondary | ICD-10-CM | POA: Diagnosis not present

## 2021-10-27 LAB — COMPLETE METABOLIC PANEL WITH GFR
AG Ratio: 1.3 (calc) (ref 1.0–2.5)
ALT: 31 U/L — ABNORMAL HIGH (ref 6–29)
AST: 23 U/L (ref 10–30)
Albumin: 4 g/dL (ref 3.6–5.1)
Alkaline phosphatase (APISO): 95 U/L (ref 31–125)
BUN/Creatinine Ratio: 10 (calc) (ref 6–22)
BUN: 6 mg/dL — ABNORMAL LOW (ref 7–25)
CO2: 28 mmol/L (ref 20–32)
Calcium: 9 mg/dL (ref 8.6–10.2)
Chloride: 100 mmol/L (ref 98–110)
Creat: 0.61 mg/dL (ref 0.50–0.99)
Globulin: 3 g/dL (calc) (ref 1.9–3.7)
Glucose, Bld: 153 mg/dL — ABNORMAL HIGH (ref 65–99)
Potassium: 3.9 mmol/L (ref 3.5–5.3)
Sodium: 135 mmol/L (ref 135–146)
Total Bilirubin: 0.3 mg/dL (ref 0.2–1.2)
Total Protein: 7 g/dL (ref 6.1–8.1)
eGFR: 113 mL/min/{1.73_m2} (ref >=60)

## 2021-10-27 LAB — QUANTIFERON-TB GOLD PLUS
Mitogen-NIL: >10 IU/mL
NIL: 0.03 IU/mL
QuantiFERON-TB Gold Plus: NEGATIVE
TB1-NIL: 0.01 IU/mL
TB2-NIL: 0 IU/mL

## 2021-10-27 LAB — CBC WITH DIFFERENTIAL/PLATELET
Absolute Monocytes: 521 cells/uL (ref 200–950)
Basophils Absolute: 76 cells/uL (ref 0–200)
Basophils Relative: 0.6 %
Eosinophils Absolute: 165 cells/uL (ref 15–500)
Eosinophils Relative: 1.3 %
HCT: 40.1 % (ref 35.0–45.0)
Hemoglobin: 13 g/dL (ref 11.7–15.5)
Lymphs Abs: 3162 cells/uL (ref 850–3900)
MCH: 29.9 pg (ref 27.0–33.0)
MCHC: 32.4 g/dL (ref 32.0–36.0)
MCV: 92.2 fL (ref 80.0–100.0)
MPV: 10 fL (ref 7.5–12.5)
Monocytes Relative: 4.1 %
Neutro Abs: 8776 cells/uL — ABNORMAL HIGH (ref 1500–7800)
Neutrophils Relative %: 69.1 %
Platelets: 425 10*3/uL — ABNORMAL HIGH (ref 140–400)
RBC: 4.35 10*6/uL (ref 3.80–5.10)
RDW: 13.1 % (ref 11.0–15.0)
Total Lymphocyte: 24.9 %
WBC: 12.7 10*3/uL — ABNORMAL HIGH (ref 3.8–10.8)

## 2021-10-27 NOTE — Progress Notes (Signed)
TB Gold is negative.

## 2021-10-28 ENCOUNTER — Other Ambulatory Visit: Payer: Self-pay | Admitting: Rheumatology

## 2021-10-28 DIAGNOSIS — M47819 Spondylosis without myelopathy or radiculopathy, site unspecified: Secondary | ICD-10-CM

## 2021-10-28 DIAGNOSIS — M461 Sacroiliitis, not elsewhere classified: Secondary | ICD-10-CM

## 2021-10-28 MED ORDER — COSENTYX SENSOREADY (300 MG) 150 MG/ML ~~LOC~~ SOAJ: 300.0000 mg | SUBCUTANEOUS | 0 refills | Status: DC

## 2021-10-28 NOTE — Telephone Encounter (Signed)
Next Visit: 11/30/2021  Last Visit: 06/30/2021  Last Fill: 06/30/2021  YB:FXOVANVBTYOMAYOKHTX  Current Dose per office note 06/30/2021: Cosentyx 300 mg sq injections every 28 days  Labs: 10/21/2021 Glucose is elevated-153. ALT is borderline elevated-avoid the use of tylenol and alcohol use. CBC stable.   TB Gold: 10/21/2021   Okay to refill Cosentyx?

## 2021-11-18 NOTE — Progress Notes (Deleted)
Office Visit Note  Patient: Allison Atkinson             Date of Birth: 1975-12-20           MRN: [id number]             PCP: Asencion Noble, MD Referring: Asencion Noble, MD Visit Date: 11/30/2021 Occupation: @GUAROCC @  Subjective:  No chief complaint on file.   History of Present Illness: Allison Atkinson is a 46 y.o. female ***   Activities of Daily Living:  Patient reports morning stiffness for *** {minute/hour:19697}.   Patient {ACTIONS;DENIES/REPORTS:21021675::"Denies"} nocturnal pain.  Difficulty dressing/grooming: {ACTIONS;DENIES/REPORTS:21021675::"Denies"} Difficulty climbing stairs: {ACTIONS;DENIES/REPORTS:21021675::"Denies"} Difficulty getting out of chair: {ACTIONS;DENIES/REPORTS:21021675::"Denies"} Difficulty using hands for taps, buttons, cutlery, and/or writing: {ACTIONS;DENIES/REPORTS:21021675::"Denies"}  No Rheumatology ROS completed.   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   Cervical spondylosis without myelopathy 10/25/2017   Obesity 07/04/2017   Type 2 diabetes mellitus with hyperglycemia, without long-term current use of insulin ([place]) 03/30/2017   Mixed dyslipidemia 08/16/2016   Vitamin D insufficiency 08/16/2016   High risk medication use 03/30/2016   Acute midline low back pain without sciatica 03/30/2016   Primary osteoarthritis of both knees 01/12/2016   Spondylosis of lumbar region without myelopathy or radiculopathy 01/12/2016   Sacroiliitis, not elsewhere classified ([place]) 01/12/2016   Chronic diarrhea 01/12/2016   History of gastroesophageal reflux (GERD)  01/12/2016   History of fatty infiltration of liver 01/12/2016   Hypertension 05/09/2013   Palpitations 04/11/2013   Spondyloarthropathy ([place]) 10/24/2009   PCOS (polycystic ovarian syndrome) 01/25/2003   Depression with anxiety 01/25/2003   GERD (gastroesophageal reflux disease) 01/25/2003   Hypothyroidism 01/24/2001    Past Medical History:  Diagnosis Date   Anxiety    Bipolar disorder ([place])    Diabetes mellitus without complication ([place]) 63/7858   Fatty liver disease, nonalcoholic    History of cholecystectomy    Hypertension    Polycystic ovarian disease    RA (rheumatoid arthritis) ([place])    Thyroid disease    hypothyroid    Family History  Problem Relation Age of Onset   Hypertension Mother    Alcohol abuse Father    Past Surgical History:  Procedure Laterality Date   CHOLECYSTECTOMY  1999   COLONOSCOPY W/ BIOPSIES  2010   ESOPHAGOGASTRODUODENOSCOPY  2010   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   Procedure: LAPAROSCOPIC ROUX-EN-Y GASTRIC BYPASS WITH UPPER ENDOSCOPY;  Surgeon: Kieth Brightly, Arta Bruce, MD;  Location: [facility];  Service: General;  Laterality: N/A;   KNEE ARTHROPLASTY     KNEE ARTHROSCOPY Right    Social History   Social History Narrative   Not on file   Immunization History  Administered Date(s) Administered   Influenza,inj,Quad PF,6+ Mos 10/19/2017   Influenza-Unspecified 11/13/2012, 10/19/2017   Moderna Sars-Covid-2 Vaccination 04/25/2019, 05/23/2019, 12/12/2019, 05/23/2020   Pneumococcal Polysaccharide-23 10/24/2008   Tdap 06/28/2018     Objective: Vital Signs: There were no vitals taken for this visit.   Physical Exam   Musculoskeletal Exam: ***  CDAI Exam: CDAI Score: -- Patient Global: --; Provider Global: --  Swollen: --; Tender: -- Joint Exam 11/30/2021   No joint exam has been documented for this visit   There is currently no information documented on the homunculus. Go to the Rheumatology activity and complete the homunculus joint  exam.  Investigation: No additional findings.  Imaging: No results found.  Recent Labs: Lab Results  Component Value Date   WBC 12.7 (H) 10/21/2021   HGB 13.0 10/21/2021   PLT 425 (H) 10/21/2021   NA 135 10/21/2021   K 3.9 10/21/2021   CL 100 10/21/2021   CO2 28 10/21/2021   GLUCOSE 153 (H) 10/21/2021   BUN 6 (L) 10/21/2021   CREATININE 0.61 10/21/2021   BILITOT 0.3 10/21/2021   ALKPHOS 60 07/05/2017   AST 23 10/21/2021   ALT 31 (H) 10/21/2021   PROT 7.0 10/21/2021   ALBUMIN 3.9 07/05/2017   CALCIUM 9.0 10/21/2021   GFRAA 129 03/05/2020   QFTBGOLDPLUS NEGATIVE 10/21/2021    Speciality Comments: Patient was on Humira in the past.  Patient had an adequate response to Humira and was switched to Cosentyx.  Procedures:  No procedures performed Allergies: Risperdal [risperidone] and Latex   Assessment / Plan:     Visit Diagnoses: No diagnosis found.  Orders: No orders of the defined types were placed in this encounter.  No orders of the defined types were placed in this encounter.   Face-to-face time spent with patient was *** minutes. Greater than 50% of time was spent in counseling and coordination of care.  Follow-Up Instructions: No follow-ups on file.   Ellen Henri, CMA  Note - This record has been created using Animal nutritionist.  Chart creation errors have been sought, but may not always  have been located. Such creation errors do not reflect on  the standard of medical care.

## 2021-11-30 ENCOUNTER — Ambulatory Visit: Payer: PPO | Admitting: Physician Assistant

## 2021-11-30 DIAGNOSIS — F418 Other specified anxiety disorders: Secondary | ICD-10-CM

## 2021-11-30 DIAGNOSIS — M7062 Trochanteric bursitis, left hip: Secondary | ICD-10-CM

## 2021-11-30 DIAGNOSIS — M19071 Primary osteoarthritis, right ankle and foot: Secondary | ICD-10-CM

## 2021-11-30 DIAGNOSIS — M461 Sacroiliitis, not elsewhere classified: Secondary | ICD-10-CM

## 2021-11-30 DIAGNOSIS — Z8719 Personal history of other diseases of the digestive system: Secondary | ICD-10-CM

## 2021-11-30 DIAGNOSIS — H15103 Unspecified episcleritis, bilateral: Secondary | ICD-10-CM

## 2021-11-30 DIAGNOSIS — Z79899 Other long term (current) drug therapy: Secondary | ICD-10-CM

## 2021-11-30 DIAGNOSIS — M47819 Spondylosis without myelopathy or radiculopathy, site unspecified: Secondary | ICD-10-CM

## 2021-11-30 DIAGNOSIS — I10 Essential (primary) hypertension: Secondary | ICD-10-CM

## 2021-11-30 DIAGNOSIS — N390 Urinary tract infection, site not specified: Secondary | ICD-10-CM

## 2021-11-30 DIAGNOSIS — E282 Polycystic ovarian syndrome: Secondary | ICD-10-CM

## 2021-11-30 DIAGNOSIS — M5136 Other intervertebral disc degeneration, lumbar region: Secondary | ICD-10-CM

## 2021-11-30 DIAGNOSIS — M170 Bilateral primary osteoarthritis of knee: Secondary | ICD-10-CM

## 2021-11-30 DIAGNOSIS — M19041 Primary osteoarthritis, right hand: Secondary | ICD-10-CM

## 2021-11-30 DIAGNOSIS — Z8639 Personal history of other endocrine, nutritional and metabolic disease: Secondary | ICD-10-CM

## 2021-11-30 DIAGNOSIS — M722 Plantar fascial fibromatosis: Secondary | ICD-10-CM

## 2021-12-08 ENCOUNTER — Encounter: Payer: Self-pay | Admitting: Rheumatology

## 2021-12-09 ENCOUNTER — Telehealth: Payer: Self-pay | Admitting: Podiatry

## 2021-12-09 NOTE — Telephone Encounter (Signed)
Pt sent mychart message for an appt with Dr Al Corpus.  I called pt and left message for pt to call to schedule an appt.

## 2021-12-14 ENCOUNTER — Other Ambulatory Visit: Payer: Self-pay | Admitting: Physician Assistant

## 2021-12-14 NOTE — Telephone Encounter (Signed)
Next Visit: 01/12/2022   Last Visit: 06/30/2021   Last Fill: 10/05/2020  Dx: DDD (degenerative disc disease), lumbar   Current Dose per office note on 06/30/2021: not discussed  Okay to refill Methocarbamol?

## 2021-12-21 ENCOUNTER — Telehealth: Payer: Self-pay

## 2021-12-21 NOTE — Telephone Encounter (Signed)
LVM with patient to confirm she has received the COSENTYX renewal paperwork.   Georgeann Oppenheim [employer] PharmD Candidate [phone]

## 2021-12-28 ENCOUNTER — Ambulatory Visit: Payer: PPO | Admitting: Podiatry

## 2021-12-30 NOTE — Progress Notes (Signed)
Office Visit Note  Patient: Allison Atkinson             Date of Birth: [birth year]           MRN: [id number]             PCP: Carylon Perches, MD Referring: Carylon Perches, MD Visit Date: 01/12/2022 Occupation: @GUAROCC @  Subjective:  Medication monitoring   History of Present Illness: Allison Atkinson is a 46 y.o. female with history of spondyloarthropathy and osteoarthritis.  Patient remains on Cosentyx 300 mg sq injections every 28 days, methotrexate 8 tablets once weekly, and folic acid 2 mg by mouth daily.  She is tolerating combination therapy without any side effects.  She denies any recent gaps in therapy.  Patient reports that she has been experiencing some increased arthralgias and joint stiffness today which she attributes to being more physically active yesterday.  She denies any joint swelling at this time.  She states that her discomfort is most severe in the lumbar spine, right knee, and right second toe.  She is scheduled for an upcoming appointment with Dr. Al Corpus in January 2024 for right second toe injection which previously alleviated her symptoms.  She took Aleve today which alleviated her discomfort. She denies any recent or recurrent infections. Patient reports that she recently noticed a lump in her right breast and was evaluated by her PCP.  She is scheduled for diagnostic mammogram on 01/18/2022 for further evaluation.    Activities of Daily Living:  Patient reports morning stiffness for 2-3 hours.   Patient Reports nocturnal pain.  Difficulty dressing/grooming: Reports Difficulty climbing stairs: Reports Difficulty getting out of chair: Reports Difficulty using hands for taps, buttons, cutlery, and/or writing: Denies  Review of Systems  Constitutional:  Positive for fatigue.  HENT:  Negative for mouth sores, mouth dryness and nose dryness.   Eyes:  Negative for pain, visual disturbance and dryness.  Respiratory:  Negative for cough, hemoptysis, shortness of breath and  difficulty breathing.   Cardiovascular:  Negative for chest pain, palpitations, hypertension and swelling in legs/feet.  Gastrointestinal:  Positive for diarrhea. Negative for blood in stool and constipation.  Endocrine: Negative for increased urination.  Genitourinary:  Negative for painful urination and involuntary urination.  Musculoskeletal:  Positive for joint pain, gait problem, joint pain, joint swelling, myalgias, muscle weakness, morning stiffness, muscle tenderness and myalgias.  Skin:  Positive for hair loss and sensitivity to sunlight. Negative for color change, pallor, rash, nodules/bumps, skin tightness and ulcers.  Allergic/Immunologic: Negative for susceptible to infections.  Neurological:  Positive for dizziness and headaches. Negative for numbness and weakness.  Hematological:  Negative for swollen glands.  Psychiatric/Behavioral:  Positive for depressed mood and sleep disturbance. The patient is nervous/anxious.     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   Cervical spondylosis without myelopathy 10/25/2017   Obesity 07/04/2017   Type 2 diabetes mellitus with hyperglycemia, without long-term current use of insulin (HCC) 03/30/2017   Mixed dyslipidemia 08/16/2016   Vitamin D insufficiency 08/16/2016   High risk medication use 03/30/2016  Acute midline low back pain without sciatica 03/30/2016   Primary osteoarthritis of both knees 01/12/2016   Spondylosis of lumbar region without myelopathy or radiculopathy 01/12/2016   Sacroiliitis, not elsewhere classified (HCC) 01/12/2016    Chronic diarrhea 01/12/2016   History of gastroesophageal reflux (GERD) 01/12/2016   History of fatty infiltration of liver 01/12/2016   Hypertension 05/09/2013   Palpitations 04/11/2013   Spondyloarthropathy (HCC) 10/24/2009   PCOS (polycystic ovarian syndrome) 01/25/2003   Depression with anxiety 01/25/2003   GERD (gastroesophageal reflux disease) 01/25/2003   Hypothyroidism 01/24/2001    Past Medical History:  Diagnosis Date   Anxiety    Bipolar disorder (HCC)    Diabetes mellitus without complication (HCC) 05/2017   Fatty liver disease, nonalcoholic    History of cholecystectomy    Hypertension    Polycystic ovarian disease    RA (rheumatoid arthritis) (HCC)    Thyroid disease    hypothyroid    Family History  Problem Relation Age of Onset   Hypertension Mother    Alcohol abuse Father    Past Surgical History:  Procedure Laterality Date   CHOLECYSTECTOMY  1999   COLONOSCOPY W/ BIOPSIES  2010   ESOPHAGOGASTRODUODENOSCOPY  2010   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   Procedure: LAPAROSCOPIC ROUX-EN-Y GASTRIC BYPASS WITH UPPER ENDOSCOPY;  Surgeon: Rodman Pickle, MD;  Location: [facility];  Service: General;  Laterality: N/A;   KNEE ARTHROPLASTY     KNEE ARTHROSCOPY Right    Social History   Social History Narrative   Not on file   Immunization History  Administered Date(s) Administered   Influenza,inj,Quad PF,6+ Mos 10/19/2017   Influenza-Unspecified 11/13/2012, 10/19/2017   Moderna Sars-Covid-2 Vaccination 04/25/2019, 05/23/2019, 12/12/2019, 05/23/2020   Pneumococcal Polysaccharide-23 10/24/2008   Tdap 06/28/2018     Objective: Vital Signs: BP 137/85 (BP Location: Left Arm, Patient Position: Sitting, Cuff Size: Normal)   Pulse 96   Resp 16   Ht 5' 3.5" (1.613 m)   Wt 266 lb 12.8 oz (121 kg)   BMI 46.52 kg/m    Physical Exam Vitals and nursing note reviewed.  Constitutional:      Appearance: She is well-developed.  HENT:     Head: Normocephalic  and atraumatic.  Eyes:     Conjunctiva/sclera: Conjunctivae normal.  Cardiovascular:     Rate and Rhythm: Normal rate and regular rhythm.     Heart sounds: Normal heart sounds.  Pulmonary:     Effort: Pulmonary effort is normal.     Breath sounds: Normal breath sounds.  Abdominal:     General: Bowel sounds are normal.     Palpations: Abdomen is soft.  Musculoskeletal:     Cervical back: Normal range of motion.  Lymphadenopathy:     Cervical: No cervical adenopathy.  Skin:    General: Skin is warm and dry.     Capillary Refill: Capillary refill takes less than 2 seconds.  Neurological:     Mental Status: She is alert and oriented to person, place, and time.  Psychiatric:        Behavior: Behavior normal.      Musculoskeletal Exam: C-spine has good range of motion.  Midline spinal tenderness in the lumbar region.  No SI joint tenderness upon palpation.  Shoulder joints, elbow joints, wrist joints, MCPs, PIPs, DIPs have good range of motion with no synovitis.  Complete fist formation bilaterally.  Hip joints have good range of motion with no groin pain.  Knee joints have good range of  motion with some discomfort in the right knee.  No warmth or effusion noted.  Ankle joints have good range of motion with no tenderness or synovitis.  No evidence of Achilles tendinitis or plantar fasciitis.  She has tenderness palpation over the right second MTP joint.  CDAI Exam: CDAI Score: -- Patient Global: --; Provider Global: -- Swollen: --; Tender: -- Joint Exam 01/12/2022   No joint exam has been documented for this visit   There is currently no information documented on the homunculus. Go to the Rheumatology activity and complete the homunculus joint exam.  Investigation: No additional findings.  Imaging: No results found.  Recent Labs: Lab Results  Component Value Date   WBC 12.7 (H) 10/21/2021   HGB 13.0 10/21/2021   PLT 425 (H) 10/21/2021   NA 135 10/21/2021   K 3.9  10/21/2021   CL 100 10/21/2021   CO2 28 10/21/2021   GLUCOSE 153 (H) 10/21/2021   BUN 6 (L) 10/21/2021   CREATININE 0.61 10/21/2021   BILITOT 0.3 10/21/2021   ALKPHOS 60 07/05/2017   AST 23 10/21/2021   ALT 31 (H) 10/21/2021   PROT 7.0 10/21/2021   ALBUMIN 3.9 07/05/2017   CALCIUM 9.0 10/21/2021   GFRAA 129 03/05/2020   QFTBGOLDPLUS NEGATIVE 10/21/2021    Speciality Comments: Patient was on Humira in the past.  Patient had an adequate response to Humira and was switched to Cosentyx.  Procedures:  No procedures performed Allergies: Risperdal [risperidone] and Latex   Assessment / Plan:     Visit Diagnoses: Spondyloarthropathy: She has no synovitis or dactylitis on examination today.  Overall she has clinically been doing well on Cosentyx 300 mg sq injections every 28 days methotrexate 8 tablets by mouth once weekly and folic acid 2 mg daily.  She has not noticed any new or worsening symptoms since having to switch from injectable methotrexate to oral methotrexate due to the prescription shortage.  She experiences intermittent arthralgias and joint stiffness but has not had any inflammation.  She has no evidence of Achilles tendinitis or plantar fasciitis.  No SI joint tenderness.  No recent flares of episcleritis.  She presents today with increased discomfort in the lumbar spine and her right knee joint.  On exam she has good range of motion of the right knee with no warmth or effusion.  She has some midline spinal tenderness in the lumbar region.  No symptoms of radiculopathy.  She was given a Salonpas patch to apply topically for pain relief.  Her symptoms improved today after taking Aleve for pain relief.  No medication changes will be made at this time.  She will remain on Cosentyx and methotrexate as combination therapy.  She was advised to notify us if she develops signs or symptoms of a flare.  High risk medication use - Cosentyx 300 mg sq injections every 28 days, methotrexate 8  tablets by mouth once weekly, and folic acid 2 mg by mouth daily.  CBC and CMP drawn on 10/21/21. Orders for CBC and CMP released today.  Her next lab work will be due in March and every 3 months.  TB gold negative on 10/21/21.  Discussed the importance of holding cosentyx and MTX if she develops signs or symptoms of an infection and to resume once the infection has completely cleared.  - Plan: COMPLETE METABOLIC PANEL WITH GFR, CBC with Differential/Platelet  Sacroiliitis (HCC): She has no SI joint tenderness upon palpation.   Episcleritis of both eyes - Dr. Lorin Picket  [place]. Flare of episcleritis in the left eye starting in January 2021- resolved in July 2021.  She has not had any signs or symptoms of a flare recently.   Primary osteoarthritis of both hands - X-rays of both hands were obtained on 09/22/2020 which were consistent with osteoarthritis.  No erosive changes were noted.  No tenderness or synovitis noted.  Complete fist formation bilaterally.  Trochanteric bursitis of both hips: She has some tenderness palpation over the right trochanteric bursa.  Primary osteoarthritis of both knees: Both knee joints have good range of motion with some discomfort in the right knee.  No warmth or effusion noted.  Primary osteoarthritis of both feet: Followed by Dr. Al Corpus.  She presents today with increased discomfort in the right second toe.  She has tenderness palpation over the right second MTP joint.  She is scheduled to follow-up with Dr. Al Corpus in January 2024 at which time she plans on having a cortisone injection performed if her symptoms persist.  Ankle joints have good range of motion with no tenderness or synovitis.  No evidence of Achilles tendinitis or plantar fasciitis.  Plantar fasciitis, bilateral: Resolved.  No recurrence.  DDD (degenerative disc disease), lumbar: Patient presents today with increased discomfort in her lower back.  She has some midline spinal tenderness in the lumbar region.   No symptoms of radiculopathy.  She attributes her increased discomfort in her lower back to being more physically active yesterday.  She was given a Salonpas patch sample to apply topically for pain relief.  Mass of right breast, unspecified quadrant: Patient found a lump on her right breast recently and was evaluated by her PCP.  She is now scheduled for a diagnostic mammogram on 01/18/2022 for further evaluation.  She was advised to notify us of the results or have the report forwarded to our office to review.  Other medical conditions are listed as follows:   Depression with anxiety  PCOS (polycystic ovarian syndrome)  History of fatty infiltration of liver  Essential hypertension: BP was 137/85 today in the office.   History of hypothyroidism  History of gastroesophageal reflux (GERD)  Recurrent UTI: She has not had any recent UTI.  Orders: Orders Placed This Encounter  Procedures   COMPLETE METABOLIC PANEL WITH GFR   CBC with Differential/Platelet   Meds ordered this encounter  Medications   folic acid (FOLVITE) 1 MG tablet    Sig: Take 2 tablets (2 mg total) by mouth daily.    Dispense:  180 tablet    Refill:  3     Follow-Up Instructions: Return in about 5 months (around 06/13/2022) for Spondyloarthropathy, Osteoarthritis.   Gearldine Bienenstock, PA-C  Note - This record has been created using Dragon software.  Chart creation errors have been sought, but may not always  have been located. Such creation errors do not reflect on  the standard of medical care.

## 2021-12-30 NOTE — Telephone Encounter (Signed)
Patient left VM. She states she will bring her [employer] PAP renewal application for Cosentyx to [facility] on 01/12/22 with income documents  Chesley Mires, PharmD, MPH, BCPS, CPP Clinical Pharmacist (Rheumatology and Pulmonology)

## 2022-01-03 ENCOUNTER — Other Ambulatory Visit (HOSPITAL_COMMUNITY): Payer: Self-pay | Admitting: Internal Medicine

## 2022-01-03 DIAGNOSIS — N6310 Unspecified lump in the right breast, unspecified quadrant: Secondary | ICD-10-CM | POA: Diagnosis not present

## 2022-01-12 ENCOUNTER — Ambulatory Visit: Payer: PPO | Attending: Physician Assistant | Admitting: Physician Assistant

## 2022-01-12 ENCOUNTER — Encounter: Payer: Self-pay | Admitting: Physician Assistant

## 2022-01-12 VITALS — BP 137/85 | HR 96 | Resp 16 | Ht 63.5 in | Wt 266.8 lb

## 2022-01-12 DIAGNOSIS — M7061 Trochanteric bursitis, right hip: Secondary | ICD-10-CM | POA: Diagnosis not present

## 2022-01-12 DIAGNOSIS — M19041 Primary osteoarthritis, right hand: Secondary | ICD-10-CM | POA: Diagnosis not present

## 2022-01-12 DIAGNOSIS — F418 Other specified anxiety disorders: Secondary | ICD-10-CM

## 2022-01-12 DIAGNOSIS — N6310 Unspecified lump in the right breast, unspecified quadrant: Secondary | ICD-10-CM

## 2022-01-12 DIAGNOSIS — H15103 Unspecified episcleritis, bilateral: Secondary | ICD-10-CM | POA: Diagnosis not present

## 2022-01-12 DIAGNOSIS — E282 Polycystic ovarian syndrome: Secondary | ICD-10-CM | POA: Diagnosis not present

## 2022-01-12 DIAGNOSIS — Z8719 Personal history of other diseases of the digestive system: Secondary | ICD-10-CM

## 2022-01-12 DIAGNOSIS — M170 Bilateral primary osteoarthritis of knee: Secondary | ICD-10-CM | POA: Diagnosis not present

## 2022-01-12 DIAGNOSIS — M47819 Spondylosis without myelopathy or radiculopathy, site unspecified: Secondary | ICD-10-CM

## 2022-01-12 DIAGNOSIS — M722 Plantar fascial fibromatosis: Secondary | ICD-10-CM | POA: Diagnosis not present

## 2022-01-12 DIAGNOSIS — M461 Sacroiliitis, not elsewhere classified: Secondary | ICD-10-CM | POA: Diagnosis not present

## 2022-01-12 DIAGNOSIS — Z79899 Other long term (current) drug therapy: Secondary | ICD-10-CM

## 2022-01-12 DIAGNOSIS — I10 Essential (primary) hypertension: Secondary | ICD-10-CM

## 2022-01-12 DIAGNOSIS — M5136 Other intervertebral disc degeneration, lumbar region: Secondary | ICD-10-CM | POA: Diagnosis not present

## 2022-01-12 DIAGNOSIS — M19071 Primary osteoarthritis, right ankle and foot: Secondary | ICD-10-CM | POA: Diagnosis not present

## 2022-01-12 DIAGNOSIS — M19072 Primary osteoarthritis, left ankle and foot: Secondary | ICD-10-CM

## 2022-01-12 DIAGNOSIS — M7062 Trochanteric bursitis, left hip: Secondary | ICD-10-CM

## 2022-01-12 DIAGNOSIS — Z8639 Personal history of other endocrine, nutritional and metabolic disease: Secondary | ICD-10-CM

## 2022-01-12 DIAGNOSIS — M51369 Other intervertebral disc degeneration, lumbar region without mention of lumbar back pain or lower extremity pain: Secondary | ICD-10-CM

## 2022-01-12 DIAGNOSIS — M19042 Primary osteoarthritis, left hand: Secondary | ICD-10-CM

## 2022-01-12 DIAGNOSIS — N390 Urinary tract infection, site not specified: Secondary | ICD-10-CM

## 2022-01-12 MED ORDER — FOLIC ACID 1 MG PO TABS: 2.0000 mg | ORAL_TABLET | Freq: Every day | ORAL | 3 refills | Status: AC

## 2022-01-12 NOTE — Patient Instructions (Signed)
Standing Labs We placed an order today for your standing lab work.   Please have your standing labs drawn in March and every 3 months  Please have your labs drawn 2 weeks prior to your appointment so that the provider can discuss your lab results at your appointment.  Please note that you may see your imaging and lab results in MyChart before we have reviewed them. We will contact you once all results are reviewed. Please allow our office up to 72 hours to thoroughly review all of the results before contacting the office for clarification of your results.  Lab hours are:   Monday through Thursday from 8:00 am -12:30 pm and 1:00 pm-5:00 pm and Friday from 8:00 am-12:00 pm.  Please be advised, all patients with office appointments requiring lab work will take precedent over walk-in lab work.   Labs are drawn by [facility]. Please bring your co-pay at the time of your lab draw.  You may receive a bill from [facility] for your lab work.  Please note if you are on Hydroxychloroquine and and an order has been placed for a Hydroxychloroquine level, you will need to have it drawn 4 hours or more after your last dose.  If you wish to have your labs drawn at another location, please call the office 24 hours in advance so we can fax the orders.  The office is located at [address], [address], [place], [place] [address] No appointment is necessary.    If you have any questions regarding directions or hours of operation,  please call [phone].   As a reminder, please drink plenty of water prior to coming for your lab work. Thanks!  

## 2022-01-13 LAB — COMPLETE METABOLIC PANEL WITH GFR
AG Ratio: 1.4 (calc) (ref 1.0–2.5)
ALT: 23 U/L (ref 6–29)
AST: 19 U/L (ref 10–30)
Albumin: 4.1 g/dL (ref 3.6–5.1)
Alkaline phosphatase (APISO): 104 U/L (ref 31–125)
BUN/Creatinine Ratio: 9 (calc) (ref 6–22)
BUN: 6 mg/dL — ABNORMAL LOW (ref 7–25)
CO2: 26 mmol/L (ref 20–32)
Calcium: 9.1 mg/dL (ref 8.6–10.2)
Chloride: 100 mmol/L (ref 98–110)
Creat: 0.65 mg/dL (ref 0.50–0.99)
Globulin: 3 g/dL (calc) (ref 1.9–3.7)
Glucose, Bld: 200 mg/dL — ABNORMAL HIGH (ref 65–99)
Potassium: 3.9 mmol/L (ref 3.5–5.3)
Sodium: 136 mmol/L (ref 135–146)
Total Bilirubin: 0.3 mg/dL (ref 0.2–1.2)
Total Protein: 7.1 g/dL (ref 6.1–8.1)
eGFR: 111 mL/min/{1.73_m2} (ref >=60)

## 2022-01-13 LAB — CBC WITH DIFFERENTIAL/PLATELET
Absolute Monocytes: 563 cells/uL (ref 200–950)
Basophils Absolute: 49 cells/uL (ref 0–200)
Basophils Relative: 0.5 %
Eosinophils Absolute: 213 cells/uL (ref 15–500)
Eosinophils Relative: 2.2 %
HCT: 40 % (ref 35.0–45.0)
Hemoglobin: 13.4 g/dL (ref 11.7–15.5)
Lymphs Abs: 2561 cells/uL (ref 850–3900)
MCH: 30.2 pg (ref 27.0–33.0)
MCHC: 33.5 g/dL (ref 32.0–36.0)
MCV: 90.3 fL (ref 80.0–100.0)
MPV: 9.5 fL (ref 7.5–12.5)
Monocytes Relative: 5.8 %
Neutro Abs: 6315 cells/uL (ref 1500–7800)
Neutrophils Relative %: 65.1 %
Platelets: 461 10*3/uL — ABNORMAL HIGH (ref 140–400)
RBC: 4.43 10*6/uL (ref 3.80–5.10)
RDW: 13.4 % (ref 11.0–15.0)
Total Lymphocyte: 26.4 %
WBC: 9.7 10*3/uL (ref 3.8–10.8)

## 2022-01-13 NOTE — Telephone Encounter (Signed)
Patient called back with insurance information: RxBIN: [id number] RxPCN: [id number] RxGroup: [id number] ID: [id number]  Chesley Mires, PharmD, MPH, BCPS, CPP Clinical Pharmacist (Rheumatology and Pulmonology)

## 2022-01-13 NOTE — Telephone Encounter (Signed)
Patient dropped her [employer] Cosentyx PAP application with income documents. Pending provider sig.  Need to submit PA renewal to [employer] but her card in Epic is outdated. I called patient to acquire new information. She will call me back in 10 minutes since she is currently driving  Chesley Mires, PharmD, MPH, BCPS, CPP Clinical Pharmacist (Rheumatology and Pulmonology)

## 2022-01-13 NOTE — Progress Notes (Signed)
Glucose is elevated-200.  Rest of CMP WNL.  Plt count remains elevated but stable. Rest of CBC WNL.

## 2022-01-14 NOTE — Telephone Encounter (Signed)
Submitted a Prior Authorization RENEWAL request to  [employer]  for COSENTYX via CoverMyMeds. Will update once we receive a response.  Key: [id number]  Chesley Mires, PharmD, MPH, BCPS, CPP Clinical Pharmacist (Rheumatology and Pulmonology)

## 2022-01-18 ENCOUNTER — Ambulatory Visit (HOSPITAL_COMMUNITY): Payer: PPO

## 2022-01-18 ENCOUNTER — Inpatient Hospital Stay (HOSPITAL_COMMUNITY): Admission: RE | Admit: 2022-01-18 | Payer: PPO | Source: Ambulatory Visit

## 2022-01-20 ENCOUNTER — Other Ambulatory Visit (HOSPITAL_COMMUNITY): Payer: Self-pay

## 2022-01-20 NOTE — Telephone Encounter (Signed)
PA renewal for Cosentyx did not send to [employer]. Renewed auth request but even this new request does not appear to send to [employer]. Called [employer] to complete PA on form. First rep told me that the patient does not have a Medicare plan. Called plan again to talk to a new rep. Rep able to initiate PA on phone. Sent clinicals via fax today.  Key: [id number]  [employer] Phone: [phone] Fax: [phone] Request # [id number]  Loaded PAP application into Onbase in case we receive determination tomorrow when I am working remotely.  Chesley Mires, PharmD, MPH, BCPS, CPP Clinical Pharmacist (Rheumatology and Pulmonology)

## 2022-01-21 ENCOUNTER — Other Ambulatory Visit: Payer: Self-pay

## 2022-01-21 ENCOUNTER — Telehealth: Payer: PPO | Admitting: Physician Assistant

## 2022-01-21 DIAGNOSIS — M47819 Spondylosis without myelopathy or radiculopathy, site unspecified: Secondary | ICD-10-CM

## 2022-01-21 DIAGNOSIS — R6889 Other general symptoms and signs: Secondary | ICD-10-CM | POA: Diagnosis not present

## 2022-01-21 DIAGNOSIS — M461 Sacroiliitis, not elsewhere classified: Secondary | ICD-10-CM

## 2022-01-21 MED ORDER — PROMETHAZINE-DM 6.25-15 MG/5ML PO SYRP: 5.0000 mL | ORAL_SOLUTION | Freq: Four times a day (QID) | ORAL | 0 refills | Status: DC | PRN

## 2022-01-21 MED ORDER — COSENTYX SENSOREADY (300 MG) 150 MG/ML ~~LOC~~ SOAJ: 300.0000 mg | SUBCUTANEOUS | 0 refills | Status: DC

## 2022-01-21 MED ORDER — FLUTICASONE PROPIONATE 50 MCG/ACT NA SUSP: 2.0000 | Freq: Every day | NASAL | 0 refills | Status: DC

## 2022-01-21 MED ORDER — BENZONATATE 100 MG PO CAPS: 100.0000 mg | ORAL_CAPSULE | Freq: Three times a day (TID) | ORAL | 0 refills | Status: DC | PRN

## 2022-01-21 NOTE — Telephone Encounter (Signed)
Next Visit: 06/22/2022  Last Visit: 01/12/2022  Last Fill: 10/28/2021  TZ:[id number] and osteoarthritis.    Current Dose per office note: Cosentyx 300 mg sq injections every 28 days.  Labs: CBC W/DIFF, CMP W/GFR, TB GOLD: Glucose is elevated-200.  Rest of CMP WNL. Plt count remains elevated but stable. Rest of CBC WNL.  TB Gold: 10/21/2021  -NEG  Okay to refill Cosentyx?

## 2022-01-21 NOTE — Progress Notes (Signed)
E visit for Flu like symptoms   We are sorry that you are not feeling well.  Here is how we plan to help! Based on what you have shared with me it looks like you may have flu-like symptoms that should be watched but do not seem to indicate anti-viral treatment.  Influenza or "the flu" is   an infection caused by a respiratory virus. The flu virus is highly contagious and persons who did not receive their yearly flu vaccination may "catch" the flu from close contact.  We have anti-viral medications to treat the viruses that cause this infection. They are not a "cure" and only shorten the course of the infection. These prescriptions are most effective when they are given within the first 2 days of "flu" symptoms. Antiviral medication are indicated if you have a high risk of complications from the flu. You should  also consider an antiviral medication if you are in close contact with someone who is at risk. These medications can help patients avoid complications from the flu  but have side effects that you should know. Possible side effects from Tamiflu or oseltamivir include nausea, vomiting, diarrhea, dizziness, headaches, eye redness, sleep problems or other respiratory symptoms. You should not take Tamiflu if you have an allergy to oseltamivir or any to the ingredients in Tamiflu.  Based upon your symptoms and potential risk factors I recommend that you follow the flu symptoms recommendation that I have listed below.  This is an infection that is most likely caused by a virus. There are no specific treatments other than to help you with the symptoms until the infection runs its course.  We are sorry you are not feeling well.  Here is how we plan to help!  For nasal congestion, you may use an oral decongestants such as Mucinex D or if you have glaucoma or high blood pressure use plain Mucinex.  Saline nasal spray or nasal drops can help and can safely be used as often as needed for congestion.  For  your congestion, I have prescribed Fluticasone nasal spray one spray in each nostril twice a day  If you do not have a history of heart disease, hypertension, diabetes or thyroid disease, prostate/bladder issues or glaucoma, you may also use Sudafed to treat nasal congestion.  It is highly recommended that you consult with a pharmacist or your primary care physician to ensure this medication is safe for you to take.     If you have a cough, you may use cough suppressants such as Delsym and Robitussin.  If you have glaucoma or high blood pressure, you can also use Coricidin HBP.   For cough I have prescribed for you A prescription cough medication called Tessalon Perles 100 mg. You may take 1-2 capsules every 8 hours as needed for cough and Promethazine DM Take 36mL every 6 hours as needed for cough  If you have a sore or scratchy throat, use a saltwater gargle-  to  teaspoon of salt dissolved in a 4-ounce to 8-ounce glass of warm water.  Gargle the solution for approximately 15-30 seconds and then spit.  It is important not to swallow the solution.  You can also use throat lozenges/cough drops and Chloraseptic spray to help with throat pain or discomfort.  Warm or cold liquids can also be helpful in relieving throat pain.  For headache, pain or general discomfort, you can use Ibuprofen or Tylenol as directed.   Some authorities believe that zinc sprays or  the use of Echinacea may shorten the course of your symptoms.   ANYONE WHO HAS FLU SYMPTOMS SHOULD: Stay home. The flu is highly contagious and going out or to work exposes others! Be sure to drink plenty of fluids. Water is fine as well as fruit juices, sodas and electrolyte beverages. You may want to stay away from caffeine or alcohol. If you are nauseated, try taking small sips of liquids. How do you know if you are getting enough fluid? Your urine should be a pale yellow or almost colorless. Get rest. Taking a steamy shower or using a  humidifier may help nasal congestion and ease sore throat pain. Using a saline nasal spray works much the same way. Cough drops, hard candies and sore throat lozenges may ease your cough. Line up a caregiver. Have someone check on you regularly.   GET HELP RIGHT AWAY IF: You cannot keep down liquids or your medications. You become short of breath Your fell like you are going to pass out or loose consciousness. Your symptoms persist after you have completed your treatment plan MAKE SURE YOU  Understand these instructions. Will watch your condition. Will get help right away if you are not doing well or get worse.  Your e-visit answers were reviewed by a board certified advanced clinical practitioner to complete your personal care plan.  Depending on the condition, your plan could have included both over the counter or prescription medications.  If there is a problem please reply  once you have received a response from your provider.  Your safety is important to [place].  If you have drug allergies check your prescription carefully.    You can use MyChart to ask questions about today's visit, request a non-urgent call back, or ask for a work or school excuse for 24 hours related to this e-Visit. If it has been greater than 24 hours you will need to follow up with your provider, or enter a new e-Visit to address those concerns.  You will get an e-mail in the next two days asking about your experience.  I hope that your e-visit has been valuable and will speed your recovery. Thank you for using e-visits.  I have spent 5 minutes in review of e-visit questionnaire, review and updating patient chart, medical decision making and response to patient.   Margaretann Loveless, PA-C

## 2022-01-25 ENCOUNTER — Encounter (HOSPITAL_COMMUNITY): Payer: Self-pay

## 2022-01-25 ENCOUNTER — Ambulatory Visit (HOSPITAL_COMMUNITY)
Admission: RE | Admit: 2022-01-25 | Discharge: 2022-01-25 | Disposition: A | Discharge status: Home / Self Care | Payer: PPO | Source: Ambulatory Visit | Attending: Internal Medicine | Admitting: Internal Medicine

## 2022-01-25 DIAGNOSIS — Z1239 Encounter for other screening for malignant neoplasm of breast: Secondary | ICD-10-CM | POA: Insufficient documentation

## 2022-01-25 DIAGNOSIS — N6081 Other benign mammary dysplasias of right breast: Secondary | ICD-10-CM | POA: Insufficient documentation

## 2022-01-25 DIAGNOSIS — N6310 Unspecified lump in the right breast, unspecified quadrant: Secondary | ICD-10-CM

## 2022-01-25 DIAGNOSIS — N6001 Solitary cyst of right breast: Secondary | ICD-10-CM | POA: Diagnosis not present

## 2022-01-25 DIAGNOSIS — R928 Other abnormal and inconclusive findings on diagnostic imaging of breast: Secondary | ICD-10-CM | POA: Diagnosis not present

## 2022-01-26 ENCOUNTER — Telehealth: Payer: PPO | Admitting: Physician Assistant

## 2022-01-26 DIAGNOSIS — B9689 Other specified bacterial agents as the cause of diseases classified elsewhere: Secondary | ICD-10-CM

## 2022-01-26 MED ORDER — AZITHROMYCIN 250 MG PO TABS: ORAL_TABLET | ORAL | 0 refills | Status: AC

## 2022-01-26 NOTE — Progress Notes (Signed)
We are sorry that you are not feeling well.  Here is how we plan to help!  Based on your presentation I believe you most likely have A cough due to bacteria.  When patients have a fever and a productive cough with a change in color or increased sputum production, we are concerned about bacterial bronchitis.  If left untreated it can progress to pneumonia.  If your symptoms do not improve with your treatment plan it is important that you contact your provider.   I have prescribed Azithromyin 250 mg: two tablets now and then one tablet daily for 4 additonal days    In addition you may use A non-prescription cough medication called Mucinex DM: take 2 tablets every 12 hours.  From your responses in the eVisit questionnaire you describe inflammation in the upper respiratory tract which is causing a significant cough.  This is commonly called Bronchitis and has four common causes:   Allergies Viral Infections Acid Reflux Bacterial Infection Allergies, viruses and acid reflux are treated by controlling symptoms or eliminating the cause. An example might be a cough caused by taking certain blood pressure medications. You stop the cough by changing the medication. Another example might be a cough caused by acid reflux. Controlling the reflux helps control the cough.  USE OF BRONCHODILATOR ("RESCUE") INHALERS: There is a risk from using your bronchodilator too frequently.  The risk is that over-reliance on a medication which only relaxes the muscles surrounding the breathing tubes can reduce the effectiveness of medications prescribed to reduce swelling and congestion of the tubes themselves.  Although you feel brief relief from the bronchodilator inhaler, your asthma may actually be worsening with the tubes becoming more swollen and filled with mucus.  This can delay other crucial treatments, such as oral steroid medications. If you need to use a bronchodilator inhaler daily, several times per day, you should  discuss this with your provider.  There are probably better treatments that could be used to keep your asthma under control.     HOME CARE Only take medications as instructed by your medical team. Complete the entire course of an antibiotic. Drink plenty of fluids and get plenty of rest. Avoid close contacts especially the very young and the elderly Cover your mouth if you cough or cough into your sleeve. Always remember to wash your hands A steam or ultrasonic humidifier can help congestion.   GET HELP RIGHT AWAY IF: You develop worsening fever. You become short of breath You cough up blood. Your symptoms persist after you have completed your treatment plan MAKE SURE YOU  Understand these instructions. Will watch your condition. Will get help right away if you are not doing well or get worse.    Thank you for choosing an e-visit.  Your e-visit answers were reviewed by a board certified advanced clinical practitioner to complete your personal care plan. Depending upon the condition, your plan could have included both over the counter or prescription medications.  Please review your pharmacy choice. Make sure the pharmacy is open so you can pick up prescription now. If there is a problem, you may contact your provider through MyChart messaging and have the prescription routed to another pharmacy.  Your safety is important to us. If you have drug allergies check your prescription carefully.   For the next 24 hours you can use MyChart to ask questions about today's visit, request a non-urgent call back, or ask for a work or school excuse. You will get   an email in the next two days asking about your experience. I hope that your e-visit has been valuable and will speed your recovery.  I have spent 5 minutes in review of e-visit questionnaire, review and updating patient chart, medical decision making and response to patient.   Sultan Pargas M Payeton Germani, PA-C  

## 2022-01-28 ENCOUNTER — Encounter (HOSPITAL_COMMUNITY): Payer: Self-pay | Admitting: *Deleted

## 2022-02-02 ENCOUNTER — Other Ambulatory Visit (HOSPITAL_COMMUNITY): Payer: Self-pay

## 2022-02-02 NOTE — Telephone Encounter (Signed)
Spoke with rep regarding PA for COSENTYX.  RX Advance [id number] The medication is approved until January 21, 2023. The representative will be faxing over the approval letter. Once approval letter is received, can fax PAP to [employer].

## 2022-02-02 NOTE — Telephone Encounter (Signed)
Submitted Patient Assistance Application to [employer] for [employer] along with provider portion, patient porton, med list, insurance card copy, PA and income documents. Will update patient when we receive a response.  Fax# [phone] Phone# [phone]  Knox Saliva, PharmD, MPH, BCPS, CPP Clinical Pharmacist (Rheumatology and Pulmonology)

## 2022-02-03 ENCOUNTER — Ambulatory Visit: Payer: PPO | Admitting: Podiatry

## 2022-02-03 NOTE — Telephone Encounter (Signed)
Received a fax from  [employer] regarding an approval for [place] patient assistance from 02/02/2022 to 01/24/2023. Approval letter sent to scan center.  Phone number: [phone]  Knox Saliva, PharmD, MPH, BCPS, CPP Clinical Pharmacist (Rheumatology and Pulmonology)

## 2022-02-17 DIAGNOSIS — L0390 Cellulitis, unspecified: Secondary | ICD-10-CM | POA: Diagnosis not present

## 2022-02-17 DIAGNOSIS — Z6841 Body Mass Index (BMI) 40.0 and over, adult: Secondary | ICD-10-CM | POA: Diagnosis not present

## 2022-02-22 DIAGNOSIS — I10 Essential (primary) hypertension: Secondary | ICD-10-CM | POA: Diagnosis not present

## 2022-02-22 DIAGNOSIS — E782 Mixed hyperlipidemia: Secondary | ICD-10-CM | POA: Diagnosis not present

## 2022-02-22 DIAGNOSIS — E1165 Type 2 diabetes mellitus with hyperglycemia: Secondary | ICD-10-CM | POA: Diagnosis not present

## 2022-03-18 ENCOUNTER — Telehealth: Payer: PPO | Admitting: Physician Assistant

## 2022-03-18 DIAGNOSIS — K120 Recurrent oral aphthae: Secondary | ICD-10-CM

## 2022-03-18 MED ORDER — CHLORHEXIDINE GLUCONATE 0.12 % MT SOLN: 15.0000 mL | Freq: Two times a day (BID) | OROMUCOSAL | 0 refills | Status: DC

## 2022-03-18 NOTE — Progress Notes (Signed)

## 2022-03-24 ENCOUNTER — Telehealth: Payer: PPO | Admitting: Nurse Practitioner

## 2022-03-24 DIAGNOSIS — R399 Unspecified symptoms and signs involving the genitourinary system: Secondary | ICD-10-CM

## 2022-03-24 MED ORDER — CEPHALEXIN 500 MG PO CAPS: 500.0000 mg | ORAL_CAPSULE | Freq: Two times a day (BID) | ORAL | 0 refills | Status: AC

## 2022-03-24 NOTE — Progress Notes (Signed)
E-Visit for Urinary Problems  We are sorry that you are not feeling well.  Here is how we plan to help!  Based on what you shared with me it looks like you most likely have a simple urinary tract infection.  A UTI (Urinary Tract Infection) is a bacterial infection of the bladder.  Most cases of urinary tract infections are simple to treat but a key part of your care is to encourage you to drink plenty of fluids and watch your symptoms carefully.  I have prescribed Keflex 500 mg twice a day for 7 days.  Your symptoms should gradually improve. Call us if the burning in your urine worsens, you develop worsening fever, back pain or pelvic pain or if your symptoms do not resolve after completing the antibiotic.  Urinary tract infections can be prevented by drinking plenty of water to keep your body hydrated.  Also be sure when you wipe, wipe from front to back and don't hold it in!  If possible, empty your bladder every 4 hours.  HOME CARE Drink plenty of fluids Compete the full course of the antibiotics even if the symptoms resolve Remember, when you need to go.go. Holding in your urine can increase the likelihood of getting a UTI! GET HELP RIGHT AWAY IF: You cannot urinate You get a high fever Worsening back pain occurs You see blood in your urine You feel sick to your stomach or throw up You feel like you are going to pass out  MAKE SURE YOU  Understand these instructions. Will watch your condition. Will get help right away if you are not doing well or get worse.   Thank you for choosing an e-visit.  Your e-visit answers were reviewed by a board certified advanced clinical practitioner to complete your personal care plan. Depending upon the condition, your plan could have included both over the counter or prescription medications.  Please review your pharmacy choice. Make sure the pharmacy is open so you can pick up prescription now. If there is a problem, you may contact your  provider through [employer] and have the prescription routed to another pharmacy.  Your safety is important to [place]. If you have drug allergies check your prescription carefully.   For the next 24 hours you can use MyChart to ask questions about today's visit, request a non-urgent call back, or ask for a work or school excuse. You will get an email in the next two days asking about your experience. I hope that your e-visit has been valuable and will speed your recovery.   I have spent at least 5 minutes reviewing and documenting in the patient's chart.

## 2022-04-06 DIAGNOSIS — M1990 Unspecified osteoarthritis, unspecified site: Secondary | ICD-10-CM | POA: Diagnosis not present

## 2022-04-06 DIAGNOSIS — K760 Fatty (change of) liver, not elsewhere classified: Secondary | ICD-10-CM | POA: Diagnosis not present

## 2022-04-06 DIAGNOSIS — F419 Anxiety disorder, unspecified: Secondary | ICD-10-CM | POA: Diagnosis not present

## 2022-04-06 DIAGNOSIS — E119 Type 2 diabetes mellitus without complications: Secondary | ICD-10-CM | POA: Diagnosis not present

## 2022-04-06 DIAGNOSIS — K219 Gastro-esophageal reflux disease without esophagitis: Secondary | ICD-10-CM | POA: Diagnosis not present

## 2022-04-06 DIAGNOSIS — I10 Essential (primary) hypertension: Secondary | ICD-10-CM | POA: Diagnosis not present

## 2022-04-06 DIAGNOSIS — Z79899 Other long term (current) drug therapy: Secondary | ICD-10-CM | POA: Diagnosis not present

## 2022-04-06 DIAGNOSIS — F329 Major depressive disorder, single episode, unspecified: Secondary | ICD-10-CM | POA: Diagnosis not present

## 2022-04-06 DIAGNOSIS — E039 Hypothyroidism, unspecified: Secondary | ICD-10-CM | POA: Diagnosis not present

## 2022-04-13 ENCOUNTER — Other Ambulatory Visit: Payer: Self-pay | Admitting: Rheumatology

## 2022-04-13 ENCOUNTER — Encounter: Payer: Self-pay | Admitting: *Deleted

## 2022-04-13 NOTE — Telephone Encounter (Signed)
Last Fill: 10/18/2021  Labs: 01/12/2022 Glucose is elevated-200.  Rest of CMP WNL. Plt count remains elevated but stable. Rest of CBC WNL.  Next Visit: 06/22/2022  Last Visit: 01/12/2022  DX: Spondyloarthropathy   Current Dose per office note 01/12/2022: methotrexate 8 tablets by mouth once weekly   Message sent to patient via my chart to advise patient she is due to update labs.   Okay to refill Methotrexate?

## 2022-04-19 ENCOUNTER — Other Ambulatory Visit: Payer: Self-pay | Admitting: *Deleted

## 2022-04-19 DIAGNOSIS — Z79899 Other long term (current) drug therapy: Secondary | ICD-10-CM

## 2022-04-20 LAB — CBC WITH DIFFERENTIAL/PLATELET
Absolute Monocytes: 713 cells/uL (ref 200–950)
Basophils Absolute: 79 cells/uL (ref 0–200)
Basophils Relative: 0.6 %
Eosinophils Absolute: 185 cells/uL (ref 15–500)
Eosinophils Relative: 1.4 %
HCT: 37.7 % (ref 35.0–45.0)
Hemoglobin: 12.4 g/dL (ref 11.7–15.5)
Lymphs Abs: 3696 cells/uL (ref 850–3900)
MCH: 28.8 pg (ref 27.0–33.0)
MCHC: 32.9 g/dL (ref 32.0–36.0)
MCV: 87.7 fL (ref 80.0–100.0)
MPV: 10.3 fL (ref 7.5–12.5)
Monocytes Relative: 5.4 %
Neutro Abs: 8527 cells/uL — ABNORMAL HIGH (ref 1500–7800)
Neutrophils Relative %: 64.6 %
Platelets: 430 10*3/uL — ABNORMAL HIGH (ref 140–400)
RBC: 4.3 10*6/uL (ref 3.80–5.10)
RDW: 14 % (ref 11.0–15.0)
Total Lymphocyte: 28 %
WBC: 13.2 10*3/uL — ABNORMAL HIGH (ref 3.8–10.8)

## 2022-04-20 LAB — COMPLETE METABOLIC PANEL WITH GFR
AG Ratio: 1.4 (calc) (ref 1.0–2.5)
ALT: 29 U/L (ref 6–29)
AST: 24 U/L (ref 10–35)
Albumin: 4 g/dL (ref 3.6–5.1)
Alkaline phosphatase (APISO): 97 U/L (ref 31–125)
BUN/Creatinine Ratio: 8 (calc) (ref 6–22)
BUN: 5 mg/dL — ABNORMAL LOW (ref 7–25)
CO2: 23 mmol/L (ref 20–32)
Calcium: 8.6 mg/dL (ref 8.6–10.2)
Chloride: 98 mmol/L (ref 98–110)
Creat: 0.66 mg/dL (ref 0.50–0.99)
Globulin: 2.9 g/dL (calc) (ref 1.9–3.7)
Glucose, Bld: 330 mg/dL — ABNORMAL HIGH (ref 65–99)
Potassium: 3.6 mmol/L (ref 3.5–5.3)
Sodium: 134 mmol/L — ABNORMAL LOW (ref 135–146)
Total Bilirubin: 0.3 mg/dL (ref 0.2–1.2)
Total Protein: 6.9 g/dL (ref 6.1–8.1)
eGFR: 110 mL/min/{1.73_m2} (ref >=60)

## 2022-04-22 DIAGNOSIS — E785 Hyperlipidemia, unspecified: Secondary | ICD-10-CM | POA: Diagnosis not present

## 2022-04-22 DIAGNOSIS — E039 Hypothyroidism, unspecified: Secondary | ICD-10-CM | POA: Diagnosis not present

## 2022-05-03 DIAGNOSIS — D2271 Melanocytic nevi of right lower limb, including hip: Secondary | ICD-10-CM | POA: Diagnosis not present

## 2022-05-03 DIAGNOSIS — L814 Other melanin hyperpigmentation: Secondary | ICD-10-CM | POA: Diagnosis not present

## 2022-05-03 DIAGNOSIS — D2272 Melanocytic nevi of left lower limb, including hip: Secondary | ICD-10-CM | POA: Diagnosis not present

## 2022-05-03 DIAGNOSIS — L570 Actinic keratosis: Secondary | ICD-10-CM | POA: Diagnosis not present

## 2022-05-03 DIAGNOSIS — D225 Melanocytic nevi of trunk: Secondary | ICD-10-CM | POA: Diagnosis not present

## 2022-05-03 DIAGNOSIS — L821 Other seborrheic keratosis: Secondary | ICD-10-CM | POA: Diagnosis not present

## 2022-05-04 ENCOUNTER — Telehealth: Payer: PPO | Admitting: Physician Assistant

## 2022-05-04 DIAGNOSIS — R3989 Other symptoms and signs involving the genitourinary system: Secondary | ICD-10-CM

## 2022-05-04 MED ORDER — CEPHALEXIN 500 MG PO CAPS: 500.0000 mg | ORAL_CAPSULE | Freq: Two times a day (BID) | ORAL | 0 refills | Status: DC

## 2022-05-04 NOTE — Progress Notes (Signed)

## 2022-06-01 ENCOUNTER — Other Ambulatory Visit: Payer: Self-pay | Admitting: Physician Assistant

## 2022-06-01 NOTE — Telephone Encounter (Signed)
Last Fill: 04/14/2022 (30 day supply)  Labs: 04/19/2022 Glucose is very elevated-330. Sodium is borderline low- WBC count is elevated-13.2. absolute neutrophils are elevated. Plt count remains elevated but is stable.   Next Visit: 06/22/2022  Last Visit: 01/12/2022  DX: Spondyloarthropathy   Current Dose per office note 01/12/2022: methotrexate 8 tablets by mouth once weekly   Okay to refill Methotrexate?

## 2022-06-08 NOTE — Progress Notes (Deleted)
Office Visit Note  Patient: Allison Atkinson             Date of Birth: 09/26/1975           MRN: [id number]             PCP: Carylon Perches, MD Referring: Carylon Perches, MD Visit Date: 06/22/2022 Occupation: @GUAROCC @  Subjective:  No chief complaint on file.   History of Present Illness: Allison Atkinson is a 47 y.o. female ***     Activities of Daily Living:  Patient reports morning stiffness for *** {minute/hour:19697}.   Patient {ACTIONS;DENIES/REPORTS:21021675::"Denies"} nocturnal pain.  Difficulty dressing/grooming: {ACTIONS;DENIES/REPORTS:21021675::"Denies"} Difficulty climbing stairs: {ACTIONS;DENIES/REPORTS:21021675::"Denies"} Difficulty getting out of chair: {ACTIONS;DENIES/REPORTS:21021675::"Denies"} Difficulty using hands for taps, buttons, cutlery, and/or writing: {ACTIONS;DENIES/REPORTS:21021675::"Denies"}  No Rheumatology ROS completed.   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   Cervical spondylosis without myelopathy 10/25/2017   Obesity 07/04/2017   Type 2 diabetes mellitus with hyperglycemia, without long-term current use of insulin (HCC) 03/30/2017   Mixed dyslipidemia 08/16/2016   Vitamin D insufficiency 08/16/2016   High risk medication use 03/30/2016   Acute midline low back pain without sciatica 03/30/2016   Primary osteoarthritis of both knees 01/12/2016   Spondylosis of lumbar region without myelopathy or radiculopathy 01/12/2016   Sacroiliitis, not elsewhere classified (HCC) 01/12/2016   Chronic diarrhea 01/12/2016   History of gastroesophageal reflux  (GERD) 01/12/2016   History of fatty infiltration of liver 01/12/2016   Hypertension 05/09/2013   Palpitations 04/11/2013   Spondyloarthropathy (HCC) 10/24/2009   PCOS (polycystic ovarian syndrome) 01/25/2003   Depression with anxiety 01/25/2003   GERD (gastroesophageal reflux disease) 01/25/2003   Hypothyroidism 01/24/2001    Past Medical History:  Diagnosis Date   Anxiety    Bipolar disorder (HCC)    Diabetes mellitus without complication (HCC) 05/2017   Fatty liver disease, nonalcoholic    History of cholecystectomy    Hypertension    Polycystic ovarian disease    RA (rheumatoid arthritis) (HCC)    Thyroid disease    hypothyroid    Family History  Problem Relation Age of Onset   Hypertension Mother    Alcohol abuse Father    Past Surgical History:  Procedure Laterality Date   CHOLECYSTECTOMY  1999   COLONOSCOPY W/ BIOPSIES  2010   ESOPHAGOGASTRODUODENOSCOPY  2010   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   Procedure: LAPAROSCOPIC ROUX-EN-Y GASTRIC BYPASS WITH UPPER ENDOSCOPY;  Surgeon: Sheliah Hatch, De Blanch, MD;  Location: [facility];  Service: General;  Laterality: N/A;   KNEE ARTHROPLASTY     KNEE ARTHROSCOPY Right    Social History   Social History Narrative   Not on file   Immunization History  Administered Date(s) Administered   Influenza,inj,Quad PF,6+ Mos 10/19/2017   Influenza-Unspecified 11/13/2012, 10/19/2017   Moderna Sars-Covid-2 Vaccination 04/25/2019, 05/23/2019, 12/12/2019, 05/23/2020   Pneumococcal Polysaccharide-23 10/24/2008   Tdap 06/28/2018     Objective: Vital Signs: There were no vitals taken for this visit.   Physical Exam   Musculoskeletal Exam: ***  CDAI Exam: CDAI Score: -- Patient Global: --; Provider  Global: -- Swollen: --; Tender: -- Joint Exam 06/22/2022   No joint exam has been documented for this visit   There is currently no information documented on the homunculus. Go to the Rheumatology activity and complete the homunculus  joint exam.  Investigation: No additional findings.  Imaging: No results found.  Recent Labs: Lab Results  Component Value Date   WBC 13.2 (H) 04/19/2022   HGB 12.4 04/19/2022   PLT 430 (H) 04/19/2022   NA 134 (L) 04/19/2022   K 3.6 04/19/2022   CL 98 04/19/2022   CO2 23 04/19/2022   GLUCOSE 330 (H) 04/19/2022   BUN 5 (L) 04/19/2022   CREATININE 0.66 04/19/2022   BILITOT 0.3 04/19/2022   ALKPHOS 60 07/05/2017   AST 24 04/19/2022   ALT 29 04/19/2022   PROT 6.9 04/19/2022   ALBUMIN 3.9 07/05/2017   CALCIUM 8.6 04/19/2022   GFRAA 129 03/05/2020   QFTBGOLDPLUS NEGATIVE 10/21/2021    Speciality Comments: Patient was on Humira in the past.  Patient had an adequate response to Humira and was switched to Cosentyx.  Procedures:  No procedures performed Allergies: Risperdal [risperidone] and Latex   Assessment / Plan:     Visit Diagnoses: No diagnosis found.  Orders: No orders of the defined types were placed in this encounter.  No orders of the defined types were placed in this encounter.   Face-to-face time spent with patient was *** minutes. Greater than 50% of time was spent in counseling and coordination of care.  Follow-Up Instructions: No follow-ups on file.   Ellen Henri, CMA  Note - This record has been created using Animal nutritionist.  Chart creation errors have been sought, but may not always  have been located. Such creation errors do not reflect on  the standard of medical care.

## 2022-06-13 ENCOUNTER — Other Ambulatory Visit: Payer: Self-pay | Admitting: *Deleted

## 2022-06-13 DIAGNOSIS — M47819 Spondylosis without myelopathy or radiculopathy, site unspecified: Secondary | ICD-10-CM

## 2022-06-13 DIAGNOSIS — M461 Sacroiliitis, not elsewhere classified: Secondary | ICD-10-CM

## 2022-06-13 MED ORDER — COSENTYX SENSOREADY (300 MG) 150 MG/ML ~~LOC~~ SOAJ: 300.0000 mg | SUBCUTANEOUS | 0 refills | Status: DC

## 2022-06-13 NOTE — Telephone Encounter (Signed)
Last Fill: 01/21/2022  Labs: 04/19/2022 Glucose is very elevated-330.  Please notify the patient. Sodium is borderline low- WBC count is elevated-13.2. absolute neutrophils are elevated=please clarify if she has had any recent infections? Plt count remains elevated but is  TB Gold: 10/22/2022   TB Gold is negative.   Next Visit: 06/22/2022  Last Visit: 01/12/2022   [id number]   Current Dose per office note 01/12/2022: Cosentyx 300 mg sq injections every 28 days,   Okay to refill Cosentyx?

## 2022-06-22 ENCOUNTER — Ambulatory Visit: Payer: PPO | Attending: Rheumatology | Admitting: Rheumatology

## 2022-06-22 ENCOUNTER — Encounter: Payer: Self-pay | Admitting: Rheumatology

## 2022-06-22 VITALS — Ht 63.0 in | Wt 265.0 lb

## 2022-06-22 DIAGNOSIS — N390 Urinary tract infection, site not specified: Secondary | ICD-10-CM

## 2022-06-22 DIAGNOSIS — Z8639 Personal history of other endocrine, nutritional and metabolic disease: Secondary | ICD-10-CM

## 2022-06-22 DIAGNOSIS — M170 Bilateral primary osteoarthritis of knee: Secondary | ICD-10-CM | POA: Diagnosis not present

## 2022-06-22 DIAGNOSIS — H15103 Unspecified episcleritis, bilateral: Secondary | ICD-10-CM

## 2022-06-22 DIAGNOSIS — Z79899 Other long term (current) drug therapy: Secondary | ICD-10-CM

## 2022-06-22 DIAGNOSIS — E282 Polycystic ovarian syndrome: Secondary | ICD-10-CM | POA: Diagnosis not present

## 2022-06-22 DIAGNOSIS — M19071 Primary osteoarthritis, right ankle and foot: Secondary | ICD-10-CM | POA: Diagnosis not present

## 2022-06-22 DIAGNOSIS — M19041 Primary osteoarthritis, right hand: Secondary | ICD-10-CM | POA: Diagnosis not present

## 2022-06-22 DIAGNOSIS — M722 Plantar fascial fibromatosis: Secondary | ICD-10-CM

## 2022-06-22 DIAGNOSIS — M461 Sacroiliitis, not elsewhere classified: Secondary | ICD-10-CM | POA: Diagnosis not present

## 2022-06-22 DIAGNOSIS — M5136 Other intervertebral disc degeneration, lumbar region: Secondary | ICD-10-CM | POA: Diagnosis not present

## 2022-06-22 DIAGNOSIS — M7062 Trochanteric bursitis, left hip: Secondary | ICD-10-CM

## 2022-06-22 DIAGNOSIS — M47819 Spondylosis without myelopathy or radiculopathy, site unspecified: Secondary | ICD-10-CM | POA: Diagnosis not present

## 2022-06-22 DIAGNOSIS — M7061 Trochanteric bursitis, right hip: Secondary | ICD-10-CM

## 2022-06-22 DIAGNOSIS — M19042 Primary osteoarthritis, left hand: Secondary | ICD-10-CM

## 2022-06-22 DIAGNOSIS — Z8719 Personal history of other diseases of the digestive system: Secondary | ICD-10-CM

## 2022-06-22 DIAGNOSIS — N6310 Unspecified lump in the right breast, unspecified quadrant: Secondary | ICD-10-CM

## 2022-06-22 DIAGNOSIS — F418 Other specified anxiety disorders: Secondary | ICD-10-CM

## 2022-06-22 DIAGNOSIS — I10 Essential (primary) hypertension: Secondary | ICD-10-CM

## 2022-06-22 DIAGNOSIS — M19072 Primary osteoarthritis, left ankle and foot: Secondary | ICD-10-CM

## 2022-06-22 NOTE — Progress Notes (Signed)
Virtual Visit via Video Note  I connected with Allison Atkinson on 06/22/22 at  3:20 PM EDT by a video enabled telemedicine application and verified that I am speaking with the correct person using two identifiers.  Location: Patient: At home Provider: In office   I discussed the limitations of evaluation and management by telemedicine and the availability of in person appointments. The patient expressed understanding and agreed to proceed. Chief complaint: Lower back pain  History of Present Illness:Allison Atkinson is a 47 y.o. female with history of spondyloarthropathy and osteoarthritis.  She states that 2 days ago she was moving some objects in her house and sprained her back.  She has been having a lot of discomfort and she cannot come in the office today.  She wanted to have a virtual visit.  She denies any radiculopathy.  She tried muscle relaxers without much relief.  She has been taking ibuprofen which has been helpful.  She denies having a flare of a spondyloarthropathy.  She continues to have some discomfort in the bilateral trochanteric bursa.  She denies any discomfort in her knee joints or her feet today.  She denies any flares of Planter fasciitis or Achilles tendinitis.  She states that her breast biopsy was benign.  Activities of Daily Living:  Patient reports morning stiffness for 2-3 hours.   Patient Reports nocturnal pain.  Difficulty dressing/grooming: Denies Difficulty climbing stairs: Reports Difficulty getting out of chair: Reports Difficulty using hands for taps, buttons, cutlery, and/or writing: Denies     Review of Systems  Constitutional:  Positive for malaise/fatigue.  HENT:  Negative for ear pain and tinnitus.   Eyes:  Negative for blurred vision, pain and redness.  Respiratory:  Negative for shortness of breath and wheezing.   Cardiovascular:  Negative for chest pain.  Gastrointestinal:  Positive for diarrhea. Negative for abdominal pain, constipation,  nausea and vomiting.  Genitourinary:  Negative for dysuria and frequency.  Musculoskeletal:  Positive for back pain, joint pain and myalgias.  Skin:  Negative for rash.  Neurological:  Positive for headaches. Negative for dizziness.  Endo/Heme/Allergies:  Bruises/bleeds easily.  Psychiatric/Behavioral:  Positive for depression. The patient is nervous/anxious. The patient does not have insomnia.     Observations/Objective: Physical Exam Constitutional:      Appearance: Normal appearance.  Neurological:     Mental Status: She is alert and oriented to person, place, and time.  Psychiatric:        Mood and Affect: Mood normal.        Behavior: Behavior normal.        Thought Content: Thought content normal.       Assessment and Plan: Visit Diagnoses: Spondyloarthropathy: Patient denies having a flare of a spondyloarthropathy.  She continues to be on Cosentyx 300 mg subcutaneous every 28 days and methotrexate 8 tablets p.o. weekly along with folic acid 2 mg daily.  She denies any interruption in the treatment.  She denies any flares of iritis, uveitis, Planter fasciitis or Achilles tendinitis.  She has intermittent discomfort in the trochanteric region.  She denies any discomfort in her knee joints or feet.    High risk medication use - Cosentyx 300 mg sq injections every 28 days, methotrexate 8 tablets by mouth once weekly, and folic acid 2 mg by mouth daily.  CBC and CMP on April 19, 2022 showed elevated white cell count.  Patient denies any infection at the time.  CMP was normal.  TB Gold was negative on  October 21, 2021.  She was advised to get labs every 3 months.  She will come for return labs in June.  She was advised to hold Cosyntex and methotrexate if she develops an infection and resume once the infection resolves.  Sacroiliitis [facility]): She denies SI joint discomfort.   Episcleritis of both eyes -she is followed by Dr. Fabian Sharp. Flare of episcleritis in the left eye starting in  January 2021- resolved in July 2021.  She has not had any signs or symptoms of a flare recently.    Primary osteoarthritis of both hands -she denies any discomfort in her hands today.  X-rays of both hands were obtained on 09/22/2020 which were consistent with osteoarthritis.    Trochanteric bursitis of both hips: She continues to have some discomfort over trochanteric region.  IT band stretches were discussed.    Primary osteoarthritis of both knees: She denies any joint discomfort.   Primary osteoarthritis of both feet: She is followed by Dr. Al Corpus.  She denies any recent discomfort of foot pain.  She denies plantar fasciitis or Achilles tendinitis.   Plantar fasciitis, bilateral: Resolved.  No recurrence.   DDD (degenerative disc disease), lumbar: She sprained her back 2 days ago after moving some objects in her home.  She has been having lower back pain.  She denies any radiculopathy.  She had some relief by taking ibuprofen.  Side effects of ibuprofen increased risk of GI bleed, hepatic and renal toxicity was discussed.  I advised her to use ibuprofen on a short term basis only as she is on methotrexate also.  Use of heating pad was advised.  Use of Salonpas was discussed.  I advised her to contact us if her symptoms get worse.    Other medical conditions are listed as follows:    Depression with anxiety   PCOS (polycystic ovarian syndrome)   History of fatty infiltration of liver   Essential hypertension   History of hypothyroidism   History of gastroesophageal reflux (GERD)   Recurrent UTI: She has not had any recent UTI.  Follow Up Instructions:    I discussed the assessment and treatment plan with the patient. The patient was provided an opportunity to ask questions and all were answered. The patient agreed with the plan and demonstrated an understanding of the instructions.   The patient was advised to call back or seek an in-person evaluation if the symptoms worsen or if  the condition fails to improve as anticipated.  I provided 20 minutes of non-face-to-face time during this encounter.   [name], MD

## 2022-07-18 ENCOUNTER — Other Ambulatory Visit: Payer: Self-pay | Admitting: *Deleted

## 2022-07-18 DIAGNOSIS — M461 Sacroiliitis, not elsewhere classified: Secondary | ICD-10-CM

## 2022-07-18 DIAGNOSIS — M47819 Spondylosis without myelopathy or radiculopathy, site unspecified: Secondary | ICD-10-CM

## 2022-07-18 NOTE — Telephone Encounter (Signed)
From: Mariel Aloe To: Office of Gearldine Bienenstock, [place] Sent: 07/18/2022 11:27 AM EDT Subject: Medication Renewal Request  Refills have been requested for the following medications:   Secukinumab, 300 MG Dose, (COSENTYX SENSOREADY, 300 MG,) 150 MG/ML SOAJ Allison Atkinson]  Preferred pharmacy: [facility] - [place], [place] - [address] Delivery method: Mail

## 2022-07-19 NOTE — Telephone Encounter (Signed)
Contacted [employer] to inquire if they have received prescription for patient's Cosentyx. Spoke with Jappara and after researching the patient's account and speaking with the pharmacy team she confirmed they have not received the prescription that was sent in on 06/13/2022. Verbal prescription provided to [facility], pharmacist. She also has expedited the patient's prescription for her.

## 2022-08-17 ENCOUNTER — Telehealth: Payer: PPO | Admitting: Family Medicine

## 2022-08-17 DIAGNOSIS — R3989 Other symptoms and signs involving the genitourinary system: Secondary | ICD-10-CM | POA: Diagnosis not present

## 2022-08-17 MED ORDER — NITROFURANTOIN MONOHYD MACRO 100 MG PO CAPS
100.0000 mg | ORAL_CAPSULE | Freq: Two times a day (BID) | ORAL | 0 refills | Status: AC
Start: 2022-08-17 — End: 2022-08-22

## 2022-08-17 NOTE — Progress Notes (Signed)
E-Visit for Urinary Problems  We are sorry that you are not feeling well.  Here is how we plan to help!  Based on what you shared with me it looks like you most likely have a simple urinary tract infection.  A UTI (Urinary Tract Infection) is a bacterial infection of the bladder.  Most cases of urinary tract infections are simple to treat but a key part of your care is to encourage you to drink plenty of fluids and watch your symptoms carefully.  I have prescribed MacroBid 100 mg twice a day for 5 days.  Your symptoms should gradually improve. Call us if the burning in your urine worsens, you develop worsening fever, back pain or pelvic pain or if your symptoms do not resolve after completing the antibiotic.  Urinary tract infections can be prevented by drinking plenty of water to keep your body hydrated.  Also be sure when you wipe, wipe from front to back and don't hold it in!  If possible, empty your bladder every 4 hours.  HOME CARE Drink plenty of fluids Compete the full course of the antibiotics even if the symptoms resolve Remember, when you need to go.go. Holding in your urine can increase the likelihood of getting a UTI! GET HELP RIGHT AWAY IF: You cannot urinate You get a high fever Worsening back pain occurs You see blood in your urine You feel sick to your stomach or throw up You feel like you are going to pass out  MAKE SURE YOU  Understand these instructions. Will watch your condition. Will get help right away if you are not doing well or get worse.   Thank you for choosing an e-visit.  Your e-visit answers were reviewed by a board certified advanced clinical practitioner to complete your personal care plan. Depending upon the condition, your plan could have included both over the counter or prescription medications.  Please review your pharmacy choice. Make sure the pharmacy is open so you can pick up prescription now. If there is a problem, you may contact your  provider through MyChart messaging and have the prescription routed to another pharmacy.  Your safety is important to us. If you have drug allergies check your prescription carefully.   For the next 24 hours you can use MyChart to ask questions about today's visit, request a non-urgent call back, or ask for a work or school excuse. You will get an email in the next two days asking about your experience. I hope that your e-visit has been valuable and will speed your recovery.  I provided 5 minutes of non face-to-face time during this encounter for chart review, medication and order placement, as well as and documentation.   

## 2022-08-18 ENCOUNTER — Other Ambulatory Visit: Payer: Self-pay | Admitting: *Deleted

## 2022-08-18 DIAGNOSIS — Z79899 Other long term (current) drug therapy: Secondary | ICD-10-CM | POA: Diagnosis not present

## 2022-08-18 DIAGNOSIS — Z5181 Encounter for therapeutic drug level monitoring: Secondary | ICD-10-CM | POA: Diagnosis not present

## 2022-08-18 DIAGNOSIS — F3175 Bipolar disorder, in partial remission, most recent episode depressed: Secondary | ICD-10-CM | POA: Diagnosis not present

## 2022-08-18 DIAGNOSIS — G4730 Sleep apnea, unspecified: Secondary | ICD-10-CM | POA: Diagnosis not present

## 2022-08-18 DIAGNOSIS — F3161 Bipolar disorder, current episode mixed, mild: Secondary | ICD-10-CM | POA: Diagnosis not present

## 2022-08-18 LAB — CBC WITH DIFFERENTIAL/PLATELET
Absolute Monocytes: 955 cells/uL — ABNORMAL HIGH (ref 200–950)
Basophils Absolute: 87 cells/uL (ref 0–200)
Basophils Relative: 0.7 %
Eosinophils Absolute: 174 cells/uL (ref 15–500)
Eosinophils Relative: 1.4 %
HCT: 40.6 % (ref 35.0–45.0)
Hemoglobin: 13.3 g/dL (ref 11.7–15.5)
Lymphs Abs: 3720 cells/uL (ref 850–3900)
MCH: 28 pg (ref 27.0–33.0)
MCHC: 32.8 g/dL (ref 32.0–36.0)
MCV: 85.5 fL (ref 80.0–100.0)
MPV: 9.9 fL (ref 7.5–12.5)
Monocytes Relative: 7.7 %
Neutro Abs: 7465 cells/uL (ref 1500–7800)
Neutrophils Relative %: 60.2 %
Platelets: 551 10*3/uL — ABNORMAL HIGH (ref 140–400)
RBC: 4.75 10*6/uL (ref 3.80–5.10)
RDW: 15.7 % — ABNORMAL HIGH (ref 11.0–15.0)
Total Lymphocyte: 30 %
WBC: 12.4 10*3/uL — ABNORMAL HIGH (ref 3.8–10.8)

## 2022-08-19 NOTE — Progress Notes (Signed)
White cell count remains mildly elevated.  Platelets remain elevated.  CMP is normal.  Recheck labs in 3 months.

## 2022-08-24 ENCOUNTER — Telehealth: Payer: PPO | Admitting: Nurse Practitioner

## 2022-08-24 DIAGNOSIS — R300 Dysuria: Secondary | ICD-10-CM

## 2022-08-24 NOTE — Progress Notes (Signed)
Allison Atkinson,  Thank you for submitting an e-visit. I am sorry that your symptoms returned. Our protocol is to refer you in person for urine testing and culture for recurrent symptoms. This may be due to antibiotic resistance and urine cultures are important at this point to assure you are treated with the best antibiotic for you moving forward.    I feel your condition warrants further evaluation and I recommend that you be seen for a face to face visit.  Please contact your primary care physician practice to be seen. Many offices offer virtual options to be seen via video if you are not comfortable going in person to a medical facility at this time.  NOTE: You will NOT be charged for this eVisit.  If you do not have a PCP, [facility] offers a free physician referral service available at [phone]. Our trained staff has the experience, knowledge and resources to put you in touch with a physician who is right for you.    If you are having a true medical emergency please call 911.   Your e-visit answers were reviewed by a board certified advanced clinical practitioner to complete your personal care plan.  Thank you for using e-Visits.

## 2022-08-26 ENCOUNTER — Other Ambulatory Visit: Payer: Self-pay | Admitting: Rheumatology

## 2022-08-26 NOTE — Telephone Encounter (Signed)
Last Fill: 12/14/2021  Next Visit: 09/22/2022  Last Visit: 06/22/2022 Video Visit  Dx: not mentioned   Current Dose per office note on 06/22/2022: not mentioned  Okay to refill Methocarbamol?

## 2022-09-05 ENCOUNTER — Encounter: Payer: Self-pay | Admitting: Rheumatology

## 2022-09-05 NOTE — Telephone Encounter (Signed)
Ok to schedule visit while Dr. Corliss Skains is in the office-she may require an ultrasound guided cortisone injection.

## 2022-09-07 NOTE — Progress Notes (Unsigned)
Office Visit Note  Patient: Allison Atkinson             Date of Birth: [birth year]           MRN: [id number]             PCP: Carylon Perches, MD Referring: Carylon Perches, MD Visit Date: 09/08/2022 Occupation: @GUAROCC @  Subjective:  No chief complaint on file.   History of Present Illness: Allison Atkinson is a 47 y.o. female ***     Activities of Daily Living:  Patient reports morning stiffness for *** {minute/hour:19697}.   Patient {ACTIONS;DENIES/REPORTS:21021675::"Denies"} nocturnal pain.  Difficulty dressing/grooming: {ACTIONS;DENIES/REPORTS:21021675::"Denies"} Difficulty climbing stairs: {ACTIONS;DENIES/REPORTS:21021675::"Denies"} Difficulty getting out of chair: {ACTIONS;DENIES/REPORTS:21021675::"Denies"} Difficulty using hands for taps, buttons, cutlery, and/or writing: {ACTIONS;DENIES/REPORTS:21021675::"Denies"}  No Rheumatology ROS completed.   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   Cervical spondylosis without myelopathy 10/25/2017   Obesity 07/04/2017   Type 2 diabetes mellitus with hyperglycemia, without long-term current use of insulin (HCC) 03/30/2017   Mixed dyslipidemia 08/16/2016   Vitamin D insufficiency 08/16/2016   High risk medication use 03/30/2016   Acute midline low back pain without sciatica 03/30/2016   Primary osteoarthritis of both knees 01/12/2016   Spondylosis of lumbar region without myelopathy or radiculopathy 01/12/2016   Sacroiliitis, not elsewhere classified (HCC) 01/12/2016   Chronic diarrhea 01/12/2016   History of gastroesophageal reflux  (GERD) 01/12/2016   History of fatty infiltration of liver 01/12/2016   Hypertension 05/09/2013   Palpitations 04/11/2013   Spondyloarthropathy (HCC) 10/24/2009   PCOS (polycystic ovarian syndrome) 01/25/2003   Depression with anxiety 01/25/2003   GERD (gastroesophageal reflux disease) 01/25/2003   Hypothyroidism 01/24/2001    Past Medical History:  Diagnosis Date   Anxiety    Bipolar disorder (HCC)    Diabetes mellitus without complication (HCC) 05/2017   Fatty liver disease, nonalcoholic    History of cholecystectomy    Hypertension    Polycystic ovarian disease    RA (rheumatoid arthritis) (HCC)    Thyroid disease    hypothyroid    Family History  Problem Relation Age of Onset   Hypertension Mother    Osteoporosis Mother        on prolia   Alcohol abuse Father    Past Surgical History:  Procedure Laterality Date   CHOLECYSTECTOMY  1999   COLONOSCOPY W/ BIOPSIES  2010   ESOPHAGOGASTRODUODENOSCOPY  2010   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   Procedure: LAPAROSCOPIC ROUX-EN-Y GASTRIC BYPASS WITH UPPER ENDOSCOPY;  Surgeon: Sheliah Hatch, De Blanch, MD;  Location: [facility];  Service: General;  Laterality: N/A;   KNEE ARTHROPLASTY     KNEE ARTHROSCOPY Right    Social History   Social History Narrative   Not on file   Immunization History  Administered Date(s) Administered   Influenza,inj,Quad PF,6+ Mos 10/19/2017   Influenza-Unspecified 11/13/2012, 10/19/2017   Moderna Sars-Covid-2 Vaccination 04/25/2019, 05/23/2019, 12/12/2019, 05/23/2020   Pneumococcal Polysaccharide-23 10/24/2008   Tdap 06/28/2018     Objective: Vital Signs: There were no vitals taken for this visit.   Physical Exam  Musculoskeletal Exam: ***  CDAI Exam: CDAI Score: -- Patient Global: --; Provider Global: -- Swollen: --; Tender: -- Joint Exam 09/08/2022   No joint exam has been documented for this visit   There is currently no information documented on the homunculus. Go to the  Rheumatology activity and complete the homunculus joint exam.  Investigation: No additional findings.  Imaging: No results found.  Recent Labs: Lab Results  Component Value Date   WBC 12.4 (H) 08/18/2022   HGB 13.3 08/18/2022   PLT 551 (H) 08/18/2022   NA 136 08/18/2022   K 3.9 08/18/2022   CL 101 08/18/2022   CO2 26 08/18/2022   GLUCOSE 87 08/18/2022   BUN 7 08/18/2022   CREATININE 0.57 08/18/2022   BILITOT 0.3 08/18/2022   ALKPHOS 60 07/05/2017   AST 20 08/18/2022   ALT 28 08/18/2022   PROT 7.2 08/18/2022   ALBUMIN 3.9 07/05/2017   CALCIUM 9.6 08/18/2022   GFRAA 129 03/05/2020   QFTBGOLDPLUS NEGATIVE 10/21/2021    Speciality Comments: Patient was on Humira in the past.  Patient had an adequate response to Humira and was switched to Cosentyx.  Procedures:  No procedures performed Allergies: Risperdal [risperidone] and Latex   Assessment / Plan:     Visit Diagnoses: No diagnosis found.  Orders: No orders of the defined types were placed in this encounter.  No orders of the defined types were placed in this encounter.   Face-to-face time spent with patient was *** minutes. Greater than 50% of time was spent in counseling and coordination of care.  Follow-Up Instructions: No follow-ups on file.   Ellen Henri, CMA  Note - This record has been created using Animal nutritionist.  Chart creation errors have been sought, but may not always  have been located. Such creation errors do not reflect on  the standard of medical care.

## 2022-09-08 ENCOUNTER — Encounter: Payer: Self-pay | Admitting: Rheumatology

## 2022-09-08 ENCOUNTER — Ambulatory Visit: Payer: PPO | Attending: Physician Assistant | Admitting: Rheumatology

## 2022-09-08 VITALS — BP 119/81 | HR 94 | Resp 17 | Ht 63.5 in | Wt 268.2 lb

## 2022-09-08 DIAGNOSIS — M7062 Trochanteric bursitis, left hip: Secondary | ICD-10-CM

## 2022-09-08 DIAGNOSIS — M170 Bilateral primary osteoarthritis of knee: Secondary | ICD-10-CM | POA: Diagnosis not present

## 2022-09-08 DIAGNOSIS — I10 Essential (primary) hypertension: Secondary | ICD-10-CM

## 2022-09-08 DIAGNOSIS — M461 Sacroiliitis, not elsewhere classified: Secondary | ICD-10-CM | POA: Diagnosis not present

## 2022-09-08 DIAGNOSIS — Z8719 Personal history of other diseases of the digestive system: Secondary | ICD-10-CM

## 2022-09-08 DIAGNOSIS — M19042 Primary osteoarthritis, left hand: Secondary | ICD-10-CM

## 2022-09-08 DIAGNOSIS — E282 Polycystic ovarian syndrome: Secondary | ICD-10-CM | POA: Diagnosis not present

## 2022-09-08 DIAGNOSIS — M722 Plantar fascial fibromatosis: Secondary | ICD-10-CM | POA: Diagnosis not present

## 2022-09-08 DIAGNOSIS — M7061 Trochanteric bursitis, right hip: Secondary | ICD-10-CM

## 2022-09-08 DIAGNOSIS — M19071 Primary osteoarthritis, right ankle and foot: Secondary | ICD-10-CM | POA: Diagnosis not present

## 2022-09-08 DIAGNOSIS — M47819 Spondylosis without myelopathy or radiculopathy, site unspecified: Secondary | ICD-10-CM

## 2022-09-08 DIAGNOSIS — M19072 Primary osteoarthritis, left ankle and foot: Secondary | ICD-10-CM

## 2022-09-08 DIAGNOSIS — F418 Other specified anxiety disorders: Secondary | ICD-10-CM | POA: Diagnosis not present

## 2022-09-08 DIAGNOSIS — N390 Urinary tract infection, site not specified: Secondary | ICD-10-CM

## 2022-09-08 DIAGNOSIS — M5136 Other intervertebral disc degeneration, lumbar region: Secondary | ICD-10-CM | POA: Diagnosis not present

## 2022-09-08 DIAGNOSIS — Z79899 Other long term (current) drug therapy: Secondary | ICD-10-CM

## 2022-09-08 DIAGNOSIS — M19041 Primary osteoarthritis, right hand: Secondary | ICD-10-CM | POA: Diagnosis not present

## 2022-09-08 DIAGNOSIS — H15103 Unspecified episcleritis, bilateral: Secondary | ICD-10-CM | POA: Diagnosis not present

## 2022-09-08 DIAGNOSIS — Z8639 Personal history of other endocrine, nutritional and metabolic disease: Secondary | ICD-10-CM

## 2022-09-08 MED ORDER — PREDNISONE 5 MG PO TABS: ORAL_TABLET | ORAL | 0 refills | Status: DC

## 2022-09-08 NOTE — Patient Instructions (Signed)
 Standing Labs We placed an order today for your standing lab work.   Please have your standing labs drawn in October and every 3 months  TB Gold with next labs  Please have your labs drawn 2 weeks prior to your appointment so that the provider can discuss your lab results at your appointment, if possible.  Please note that you may see your imaging and lab results in MyChart before we have reviewed them. We will contact you once all results are reviewed. Please allow our office up to 72 hours to thoroughly review all of the results before contacting the office for clarification of your results.  WALK-IN LAB HOURS  Monday through Thursday from 8:00 am -12:30 pm and 1:00 pm-5:00 pm and Friday from 8:00 am-12:00 pm.  Patients with office visits requiring labs will be seen before walk-in labs.  You may encounter longer than normal wait times. Please allow additional time. Wait times may be shorter on  Monday and Thursday afternoons.  We do not book appointments for walk-in labs. We appreciate your patience and understanding with our staff.   Labs are drawn by [facility]. Please bring your co-pay at the time of your lab draw.  You may receive a bill from [facility] for your lab work.  Please note if you are on Hydroxychloroquine and and an order has been placed for a Hydroxychloroquine level,  you will need to have it drawn 4 hours or more after your last dose.  If you wish to have your labs drawn at another location, please call the office 24 hours in advance so we can fax the orders.  The office is located at [address], [place], [place] [address]   If you have any questions regarding directions or hours of operation,  please call [phone].   As a reminder, please drink plenty of water prior to coming for your lab work. Thanks!   Vaccines You are taking a medication(s) that can suppress your immune system.  The following immunizations are recommended: Flu annually Covid-19   RSV Td/Tdap (tetanus, diphtheria, pertussis) every 10 years Pneumonia (Prevnar 15 then Pneumovax 23 at least 1 year apart.  Alternatively, can take Prevnar 20 without needing additional dose) Shingrix: 2 doses from 4 weeks to 6 months apart  Please check with your PCP to make sure you are up to date.   If you have signs or symptoms of an infection or start antibiotics: First, call your PCP for workup of your infection. Hold your medication through the infection, until you complete your antibiotics, and until symptoms resolve if you take the following: Injectable medication (Actemra, Benlysta, Cimzia, Cosentyx, Enbrel, Humira, Kevzara, Orencia, Remicade, Simponi, Stelara, Taltz, Tremfya) Methotrexate Leflunomide (Arava) Mycophenolate (Cellcept) Harriette Ohara, Olumiant, or Rinvoq

## 2022-09-14 ENCOUNTER — Ambulatory Visit: Payer: PPO | Admitting: Physician Assistant

## 2022-09-19 ENCOUNTER — Encounter: Payer: Self-pay | Admitting: Rheumatology

## 2022-09-20 NOTE — Telephone Encounter (Signed)
If patient would like to come in for a cortisone injection we can do an ultrasound-guided cortisone injection.

## 2022-09-20 NOTE — Telephone Encounter (Signed)
Patient scheduled 09/21/2022

## 2022-09-20 NOTE — Telephone Encounter (Signed)
I believe this message was incorrectly directed to me. You last saw the patient 8/15. Thanks.

## 2022-09-21 ENCOUNTER — Ambulatory Visit: Payer: PPO | Attending: Rheumatology | Admitting: Rheumatology

## 2022-09-21 ENCOUNTER — Ambulatory Visit (INDEPENDENT_AMBULATORY_CARE_PROVIDER_SITE_OTHER): Payer: PPO

## 2022-09-21 DIAGNOSIS — M19042 Primary osteoarthritis, left hand: Secondary | ICD-10-CM

## 2022-09-21 DIAGNOSIS — M79645 Pain in left finger(s): Secondary | ICD-10-CM

## 2022-09-21 DIAGNOSIS — M19041 Primary osteoarthritis, right hand: Secondary | ICD-10-CM

## 2022-09-21 DIAGNOSIS — M79642 Pain in left hand: Secondary | ICD-10-CM

## 2022-09-21 MED ORDER — LIDOCAINE HCL 1 % IJ SOLN
0.5000 mL | INTRAMUSCULAR | Status: AC | PRN
Start: 2022-09-21 — End: 2022-09-21
  Administered 2022-09-21: .5 mL

## 2022-09-21 MED ORDER — TRIAMCINOLONE ACETONIDE 40 MG/ML IJ SUSP
20.0000 mg | INTRAMUSCULAR | Status: AC | PRN
Start: 2022-09-21 — End: 2022-09-21
  Administered 2022-09-21: 20 mg via INTRA_ARTICULAR

## 2022-09-21 NOTE — Progress Notes (Signed)
CMC joint.   Procedure Note  Patient: Allison Atkinson             Date of Birth: Aug 03, 1975           MRN: [id number]             Visit Date: 09/21/2022  Procedures: Visit Diagnoses:  1. Thumb pain, left   2. Primary osteoarthritis of both hands   Ultrasound guided injection is preferred based studies that show increased duration, increased effect, greater accuracy, decreased procedural pain, increased response rate, and decreased cost with ultrasound guided versus blind injection.   Verbal informed consent obtained.  Time-out conducted.  Noted no overlying erythema, induration, or other signs of local infection. Ultrasound-guided left CMC injection after sterile prep with Betadine, injected 0.5 mL of 1% lidocaine and 20 mg Kenalog using a 27-gauge needle, in the left CMC joint.    Small Joint Inj: L thumb CMC on 09/21/2022 1:59 PM Indications: pain Details: 27 G needle, ultrasound-guided radial approach  Spinal Needle: No  Medications: 20 mg triamcinolone acetonide 40 MG/ML; 0.5 mL lidocaine 1 % Aspirate: 0 mL Outcome: tolerated well, no immediate complications Procedure, treatment alternatives, risks and benefits explained, specific risks discussed. Consent was given by the patient. Immediately prior to procedure a time out was called to verify the correct patient, procedure, equipment, support staff and site/side marked as required. Patient was prepped and draped in the usual sterile fashion.     Postprocedure instructions were given.  A prescription for left CMC brace was given.  Patient was advised to contact us if she develops any new symptoms or does not get adequate relief. [name], MD

## 2022-09-22 ENCOUNTER — Ambulatory Visit: Payer: PPO | Admitting: Physician Assistant

## 2022-09-27 ENCOUNTER — Other Ambulatory Visit: Payer: Self-pay | Admitting: Physician Assistant

## 2022-09-27 NOTE — Telephone Encounter (Signed)
Last Fill: 06/01/2022  Labs: 08/18/2022 White cell count remains mildly elevated.  Platelets remain elevated.  CMP is normal.   Next Visit: 12/13/2022  Last Visit: 09/08/2022  DX: Spondyloarthropathy   Current Dose per office note 09/08/2022: methotrexate 8 tablets by mouth once weekly   Okay to refill Methotrexate?

## 2022-11-03 DIAGNOSIS — Z79899 Other long term (current) drug therapy: Secondary | ICD-10-CM | POA: Diagnosis not present

## 2022-11-03 DIAGNOSIS — E039 Hypothyroidism, unspecified: Secondary | ICD-10-CM | POA: Diagnosis not present

## 2022-11-03 DIAGNOSIS — E7800 Pure hypercholesterolemia, unspecified: Secondary | ICD-10-CM | POA: Diagnosis not present

## 2022-11-03 DIAGNOSIS — E785 Hyperlipidemia, unspecified: Secondary | ICD-10-CM | POA: Diagnosis not present

## 2022-11-07 ENCOUNTER — Telehealth: Payer: Self-pay | Admitting: Pharmacist

## 2022-11-07 NOTE — Telephone Encounter (Signed)
Received PAP renewal application from [employer] for patient's Cosentyx SQ. Mailed to pt's home today.   Provider portion and PA renewal will be completed after patient portion is returned   Chesley Mires, PharmD, MPH, BCPS, CPP Clinical Pharmacist (Rheumatology and Pulmonology)

## 2022-11-08 DIAGNOSIS — E1165 Type 2 diabetes mellitus with hyperglycemia: Secondary | ICD-10-CM | POA: Diagnosis not present

## 2022-11-08 DIAGNOSIS — Z6841 Body Mass Index (BMI) 40.0 and over, adult: Secondary | ICD-10-CM | POA: Diagnosis not present

## 2022-11-08 DIAGNOSIS — E039 Hypothyroidism, unspecified: Secondary | ICD-10-CM | POA: Diagnosis not present

## 2022-11-22 DIAGNOSIS — G4733 Obstructive sleep apnea (adult) (pediatric): Secondary | ICD-10-CM | POA: Diagnosis not present

## 2022-11-29 NOTE — Progress Notes (Deleted)
Office Visit Note  Patient: Allison Atkinson             Date of Birth: Jan 27, 1975           MRN: [id number]             PCP: Carylon Perches, MD Referring: Carylon Perches, MD Visit Date: 12/13/2022 Occupation: @GUAROCC @  Subjective:    History of Present Illness: Allison Atkinson is a 47 y.o. female with history of  spondyloarthropathy and osteoarthritis. Patient remains on  Cosentyx 300 mg sq injections every 28 days, methotrexate 8 tablets by mouth once weekly, and folic acid 2 mg by mouth daily.   CBC and CMP updated on 12/08/22. Her next lab work will be due in February and every 3 months.   TB gold negative on 12/08/22  Discussed the importance of holding cosentyx and methotrexate if she develops signs or symptoms of an infection and to resume once the infection has completely cleared.    Activities of Daily Living:  Patient reports morning stiffness for *** {minute/hour:19697}.   Patient {ACTIONS;DENIES/REPORTS:21021675::"Denies"} nocturnal pain.  Difficulty dressing/grooming: {ACTIONS;DENIES/REPORTS:21021675::"Denies"} Difficulty climbing stairs: {ACTIONS;DENIES/REPORTS:21021675::"Denies"} Difficulty getting out of chair: {ACTIONS;DENIES/REPORTS:21021675::"Denies"} Difficulty using hands for taps, buttons, cutlery, and/or writing: {ACTIONS;DENIES/REPORTS:21021675::"Denies"}  No Rheumatology ROS completed.   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   Cervical spondylosis without myelopathy 10/25/2017   Obesity 07/04/2017   Type 2 diabetes mellitus with hyperglycemia, without long-term  current use of insulin (HCC) 03/30/2017   Mixed dyslipidemia 08/16/2016   Vitamin D insufficiency 08/16/2016   High risk medication use 03/30/2016   Acute midline low back pain without sciatica 03/30/2016   Primary osteoarthritis of both knees 01/12/2016   Spondylosis of lumbar region without myelopathy or radiculopathy 01/12/2016   Sacroiliitis, not elsewhere classified (HCC) 01/12/2016   Chronic diarrhea 01/12/2016   History of gastroesophageal reflux (GERD) 01/12/2016   History of fatty infiltration of liver 01/12/2016   Hypertension 05/09/2013   Palpitations 04/11/2013   Spondyloarthropathy (HCC) 10/24/2009   PCOS (polycystic ovarian syndrome) 01/25/2003   Depression with anxiety 01/25/2003   GERD (gastroesophageal reflux disease) 01/25/2003   Hypothyroidism 01/24/2001    Past Medical History:  Diagnosis Date   Anxiety    Bipolar disorder (HCC)    Diabetes mellitus without complication (HCC) 05/2017   Fatty liver disease, nonalcoholic    History of cholecystectomy    Hypertension    Polycystic ovarian disease    RA (rheumatoid arthritis) (HCC)    Thyroid disease    hypothyroid    Family History  Problem Relation Age of Onset   Hypertension Mother    Osteoporosis Mother        on prolia   Alcohol abuse Father    Past Surgical History:  Procedure Laterality Date   CHOLECYSTECTOMY  1999   COLONOSCOPY W/ BIOPSIES  2010   ESOPHAGOGASTRODUODENOSCOPY  2010   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   Procedure: LAPAROSCOPIC ROUX-EN-Y GASTRIC BYPASS WITH UPPER ENDOSCOPY;  Surgeon: Sheliah Hatch De Blanch, MD;  Location: [facility];  Service: General;  Laterality: N/A;  KNEE ARTHROPLASTY     KNEE ARTHROSCOPY Right    Social History   Social History Narrative   Not on file   Immunization History  Administered Date(s) Administered   Influenza,inj,Quad PF,6+ Mos 10/19/2017   Influenza-Unspecified 11/13/2012, 10/19/2017   Moderna Sars-Covid-2 Vaccination 04/25/2019, 05/23/2019,  12/12/2019, 05/23/2020   Pneumococcal Polysaccharide-23 10/24/2008   Tdap 06/28/2018     Objective: Vital Signs: There were no vitals taken for this visit.   Physical Exam Vitals and nursing note reviewed.  Constitutional:      Appearance: She is well-developed.  HENT:     Head: Normocephalic and atraumatic.  Eyes:     Conjunctiva/sclera: Conjunctivae normal.  Cardiovascular:     Rate and Rhythm: Normal rate and regular rhythm.     Heart sounds: Normal heart sounds.  Pulmonary:     Effort: Pulmonary effort is normal.     Breath sounds: Normal breath sounds.  Abdominal:     General: Bowel sounds are normal.     Palpations: Abdomen is soft.  Musculoskeletal:     Cervical back: Normal range of motion.  Lymphadenopathy:     Cervical: No cervical adenopathy.  Skin:    General: Skin is warm and dry.     Capillary Refill: Capillary refill takes less than 2 seconds.  Neurological:     Mental Status: She is alert and oriented to person, place, and time.  Psychiatric:        Behavior: Behavior normal.      Musculoskeletal Exam: ***  CDAI Exam: CDAI Score: -- Patient Global: --; Provider Global: -- Swollen: --; Tender: -- Joint Exam 12/13/2022   No joint exam has been documented for this visit   There is currently no information documented on the homunculus. Go to the Rheumatology activity and complete the homunculus joint exam.  Investigation: No additional findings.  Imaging: No results found.  Recent Labs: Lab Results  Component Value Date   WBC 12.4 (H) 08/18/2022   HGB 13.3 08/18/2022   PLT 551 (H) 08/18/2022   NA 136 08/18/2022   K 3.9 08/18/2022   CL 101 08/18/2022   CO2 26 08/18/2022   GLUCOSE 87 08/18/2022   BUN 7 08/18/2022   CREATININE 0.57 08/18/2022   BILITOT 0.3 08/18/2022   ALKPHOS 60 07/05/2017   AST 20 08/18/2022   ALT 28 08/18/2022   PROT 7.2 08/18/2022   ALBUMIN 3.9 07/05/2017   CALCIUM 9.6 08/18/2022   GFRAA 129 03/05/2020    QFTBGOLDPLUS NEGATIVE 10/21/2021    Speciality Comments: Patient was on Humira in the past.  Patient had an adequate response to Humira and was switched to Cosentyx.  Procedures:  No procedures performed Allergies: Risperdal [risperidone] and Latex   Assessment / Plan:     Visit Diagnoses: Spondyloarthropathy  High risk medication use  Sacroiliitis (HCC)  Episcleritis of both eyes  Primary osteoarthritis of both hands  Thumb pain, left  Trochanteric bursitis of both hips  Primary osteoarthritis of both knees  Primary osteoarthritis of both feet  Plantar fasciitis, bilateral  Degeneration of intervertebral disc of lumbar region without discogenic back pain or lower extremity pain  Depression with anxiety  PCOS (polycystic ovarian syndrome)  History of hypothyroidism  History of fatty infiltration of liver  Essential hypertension  History of gastroesophageal reflux (GERD)  Recurrent UTI  Mass of right breast, unspecified quadrant  Orders: No orders of the defined types were placed in this encounter.  No orders of the defined types were  placed in this encounter.   Face-to-face time spent with patient was *** minutes. Greater than 50% of time was spent in counseling and coordination of care.  Follow-Up Instructions: No follow-ups on file.   Gearldine Bienenstock, PA-C  Note - This record has been created using Dragon software.  Chart creation errors have been sought, but may not always  have been located. Such creation errors do not reflect on  the standard of medical care.

## 2022-12-08 ENCOUNTER — Other Ambulatory Visit: Payer: Self-pay | Admitting: *Deleted

## 2022-12-08 DIAGNOSIS — Z79899 Other long term (current) drug therapy: Secondary | ICD-10-CM

## 2022-12-08 NOTE — Telephone Encounter (Signed)
Received signed patient forms with income documents from patient today. Scanned to [employer] with med list, PA approval letter, and insurance card copy  Provider portion placed in Sherron Ales, PA-C's, folder for signature  Chesley Mires, PharmD, MPH, BCPS, CPP Clinical Pharmacist (Rheumatology and Pulmonology)

## 2022-12-09 NOTE — Progress Notes (Signed)
CBC and CMP are stable.  White cell count remains elevated.  Platelet remains elevated and stable.  Glucose is elevated.  Please forward results to her PCP.

## 2022-12-10 LAB — COMPLETE METABOLIC PANEL WITH GFR
AG Ratio: 1.6 (calc) (ref 1.0–2.5)
ALT: 21 U/L (ref 6–29)
AST: 17 U/L (ref 10–35)
Albumin: 4.2 g/dL (ref 3.6–5.1)
Alkaline phosphatase (APISO): 102 U/L (ref 31–125)
BUN: 8 mg/dL (ref 7–25)
CO2: 27 mmol/L (ref 20–32)
Calcium: 9.7 mg/dL (ref 8.6–10.2)
Chloride: 98 mmol/L (ref 98–110)
Creat: 0.61 mg/dL (ref 0.50–0.99)
Globulin: 2.7 g/dL (ref 1.9–3.7)
Glucose, Bld: 183 mg/dL — ABNORMAL HIGH (ref 65–99)
Potassium: 3.6 mmol/L (ref 3.5–5.3)
Sodium: 134 mmol/L — ABNORMAL LOW (ref 135–146)
Total Bilirubin: 0.3 mg/dL (ref 0.2–1.2)
Total Protein: 6.9 g/dL (ref 6.1–8.1)
eGFR: 112 mL/min/{1.73_m2} (ref >=60)

## 2022-12-10 LAB — CBC WITH DIFFERENTIAL/PLATELET
Absolute Lymphocytes: 3232 {cells}/uL (ref 850–3900)
Absolute Monocytes: 610 {cells}/uL (ref 200–950)
Basophils Absolute: 46 {cells}/uL (ref 0–200)
Basophils Relative: 0.4 %
Eosinophils Absolute: 173 {cells}/uL (ref 15–500)
Eosinophils Relative: 1.5 %
HCT: 39.7 % (ref 35.0–45.0)
Hemoglobin: 12.5 g/dL (ref 11.7–15.5)
MCH: 27.7 pg (ref 27.0–33.0)
MCHC: 31.5 g/dL — ABNORMAL LOW (ref 32.0–36.0)
MCV: 87.8 fL (ref 80.0–100.0)
MPV: 10.2 fL (ref 7.5–12.5)
Monocytes Relative: 5.3 %
Neutro Abs: 7441 {cells}/uL (ref 1500–7800)
Neutrophils Relative %: 64.7 %
Platelets: 447 10*3/uL — ABNORMAL HIGH (ref 140–400)
RBC: 4.52 10*6/uL (ref 3.80–5.10)
RDW: 15.6 % — ABNORMAL HIGH (ref 11.0–15.0)
Total Lymphocyte: 28.1 %
WBC: 11.5 10*3/uL — ABNORMAL HIGH (ref 3.8–10.8)

## 2022-12-10 LAB — QUANTIFERON-TB GOLD PLUS
Mitogen-NIL: >10 [IU]/mL
NIL: 0.04 [IU]/mL
QuantiFERON-TB Gold Plus: NEGATIVE
TB1-NIL: 0.01 [IU]/mL
TB2-NIL: 0 [IU]/mL

## 2022-12-11 NOTE — Progress Notes (Signed)
TB Gold is negative.

## 2022-12-12 DIAGNOSIS — G4733 Obstructive sleep apnea (adult) (pediatric): Secondary | ICD-10-CM | POA: Diagnosis not present

## 2022-12-12 NOTE — Telephone Encounter (Signed)
Received signed provider form from Sherron Ales, PA-C. Submitted Patient Assistance RENEWAL Application to [employer] for COSENTYX SQ along with provider portion, patient portion, PA, medication list, insurance card copy and income documents. Will update patient when we receive a response.  Phone: [phone] Fax: [phone]  Chesley Mires, PharmD, MPH, BCPS, CPP Clinical Pharmacist (Rheumatology and Pulmonology)

## 2022-12-13 ENCOUNTER — Ambulatory Visit: Payer: PPO | Admitting: Physician Assistant

## 2022-12-13 DIAGNOSIS — N6310 Unspecified lump in the right breast, unspecified quadrant: Secondary | ICD-10-CM

## 2022-12-13 DIAGNOSIS — N390 Urinary tract infection, site not specified: Secondary | ICD-10-CM

## 2022-12-13 DIAGNOSIS — E282 Polycystic ovarian syndrome: Secondary | ICD-10-CM

## 2022-12-13 DIAGNOSIS — M79645 Pain in left finger(s): Secondary | ICD-10-CM

## 2022-12-13 DIAGNOSIS — M19041 Primary osteoarthritis, right hand: Secondary | ICD-10-CM

## 2022-12-13 DIAGNOSIS — F418 Other specified anxiety disorders: Secondary | ICD-10-CM

## 2022-12-13 DIAGNOSIS — Z8639 Personal history of other endocrine, nutritional and metabolic disease: Secondary | ICD-10-CM

## 2022-12-13 DIAGNOSIS — Z8719 Personal history of other diseases of the digestive system: Secondary | ICD-10-CM

## 2022-12-13 DIAGNOSIS — M47819 Spondylosis without myelopathy or radiculopathy, site unspecified: Secondary | ICD-10-CM

## 2022-12-13 DIAGNOSIS — M7061 Trochanteric bursitis, right hip: Secondary | ICD-10-CM

## 2022-12-13 DIAGNOSIS — M722 Plantar fascial fibromatosis: Secondary | ICD-10-CM

## 2022-12-13 DIAGNOSIS — H15103 Unspecified episcleritis, bilateral: Secondary | ICD-10-CM

## 2022-12-13 DIAGNOSIS — Z79899 Other long term (current) drug therapy: Secondary | ICD-10-CM

## 2022-12-13 DIAGNOSIS — M461 Sacroiliitis, not elsewhere classified: Secondary | ICD-10-CM

## 2022-12-13 DIAGNOSIS — I10 Essential (primary) hypertension: Secondary | ICD-10-CM

## 2022-12-13 DIAGNOSIS — M170 Bilateral primary osteoarthritis of knee: Secondary | ICD-10-CM

## 2022-12-13 DIAGNOSIS — M19071 Primary osteoarthritis, right ankle and foot: Secondary | ICD-10-CM

## 2022-12-13 DIAGNOSIS — M51369 Other intervertebral disc degeneration, lumbar region without mention of lumbar back pain or lower extremity pain: Secondary | ICD-10-CM

## 2022-12-14 DIAGNOSIS — M47819 Spondylosis without myelopathy or radiculopathy, site unspecified: Secondary | ICD-10-CM

## 2022-12-14 DIAGNOSIS — M461 Sacroiliitis, not elsewhere classified: Secondary | ICD-10-CM

## 2022-12-14 MED ORDER — COSENTYX SENSOREADY (300 MG) 150 MG/ML ~~LOC~~ SOAJ: 300.0000 mg | SUBCUTANEOUS | 0 refills | Status: DC

## 2022-12-14 NOTE — Telephone Encounter (Signed)
Last Fill: 06/13/2022  Labs: 12/08/2022 CBC and CMP are stable.  White cell count remains elevated.  Platelet remains elevated and stable.  Glucose is elevated.   TB Gold: 12/08/2022 Neg    Next Visit: 12/21/2022  Last Visit: 09/08/2022  NG:[id number]   Current Dose per office note 09/08/2022: Cosentyx 300 mg sq injections every 28 days   Okay to refill Cosentyx?

## 2022-12-19 NOTE — Progress Notes (Deleted)
Office Visit Note  Patient: Allison Atkinson             Date of Birth: 04-15-1975           MRN: [id number]             PCP: Carylon Perches, MD Referring: Carylon Perches, MD Visit Date: 12/20/2022 Occupation: @GUAROCC @  Subjective:    History of Present Illness: Allison Atkinson is a 47 y.o. female with history of  spondyloarthropathy and osteoarthritis. Patient remains on  Cosentyx 300 mg sq injections every 28 days, methotrexate 8 tablets by mouth once weekly, and folic acid 2 mg by mouth daily.   CBC and CMP updated on 12/08/22. Her next lab work will be due in February and every 3 months.   TB gold negative on 12/08/22  Discussed the importance of holding cosentyx and methotrexate if she develops signs or symptoms of an infection and to resume once the infection has completely cleared.    Activities of Daily Living:  Patient reports morning stiffness for *** {minute/hour:19697}.   Patient {ACTIONS;DENIES/REPORTS:21021675::"Denies"} nocturnal pain.  Difficulty dressing/grooming: {ACTIONS;DENIES/REPORTS:21021675::"Denies"} Difficulty climbing stairs: {ACTIONS;DENIES/REPORTS:21021675::"Denies"} Difficulty getting out of chair: {ACTIONS;DENIES/REPORTS:21021675::"Denies"} Difficulty using hands for taps, buttons, cutlery, and/or writing: {ACTIONS;DENIES/REPORTS:21021675::"Denies"}  No Rheumatology ROS completed.   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   Cervical spondylosis without myelopathy 10/25/2017   Obesity 07/04/2017   Type 2 diabetes mellitus with hyperglycemia, without long-term  current use of insulin (HCC) 03/30/2017   Mixed dyslipidemia 08/16/2016   Vitamin D insufficiency 08/16/2016   High risk medication use 03/30/2016   Acute midline low back pain without sciatica 03/30/2016   Primary osteoarthritis of both knees 01/12/2016   Spondylosis of lumbar region without myelopathy or radiculopathy 01/12/2016   Sacroiliitis, not elsewhere classified (HCC) 01/12/2016   Chronic diarrhea 01/12/2016   History of gastroesophageal reflux (GERD) 01/12/2016   History of fatty infiltration of liver 01/12/2016   Hypertension 05/09/2013   Palpitations 04/11/2013   Spondyloarthropathy (HCC) 10/24/2009   PCOS (polycystic ovarian syndrome) 01/25/2003   Depression with anxiety 01/25/2003   GERD (gastroesophageal reflux disease) 01/25/2003   Hypothyroidism 01/24/2001    Past Medical History:  Diagnosis Date   Anxiety    Bipolar disorder (HCC)    Diabetes mellitus without complication (HCC) 05/2017   Fatty liver disease, nonalcoholic    History of cholecystectomy    Hypertension    Polycystic ovarian disease    RA (rheumatoid arthritis) (HCC)    Thyroid disease    hypothyroid    Family History  Problem Relation Age of Onset   Hypertension Mother    Osteoporosis Mother        on prolia   Alcohol abuse Father    Past Surgical History:  Procedure Laterality Date   CHOLECYSTECTOMY  1999   COLONOSCOPY W/ BIOPSIES  2010   ESOPHAGOGASTRODUODENOSCOPY  2010   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   Procedure: LAPAROSCOPIC ROUX-EN-Y GASTRIC BYPASS WITH UPPER ENDOSCOPY;  Surgeon: Sheliah Hatch De Blanch, MD;  Location: [facility];  Service: General;  Laterality: N/A;  KNEE ARTHROPLASTY     KNEE ARTHROSCOPY Right    Social History   Social History Narrative   Not on file   Immunization History  Administered Date(s) Administered   Influenza,inj,Quad PF,6+ Mos 10/19/2017   Influenza-Unspecified 11/13/2012, 10/19/2017   Moderna Sars-Covid-2 Vaccination 04/25/2019, 05/23/2019,  12/12/2019, 05/23/2020   Pneumococcal Polysaccharide-23 10/24/2008   Tdap 06/28/2018     Objective: Vital Signs: There were no vitals taken for this visit.   Physical Exam Vitals and nursing note reviewed.  Constitutional:      Appearance: She is well-developed.  HENT:     Head: Normocephalic and atraumatic.  Eyes:     Conjunctiva/sclera: Conjunctivae normal.  Cardiovascular:     Rate and Rhythm: Normal rate and regular rhythm.     Heart sounds: Normal heart sounds.  Pulmonary:     Effort: Pulmonary effort is normal.     Breath sounds: Normal breath sounds.  Abdominal:     General: Bowel sounds are normal.     Palpations: Abdomen is soft.  Musculoskeletal:     Cervical back: Normal range of motion.  Lymphadenopathy:     Cervical: No cervical adenopathy.  Skin:    General: Skin is warm and dry.     Capillary Refill: Capillary refill takes less than 2 seconds.  Neurological:     Mental Status: She is alert and oriented to person, place, and time.  Psychiatric:        Behavior: Behavior normal.      Musculoskeletal Exam: ***  CDAI Exam: CDAI Score: -- Patient Global: --; Provider Global: -- Swollen: --; Tender: -- Joint Exam 12/20/2022   No joint exam has been documented for this visit   There is currently no information documented on the homunculus. Go to the Rheumatology activity and complete the homunculus joint exam.  Investigation: No additional findings.  Imaging: No results found.  Recent Labs: Lab Results  Component Value Date   WBC 11.5 (H) 12/08/2022   HGB 12.5 12/08/2022   PLT 447 (H) 12/08/2022   NA 134 (L) 12/08/2022   K 3.6 12/08/2022   CL 98 12/08/2022   CO2 27 12/08/2022   GLUCOSE 183 (H) 12/08/2022   BUN 8 12/08/2022   CREATININE 0.61 12/08/2022   BILITOT 0.3 12/08/2022   ALKPHOS 60 07/05/2017   AST 17 12/08/2022   ALT 21 12/08/2022   PROT 6.9 12/08/2022   ALBUMIN 3.9 07/05/2017   CALCIUM 9.7 12/08/2022   GFRAA 129 03/05/2020    QFTBGOLDPLUS NEGATIVE 12/08/2022    Speciality Comments: Patient was on Humira in the past.  Patient had an adequate response to Humira and was switched to Cosentyx.  Procedures:  No procedures performed Allergies: Risperdal [risperidone] and Latex   Assessment / Plan:     Visit Diagnoses: No diagnosis found.  Orders: No orders of the defined types were placed in this encounter.  No orders of the defined types were placed in this encounter.   Face-to-face time spent with patient was *** minutes. Greater than 50% of time was spent in counseling and coordination of care.  Follow-Up Instructions: No follow-ups on file.   Ellen Henri, CMA  Note - This record has been created using Animal nutritionist.  Chart creation errors have been sought, but may not always  have been located. Such creation errors do not reflect on  the standard of medical care.

## 2022-12-20 ENCOUNTER — Ambulatory Visit: Payer: PPO | Admitting: Physician Assistant

## 2022-12-20 ENCOUNTER — Encounter: Payer: Self-pay | Admitting: *Deleted

## 2022-12-20 DIAGNOSIS — M7061 Trochanteric bursitis, right hip: Secondary | ICD-10-CM

## 2022-12-20 DIAGNOSIS — N390 Urinary tract infection, site not specified: Secondary | ICD-10-CM

## 2022-12-20 DIAGNOSIS — H15103 Unspecified episcleritis, bilateral: Secondary | ICD-10-CM

## 2022-12-20 DIAGNOSIS — M461 Sacroiliitis, not elsewhere classified: Secondary | ICD-10-CM

## 2022-12-20 DIAGNOSIS — F418 Other specified anxiety disorders: Secondary | ICD-10-CM

## 2022-12-20 DIAGNOSIS — M47816 Spondylosis without myelopathy or radiculopathy, lumbar region: Secondary | ICD-10-CM

## 2022-12-20 DIAGNOSIS — M19042 Primary osteoarthritis, left hand: Secondary | ICD-10-CM

## 2022-12-20 DIAGNOSIS — E282 Polycystic ovarian syndrome: Secondary | ICD-10-CM

## 2022-12-20 DIAGNOSIS — Z8719 Personal history of other diseases of the digestive system: Secondary | ICD-10-CM

## 2022-12-20 DIAGNOSIS — M19071 Primary osteoarthritis, right ankle and foot: Secondary | ICD-10-CM

## 2022-12-20 DIAGNOSIS — I10 Essential (primary) hypertension: Secondary | ICD-10-CM

## 2022-12-20 DIAGNOSIS — Z79899 Other long term (current) drug therapy: Secondary | ICD-10-CM

## 2022-12-20 DIAGNOSIS — M47819 Spondylosis without myelopathy or radiculopathy, site unspecified: Secondary | ICD-10-CM

## 2022-12-20 DIAGNOSIS — M170 Bilateral primary osteoarthritis of knee: Secondary | ICD-10-CM

## 2022-12-20 DIAGNOSIS — Z8639 Personal history of other endocrine, nutritional and metabolic disease: Secondary | ICD-10-CM

## 2022-12-20 DIAGNOSIS — M722 Plantar fascial fibromatosis: Secondary | ICD-10-CM

## 2022-12-21 ENCOUNTER — Ambulatory Visit: Payer: PPO | Admitting: Physician Assistant

## 2022-12-26 NOTE — Telephone Encounter (Signed)
Patient has been dismissed from department due to cancelling appointment. Closing this encounter  Chesley Mires, PharmD, MPH, BCPS, CPP Clinical Pharmacist (Rheumatology and Pulmonology)

## 2022-12-28 DIAGNOSIS — E785 Hyperlipidemia, unspecified: Secondary | ICD-10-CM | POA: Diagnosis not present

## 2022-12-28 DIAGNOSIS — E1169 Type 2 diabetes mellitus with other specified complication: Secondary | ICD-10-CM | POA: Diagnosis not present

## 2022-12-29 ENCOUNTER — Other Ambulatory Visit: Payer: Self-pay | Admitting: Pharmacist

## 2022-12-29 DIAGNOSIS — M461 Sacroiliitis, not elsewhere classified: Secondary | ICD-10-CM

## 2022-12-29 DIAGNOSIS — M47819 Spondylosis without myelopathy or radiculopathy, site unspecified: Secondary | ICD-10-CM

## 2022-12-29 MED ORDER — COSENTYX SENSOREADY (300 MG) 150 MG/ML ~~LOC~~ SOAJ: 300.0000 mg | SUBCUTANEOUS | 0 refills | Status: AC

## 2022-12-29 NOTE — Telephone Encounter (Signed)
Received a fax from  [employer] regarding an approval for COSENTYX SQ patient assistance from 12/29/2022 to 01/24/2024. Approval letter sent to scan center.  Phone: [phone] Fax: [phone]  Since patient has been discharged from our clinic, I've called [employer] to cancel prescriptions under our providers' names since enrollment form had 3 month supply on it  Chesley Mires, PharmD, MPH, BCPS, CPP Clinical Pharmacist (Rheumatology and Pulmonology)

## 2023-01-03 DIAGNOSIS — H1012 Acute atopic conjunctivitis, left eye: Secondary | ICD-10-CM | POA: Diagnosis not present

## 2023-01-03 DIAGNOSIS — H15002 Unspecified scleritis, left eye: Secondary | ICD-10-CM | POA: Diagnosis not present

## 2023-01-10 DIAGNOSIS — G4733 Obstructive sleep apnea (adult) (pediatric): Secondary | ICD-10-CM | POA: Diagnosis not present

## 2023-02-03 ENCOUNTER — Encounter (HOSPITAL_COMMUNITY): Payer: Self-pay | Admitting: *Deleted

## 2023-02-04 ENCOUNTER — Telehealth: Payer: PPO | Admitting: Family Medicine

## 2023-02-04 DIAGNOSIS — R059 Cough, unspecified: Secondary | ICD-10-CM | POA: Diagnosis not present

## 2023-02-04 MED ORDER — PREDNISONE 10 MG (21) PO TBPK: ORAL_TABLET | ORAL | 0 refills | Status: DC

## 2023-02-04 NOTE — Addendum Note (Signed)
 Addended byReed Pandy on: 02/04/2023 05:18 PM   Modules accepted: Orders

## 2023-02-04 NOTE — Progress Notes (Signed)
 E-Visit for Cough   We are sorry that you are not feeling well.  Here is how we plan to help!  Based on your presentation I believe you most likely have A cough due to a virus.  This is called viral bronchitis and is best treated by rest, plenty of fluids and control of the cough.  You may use Ibuprofen or Tylenol  as directed to help your symptoms.     In addition you may use A non-prescription cough medication called Mucinex DM: take 2 tablets every 12 hours.  I will also prescribe Prednisone  10 mg daily for 6 days (see instructions provided by the pharmacy)  From your responses in the eVisit questionnaire you describe inflammation in the upper respiratory tract which is causing a significant cough.  This is commonly called Bronchitis and has four common causes:   Allergies Viral Infections Acid Reflux Bacterial Infection Allergies, viruses and acid reflux are treated by controlling symptoms or eliminating the cause. An example might be a cough caused by taking certain blood pressure medications. You stop the cough by changing the medication. Another example might be a cough caused by acid reflux. Controlling the reflux helps control the cough.  USE OF BRONCHODILATOR (RESCUE) INHALERS: There is a risk from using your bronchodilator too frequently.  The risk is that over-reliance on a medication which only relaxes the muscles surrounding the breathing tubes can reduce the effectiveness of medications prescribed to reduce swelling and congestion of the tubes themselves.  Although you feel brief relief from the bronchodilator inhaler, your asthma may actually be worsening with the tubes becoming more swollen and filled with mucus.  This can delay other crucial treatments, such as oral steroid medications. If you need to use a bronchodilator inhaler daily, several times per day, you should discuss this with your provider.  There are probably better treatments that could be used to keep your asthma  under control.     HOME CARE Only take medications as instructed by your medical team. Complete the entire course of an antibiotic. Drink plenty of fluids and get plenty of rest. Avoid close contacts especially the very young and the elderly Cover your mouth if you cough or cough into your sleeve. Always remember to wash your hands A steam or ultrasonic humidifier can help congestion.   GET HELP RIGHT AWAY IF: You develop worsening fever. You become short of breath You cough up blood. Your symptoms persist after you have completed your treatment plan MAKE SURE YOU  Understand these instructions. Will watch your condition. Will get help right away if you are not doing well or get worse.    Thank you for choosing an e-visit.  Your e-visit answers were reviewed by a board certified advanced clinical practitioner to complete your personal care plan. Depending upon the condition, your plan could have included both over the counter or prescription medications.  Please review your pharmacy choice. Make sure the pharmacy is open so you can pick up prescription now. If there is a problem, you may contact your provider through [employer] and have the prescription routed to another pharmacy.  Your safety is important to us . If you have drug allergies check your prescription carefully.   For the next 24 hours you can use MyChart to ask questions about today's visit, request a non-urgent call back, or ask for a work or school excuse. You will get an email in the next two days asking about your experience. I hope that your  e-visit has been valuable and will speed your recovery.  I have spent 5 minutes in review of e-visit questionnaire, review and updating patient chart, medical decision making and response to patient.   Roosvelt Mater, PA-C

## 2023-02-04 NOTE — Progress Notes (Signed)
 Patient needs medication sent to different pharmacy

## 2023-02-06 DIAGNOSIS — M2540 Effusion, unspecified joint: Secondary | ICD-10-CM | POA: Diagnosis not present

## 2023-02-06 DIAGNOSIS — Z79899 Other long term (current) drug therapy: Secondary | ICD-10-CM | POA: Diagnosis not present

## 2023-02-06 DIAGNOSIS — M2560 Stiffness of unspecified joint, not elsewhere classified: Secondary | ICD-10-CM | POA: Diagnosis not present

## 2023-02-06 DIAGNOSIS — M459 Ankylosing spondylitis of unspecified sites in spine: Secondary | ICD-10-CM | POA: Diagnosis not present

## 2023-02-06 DIAGNOSIS — Z6841 Body Mass Index (BMI) 40.0 and over, adult: Secondary | ICD-10-CM | POA: Diagnosis not present

## 2023-02-07 ENCOUNTER — Other Ambulatory Visit: Payer: Self-pay | Admitting: Physician Assistant

## 2023-02-10 DIAGNOSIS — G4733 Obstructive sleep apnea (adult) (pediatric): Secondary | ICD-10-CM | POA: Diagnosis not present

## 2023-02-14 DIAGNOSIS — F3175 Bipolar disorder, in partial remission, most recent episode depressed: Secondary | ICD-10-CM | POA: Diagnosis not present

## 2023-03-13 DIAGNOSIS — G4733 Obstructive sleep apnea (adult) (pediatric): Secondary | ICD-10-CM | POA: Diagnosis not present

## 2023-03-14 DIAGNOSIS — G4733 Obstructive sleep apnea (adult) (pediatric): Secondary | ICD-10-CM | POA: Diagnosis not present

## 2023-03-16 DIAGNOSIS — R5383 Other fatigue: Secondary | ICD-10-CM | POA: Diagnosis not present

## 2023-03-16 DIAGNOSIS — M459 Ankylosing spondylitis of unspecified sites in spine: Secondary | ICD-10-CM | POA: Diagnosis not present

## 2023-03-16 DIAGNOSIS — Z79899 Other long term (current) drug therapy: Secondary | ICD-10-CM | POA: Diagnosis not present

## 2023-03-16 DIAGNOSIS — Z111 Encounter for screening for respiratory tuberculosis: Secondary | ICD-10-CM | POA: Diagnosis not present

## 2023-04-07 DIAGNOSIS — G4733 Obstructive sleep apnea (adult) (pediatric): Secondary | ICD-10-CM | POA: Diagnosis not present

## 2023-04-10 DIAGNOSIS — G4733 Obstructive sleep apnea (adult) (pediatric): Secondary | ICD-10-CM | POA: Diagnosis not present

## 2023-04-13 ENCOUNTER — Telehealth: Payer: Self-pay

## 2023-04-13 NOTE — Progress Notes (Addendum)
   04/13/2023  Patient ID: Allison Atkinson, female   DOB: 1975-09-16, 48 y.o.   MRN: [id number]   Patient appeared on insurance report for not passing the quality metrics in 2024:  Medication Adherence for Cholesterol (MAC)   Outreach to the patient was Unsuccessful. Attempted to call on 04/13/23, left a voicemail to return call.  Meds tracking:  -Atorvastatin 10 mg - Last filled 30DS on 03/27/23, first fill 30DS on 02/10/23 so patient falls into metric this year. LDL at 108 on 11/03/22. Next fill due 04/26/23.  -Metformin ER 500 mg - Last filled 30DS on 03/27/23, first fill 30DS on 02/10/23, patient is in metric for the year. Last A1C 6.9 on 11/03/22, followed by endo. Next fill due 04/26/23.  -Ozempic 1 mg - Last filled 28 DS on 03/22/23, first fill 28Ds on 01/27/23, patient is in metric for the year. Next fill due 04/19/23.  Plan:  No upcoming visits scheduled with PCP, last visit cancelled. Will review fill history on 04/27/23 and outreach if needed. Coordination with endocrinology will likely be necessary for diabetes meds, would like to help manage lipids if patient and PCP are willing since she is not at goal.   Fayette Pho, PharmD

## 2023-04-27 ENCOUNTER — Other Ambulatory Visit (HOSPITAL_COMMUNITY): Payer: Self-pay

## 2023-04-27 ENCOUNTER — Other Ambulatory Visit: Payer: Self-pay

## 2023-04-27 ENCOUNTER — Telehealth: Payer: Self-pay

## 2023-04-27 MED ORDER — OZEMPIC (1 MG/DOSE) 4 MG/3ML ~~LOC~~ SOPN
1.0000 mg | PEN_INJECTOR | SUBCUTANEOUS | 1 refills | Status: DC
  Filled 2023-04-27: qty 3, 28d supply, fill #0

## 2023-04-27 NOTE — Progress Notes (Signed)
   04/27/2023  Patient ID: Mariel Aloe, female   DOB: June 28, 1975, 48 y.o.   MRN: [id number]   Patient appeared on insurance report for not passing the quality metrics in 2024:  Medication Adherence for Cholesterol (MAC)   Outreach to the patient was Unsuccessful. Attempted to call on 04/27/23 left a voicemail to return call.  Meds tracking:  -Atorvastatin 10 mg - Last filled 30DS on 03/27/23, first fill 30DS on 02/10/23 so patient falls into metric this year. LDL at 108 on 11/03/22. Next fill due 04/26/23.  -Metformin ER 500 mg - Last filled 30DS on 03/27/23, first fill 30DS on 02/10/23, patient is in metric for the year. Last A1C 6.9 on 11/03/22, followed by endo. Next fill due 04/26/23.  -Ozempic 1 mg - Last filled 28 DS on 03/22/23, first fill 28Ds on 01/27/23, patient is in metric for the year. Next fill due 04/19/23.  Plan:  No upcoming visits scheduled with PCP, last visit cancelled. No additional fills for the above medications. Patient likely overdue at this time. Left voicemail, will attempt to outreach again on 05/02/23 after fill history review if no fills noted.   Fayette Pho, PharmD

## 2023-04-28 ENCOUNTER — Other Ambulatory Visit (HOSPITAL_COMMUNITY): Payer: Self-pay

## 2023-05-02 ENCOUNTER — Telehealth: Payer: Self-pay

## 2023-05-02 NOTE — Progress Notes (Signed)
   05/02/2023  Patient ID: Allison Atkinson, female   DOB: [birth year], 48 y.o.   MRN: [id number]   Patient appeared on insurance report for not passing the quality metrics in 2024:  Medication Adherence for Cholesterol (MAC)   Outreach to the patient was not needed today. She filled all meds on 05/01/23.  Meds tracking:  -Atorvastatin 10 mg - Last filled 90DS on 05/01/23, patient qualifies for metric this year. LDL at 108 on 11/03/22. Next fill due 07/30/23  -Metformin ER 500 mg - Last filled 90DS on 05/01/23, patient qualifies for metric for the year. Last A1C 6.9 on 11/03/22, followed by endo. Next fill due 07/30/23  -Ozempic 1 mg - Last filled 28 DS on 05/01/23, patient qualifies for metric. Next fill due 05/29/23.  Plan:  No upcoming visits scheduled with PCP, notified office staff sh/e is due to reschedule. Will continue to follow adherence to above meds, next fill history review scheduled 06/01/23   Fayette Pho, PharmD

## 2023-05-03 ENCOUNTER — Other Ambulatory Visit (HOSPITAL_COMMUNITY): Payer: Self-pay

## 2023-05-08 DIAGNOSIS — R5383 Other fatigue: Secondary | ICD-10-CM | POA: Diagnosis not present

## 2023-05-08 DIAGNOSIS — Z79899 Other long term (current) drug therapy: Secondary | ICD-10-CM | POA: Diagnosis not present

## 2023-05-08 DIAGNOSIS — M2540 Effusion, unspecified joint: Secondary | ICD-10-CM | POA: Diagnosis not present

## 2023-05-08 DIAGNOSIS — G4733 Obstructive sleep apnea (adult) (pediatric): Secondary | ICD-10-CM | POA: Diagnosis not present

## 2023-05-08 DIAGNOSIS — Z6841 Body Mass Index (BMI) 40.0 and over, adult: Secondary | ICD-10-CM | POA: Diagnosis not present

## 2023-05-08 DIAGNOSIS — M2560 Stiffness of unspecified joint, not elsewhere classified: Secondary | ICD-10-CM | POA: Diagnosis not present

## 2023-05-08 DIAGNOSIS — M459 Ankylosing spondylitis of unspecified sites in spine: Secondary | ICD-10-CM | POA: Diagnosis not present

## 2023-05-11 DIAGNOSIS — G4733 Obstructive sleep apnea (adult) (pediatric): Secondary | ICD-10-CM | POA: Diagnosis not present

## 2023-05-29 ENCOUNTER — Telehealth: Admitting: Physician Assistant

## 2023-05-29 DIAGNOSIS — J069 Acute upper respiratory infection, unspecified: Secondary | ICD-10-CM | POA: Diagnosis not present

## 2023-05-29 MED ORDER — FLUTICASONE PROPIONATE 50 MCG/ACT NA SUSP: 2.0000 | Freq: Every day | NASAL | 0 refills | Status: DC

## 2023-05-29 MED ORDER — BENZONATATE 100 MG PO CAPS: 100.0000 mg | ORAL_CAPSULE | Freq: Three times a day (TID) | ORAL | 0 refills | Status: DC | PRN

## 2023-05-29 NOTE — Progress Notes (Signed)

## 2023-05-31 ENCOUNTER — Telehealth: Admitting: Physician Assistant

## 2023-05-31 DIAGNOSIS — J208 Acute bronchitis due to other specified organisms: Secondary | ICD-10-CM

## 2023-05-31 MED ORDER — ALBUTEROL SULFATE HFA 108 (90 BASE) MCG/ACT IN AERS: 1.0000 | INHALATION_SPRAY | Freq: Four times a day (QID) | RESPIRATORY_TRACT | 0 refills | Status: DC | PRN

## 2023-05-31 MED ORDER — PSEUDOEPH-BROMPHEN-DM 30-2-10 MG/5ML PO SYRP: 5.0000 mL | ORAL_SOLUTION | Freq: Four times a day (QID) | ORAL | 0 refills | Status: DC | PRN

## 2023-05-31 NOTE — Progress Notes (Signed)
 E-Visit for Cough   We are sorry that you are not feeling well.  Here is how we plan to help!  Based on your presentation I believe you most likely have A cough due to a virus.  This is called viral bronchitis and is best treated by rest, plenty of fluids and control of the cough.  You may use Ibuprofen or Tylenol  as directed to help your symptoms.     In addition you may use a prescription cough syrup called Bromfed DM cough syrup Take 5mL every 6 hours as needed for cough  I have also prescribed: Albuterol inhaler Use 1-2 puffs every 6 hours as needed for shortness of breath, chest tightness, and/or wheezing.  From your responses in the eVisit questionnaire you describe inflammation in the upper respiratory tract which is causing a significant cough.  This is commonly called Bronchitis and has four common causes:   Allergies Viral Infections Acid Reflux Bacterial Infection Allergies, viruses and acid reflux are treated by controlling symptoms or eliminating the cause. An example might be a cough caused by taking certain blood pressure medications. You stop the cough by changing the medication. Another example might be a cough caused by acid reflux. Controlling the reflux helps control the cough.  USE OF BRONCHODILATOR ("RESCUE") INHALERS: There is a risk from using your bronchodilator too frequently.  The risk is that over-reliance on a medication which only relaxes the muscles surrounding the breathing tubes can reduce the effectiveness of medications prescribed to reduce swelling and congestion of the tubes themselves.  Although you feel brief relief from the bronchodilator inhaler, your asthma may actually be worsening with the tubes becoming more swollen and filled with mucus.  This can delay other crucial treatments, such as oral steroid medications. If you need to use a bronchodilator inhaler daily, several times per day, you should discuss this with your provider.  There are probably  better treatments that could be used to keep your asthma under control.     HOME CARE Only take medications as instructed by your medical team. Complete the entire course of an antibiotic. Drink plenty of fluids and get plenty of rest. Avoid close contacts especially the very young and the elderly Cover your mouth if you cough or cough into your sleeve. Always remember to wash your hands A steam or ultrasonic humidifier can help congestion.   GET HELP RIGHT AWAY IF: You develop worsening fever. You become short of breath You cough up blood. Your symptoms persist after you have completed your treatment plan MAKE SURE YOU  Understand these instructions. Will watch your condition. Will get help right away if you are not doing well or get worse.    Thank you for choosing an e-visit.  Your e-visit answers were reviewed by a board certified advanced clinical practitioner to complete your personal care plan. Depending upon the condition, your plan could have included both over the counter or prescription medications.  Please review your pharmacy choice. Make sure the pharmacy is open so you can pick up prescription now. If there is a problem, you may contact your provider through [employer] and have the prescription routed to another pharmacy.  Your safety is important to us . If you have drug allergies check your prescription carefully.   For the next 24 hours you can use MyChart to ask questions about today's visit, request a non-urgent call back, or ask for a work or school excuse. You will get an email in the next  two days asking about your experience. I hope that your e-visit has been valuable and will speed your recovery.   I have spent 5 minutes in review of e-visit questionnaire, review and updating patient chart, medical decision making and response to patient.   Angelia Kelp, PA-C

## 2023-06-06 ENCOUNTER — Telehealth: Admitting: Physician Assistant

## 2023-06-06 ENCOUNTER — Telehealth: Payer: Self-pay

## 2023-06-06 DIAGNOSIS — R0602 Shortness of breath: Secondary | ICD-10-CM

## 2023-06-06 NOTE — Progress Notes (Signed)
   06/06/2023  Patient ID: Allison Atkinson, female   DOB: August 16, 1975, 48 y.o.   MRN: [id number]  Medication Adherence:  Documentation in [employer].   Flint Hummer, PharmD

## 2023-06-06 NOTE — Progress Notes (Signed)
  Because of ongoing symptoms despite prior treatments and giving shortness of breath, I feel your condition warrants further evaluation and I recommend that you be seen in a face-to-face visit.   NOTE: There will be NO CHARGE for this E-Visit   If you are having a true medical emergency, please call 911.     For an urgent face to face visit, [facility] has multiple urgent care centers for your convenience.  Click the link below for the full list of locations and hours, walk-in wait times, appointment scheduling options and driving directions:  Urgent Care - [place], [place], [place], [place], [place], [place]  [place]     Your MyChart E-visit questionnaire answers were reviewed by a board certified advanced clinical practitioner to complete your personal care plan based on your specific symptoms.    Thank you for using e-Visits.

## 2023-06-08 DIAGNOSIS — J209 Acute bronchitis, unspecified: Secondary | ICD-10-CM | POA: Diagnosis not present

## 2023-06-10 DIAGNOSIS — G4733 Obstructive sleep apnea (adult) (pediatric): Secondary | ICD-10-CM | POA: Diagnosis not present

## 2023-07-11 DIAGNOSIS — G4733 Obstructive sleep apnea (adult) (pediatric): Secondary | ICD-10-CM | POA: Diagnosis not present

## 2023-08-01 DIAGNOSIS — Z6841 Body Mass Index (BMI) 40.0 and over, adult: Secondary | ICD-10-CM | POA: Diagnosis not present

## 2023-08-01 DIAGNOSIS — E1165 Type 2 diabetes mellitus with hyperglycemia: Secondary | ICD-10-CM | POA: Diagnosis not present

## 2023-08-01 DIAGNOSIS — E039 Hypothyroidism, unspecified: Secondary | ICD-10-CM | POA: Diagnosis not present

## 2023-08-01 NOTE — Progress Notes (Signed)
 Chief Complaint: Chief Complaint  Patient presents with  . Follow-up    HPI: Ms.  Allison Atkinson, 48 y.o. female, coming in for follow up of type 2 DM and hypothyroidism.    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 Heaviest weight 325 lbs.    Currently taking metformin 500 mg daily for PCOS and Type 2 DM.    Ozempic  started in November 2022.  She reports tolerating well.  Currently taking Ozempic  1 mg weekly.   A1c 6.9% 11/03/22  A1c 7.3% 08/01/23   Does not check BG   Previously on pravastatin but was causing headaches.   Hypothyroidism: reports being diagnosed around the age of 31. She reports history of few miscarriages. She does not have any children. Currently taking Synthroid  200 mcg 6 days of the week and half tablet 1 day of the week.    DM current regimen and BS control: Current meds-Ozempic  1 mg weekly, metformin 500 mg daily Glucometer-not using Frequency of BS monitoring-not checking blood glucose BS range-no blood glucose trends for review today Adherence to regimen-good H/o Hypoglycemia-denies H/o hypoglycemic unawareness-denies   Diabetes history: Type- 2 Duration- 02/2017 How diagnosis was made-elevated blood glucose Previous treatment regimens-metformin  Previous control-6.1% 02/2020--> 5.8% 11/2020--> 6.2% 07/2021--> 6.9% 10/24--> 7.3% 07/2023  H/o hospitalizations for hyper/hypoglycemia-denies Last CDE/ RD visit-   Complications: Kidney-denies history of chronic kidney disease  BP- BP Readings from Last 3 Encounters:  08/01/23 (!) 123/59  11/08/22 138/87  08/16/21 128/77   Eye-denies retinopathy  Neuropathy- denies Cardiac-denies  Other-  Lifestyle:  Diet: trying to monitor carb intake Exercise: walking  Social:  Medications: New Medications Ordered This Visit  Medications  . tirzepatide (Mounjaro) 5 mg/0.5 mL subcutaneous pen injector    Sig: Inject 5 mg under the skin  every 7 days.    Dispense:  2 mL    Refill:  3    Cancel Ozmepic. thanks  . metFORMIN (GLUCOPHAGE-XR) 500 mg 24 hr tablet    Sig: Take 1 tablet (500 mg total) by mouth daily with breakfast.    Dispense:  90 tablet    Refill:  3  . OneTouch Verio test strips test strip    Sig: Use as instructed to check BG daily    Dispense:  100 strip    Refill:  5    (Dx: E11.9) please dispense test strips and glucometer covered by insurance  . blood-glucose meter (OneTouch Verio Flex meter) misc    Sig: 1 each by miscellaneous route once for 1 dose.    Dispense:  1 each    Refill:  0      Allergies: Allergies[1]  PMH: Medical History[2]  Family history: Family History[3]  Social history: Social History   Socioeconomic History  . Marital status: Married    Spouse name: Not on file  . Number of children: Not on file  . Years of education: Not on file  . Highest education level: Not on file  Occupational History  . Not on file  Tobacco Use  . Smoking status: Never  . Smokeless tobacco: Never  Substance and Sexual Activity  . Alcohol use: No  . Drug use: No  . Sexual activity: Not on file  Other Topics Concern  . Not on file  Social History Narrative   ** Merged History Encounter **       Caffeine:  Little.  Exercise: Little, too tired.  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   Marital Status: Married.      Social Drivers of Health   Food Insecurity: Low Risk  (08/01/2023)   Food vital sign   . Within the past 12 months, you worried that your food would run out before you got money to buy more: Never true   . Within the past 12 months, the food you bought just didn't last and you didn't have money to get more: Never true  Transportation Needs: No Transportation Needs (08/01/2023)   Transportation   . In the past 12 months, has lack of reliable transportation kept you from medical appointments, meetings, work or from  getting things needed for daily living? : No  Safety: Low Risk  (08/01/2023)   Safety   . How often does anyone, including family and friends, physically hurt you?: Never   . How often does anyone, including family and friends, insult or talk down to you?: Never   . How often does anyone, including family and friends, threaten you with harm?: Never   . How often does anyone, including family and friends, scream or curse at you?: Never  Living Situation: Low Risk  (08/01/2023)   Living Situation   . What is your living situation today?: I have a steady place to live   . Think about the place you live. Do you have problems with any of the following? Choose all that apply:: Not on file    Review of Systems: As above  Physical Exam: Vitals:   08/01/23 1038  BP: (!) 123/59  Pulse: 80  SpO2: 100%   Weights (last 90 days)     Date/Time Weight   08/01/23 1038 123 kg (272 lb 1.6 oz)       Gen: Pleasant, well-appearing female, NAD HEENT: PERRL, EOM normal, no lid lag, no stare Oropharynx: Clear, moist, no oropharyngeal exudate Neck: no visible goiter  CVS: HR 80 RS: unlabored breathing pattern  Neuro: No tremor Skin: Warm and dry Extremities: No edema Psych: Mood and affect normal  Foot exam: Full sensation to bilateral feet on monofilament exam. No wounds or sores noted   Data: Lab Results  Component Value Date   HGBA1C 6.9 (H) 11/03/2022   Lab Results  Component Value Date   CHOL 199 11/03/2022   TRIG 139 11/03/2022   HDL 66 11/03/2022   LDLCALC 108 (H) 11/03/2022   Lab Results  Component Value Date   CREATININE 0.66 11/03/2022   BUN 5 (L) 11/03/2022   NA 139 11/03/2022   K 3.8 11/03/2022   CL 99 11/03/2022   CO2 31 11/03/2022   Lab Results  Component Value Date   TSH 0.965 11/03/2022   Lab Results  Component Value Date   WBC 9.6 03/19/2020   HGB 13.6 03/19/2020   HCT 41.1 03/19/2020   MCV 91.4 03/19/2020   PLT 358 03/19/2020   Lab Results  Component  Value Date   ALBUR <7 11/03/2022    Immunization History  Administered Date(s) Administered  . Influenza, Injectable, Quadrivalent, Preservative Free 10/19/2017  . Influenza, Unspecified 11/13/2012, 11/24/2013, 10/19/2017  . Moderna SARS-CoV-2 Primary Series 12+ yrs 04/25/2019, 05/23/2019, 12/12/2019  . Pneumococcal Polysaccharide Vaccine, 23 Valent (PNEUMOVAX-23) 2Y+ 10/24/2008  . Pneumococcal, Unspecified 10/24/2008  . TD vaccine (ADULT)PF(TENIVAC) 7Y+ 06/28/2018    Assessment:  In summary, 48 y.o. female with  has a past medical history of Bipolar disorder    (CMD),  Chronic anxiety, Depression, Gastroesophageal reflux, Headache, Hypothyroidism, IFG (impaired fasting glucose), Infertility, female, Inflammatory arthritis, Insomnia, Mixed dyslipidemia, Palpitations, Palpitations, Panic attacks, PCOS (polycystic ovarian syndrome), and Vitamin D deficiency.here for follow up  Type 2 DM: A1c 7.3% today. Goal A1c 7% or less with hypoglycemia avoidance. Prior A1c 6.9%/ Will try Mounjaro in place of Ozempic  as this may offer additional weight loss and improvement glycemic control.  Reviewed importance of monitoring carb intake and exercising as tolerated.   PCOS: continue metformin 500 mg daily    Hypothyroidism: Thyroid  labs today.  Will adjust Synthroid  dose if needed.   Obesity/BMI 48: Will try Mounjaro in place of Ozempic .  Encouraged her to monitor carb intake and exercise.  Plan: Diabetes Mellitus -  A1c target <7% with hypoglycemia avoidance Thyroid  labs today   Continue Synthroid  200 mcg tablet. 1 tablet 6 days of the week and half 1 day of the week  Complete Ozempic  then start Mounjaro 5 mg ONCE weekly. Message me around week 3 if need to increase dose to 7.5 mg weekly.   Continue metformin  Monitor BG   Monitor carb intake   DM-related therapies  ASA: not currently taking  ACE/ARB: not currently taking Statin: caused HA Dilated eye exam: UTD per patient  Foot exam:  08/01/23 Flu Shot: annually  Albuminuria Screening: 11/03/22 HTN: Most recent BP is at goal <140/90 per ADA guidelines.  Follow-up: 6 months   Electronically signed by: Warren Gleen Batty, FNP 08/01/2023 11:02 AM         [1] Allergies Allergen Reactions  . Risperidone Other (See Comments) and Anaphylaxis    unknown  . Oxycodone  GI Intolerance  . Risperidone Other (See Comments)    Unknown  . Latex Rash    Raised red rash at areas of contact  [2] Past Medical History: Diagnosis Date  . Bipolar disorder    (CMD)   . Chronic anxiety   . Depression   . Gastroesophageal reflux   . Headache   . Hypothyroidism   . IFG (impaired fasting glucose)    (prediabetes), on metformin.  . Infertility, female   . Inflammatory arthritis   . Insomnia   . Mixed dyslipidemia   . Palpitations    (evaluated by [facility] cardiology in 2014-2015).  . Palpitations   . Panic attacks   . PCOS (polycystic ovarian syndrome)   . Vitamin D deficiency   [3] Family History Problem Relation Name Age of Onset  . Hyperlipidemia Mother    . Schizophrenia Mother    . Hypertension Mother    . Hyperlipidemia Father    . Alcohol abuse Father    . Acromegaly Maternal Grandmother    . Hypothyroidism Maternal Grandmother    . Thyroid  disease Maternal Grandmother    . Arthritis Maternal Grandmother    . Diabetes Neg Hx    . Gout Neg Hx    . Heart disease Neg Hx    . Hypothyroidism Other Grandmother   . Acromegaly Other Grandmother

## 2023-08-03 ENCOUNTER — Telehealth: Payer: Self-pay

## 2023-08-03 NOTE — Telephone Encounter (Signed)
 Unsuccessful outreach, overdue on atorvastatin, will try again next week

## 2023-08-07 DIAGNOSIS — M2540 Effusion, unspecified joint: Secondary | ICD-10-CM | POA: Diagnosis not present

## 2023-08-07 DIAGNOSIS — Z79899 Other long term (current) drug therapy: Secondary | ICD-10-CM | POA: Diagnosis not present

## 2023-08-07 DIAGNOSIS — R5383 Other fatigue: Secondary | ICD-10-CM | POA: Diagnosis not present

## 2023-08-07 DIAGNOSIS — M2560 Stiffness of unspecified joint, not elsewhere classified: Secondary | ICD-10-CM | POA: Diagnosis not present

## 2023-08-07 DIAGNOSIS — Z6841 Body Mass Index (BMI) 40.0 and over, adult: Secondary | ICD-10-CM | POA: Diagnosis not present

## 2023-08-07 DIAGNOSIS — M459 Ankylosing spondylitis of unspecified sites in spine: Secondary | ICD-10-CM | POA: Diagnosis not present

## 2023-08-08 ENCOUNTER — Telehealth: Payer: Self-pay

## 2023-08-08 NOTE — Telephone Encounter (Signed)
 Patient discontinued atorvastatin due to headaches, not willing to try another statin today. Assisted in scheduled AWV with PCP, will discuss alternative treatment at this visit

## 2023-08-10 DIAGNOSIS — G4733 Obstructive sleep apnea (adult) (pediatric): Secondary | ICD-10-CM | POA: Diagnosis not present

## 2023-08-15 DIAGNOSIS — E1169 Type 2 diabetes mellitus with other specified complication: Secondary | ICD-10-CM | POA: Diagnosis not present

## 2023-08-15 DIAGNOSIS — E785 Hyperlipidemia, unspecified: Secondary | ICD-10-CM | POA: Diagnosis not present

## 2023-08-15 DIAGNOSIS — Z79899 Other long term (current) drug therapy: Secondary | ICD-10-CM | POA: Diagnosis not present

## 2023-08-15 DIAGNOSIS — F3175 Bipolar disorder, in partial remission, most recent episode depressed: Secondary | ICD-10-CM | POA: Diagnosis not present

## 2023-08-15 DIAGNOSIS — E039 Hypothyroidism, unspecified: Secondary | ICD-10-CM | POA: Diagnosis not present

## 2023-08-22 DIAGNOSIS — E785 Hyperlipidemia, unspecified: Secondary | ICD-10-CM | POA: Diagnosis not present

## 2023-08-22 DIAGNOSIS — E1169 Type 2 diabetes mellitus with other specified complication: Secondary | ICD-10-CM | POA: Diagnosis not present

## 2023-08-22 DIAGNOSIS — M459 Ankylosing spondylitis of unspecified sites in spine: Secondary | ICD-10-CM | POA: Diagnosis not present

## 2023-08-22 DIAGNOSIS — K760 Fatty (change of) liver, not elsewhere classified: Secondary | ICD-10-CM | POA: Diagnosis not present

## 2023-09-10 DIAGNOSIS — G4733 Obstructive sleep apnea (adult) (pediatric): Secondary | ICD-10-CM | POA: Diagnosis not present

## 2023-10-09 DIAGNOSIS — M459 Ankylosing spondylitis of unspecified sites in spine: Secondary | ICD-10-CM | POA: Diagnosis not present

## 2023-10-11 DIAGNOSIS — G4733 Obstructive sleep apnea (adult) (pediatric): Secondary | ICD-10-CM | POA: Diagnosis not present

## 2023-11-01 ENCOUNTER — Encounter: Payer: Self-pay | Admitting: *Deleted

## 2023-11-01 NOTE — Progress Notes (Signed)
 NOHEALANI MEDINGER                                          MRN: [id number]   11/01/2023   The [facility] Quality Team Specialist reviewed this patient medical record for the purposes of chart review for care gap closure. The following were reviewed: chart review for care gap closure-kidney health evaluation for diabetes:eGFR  and uACR.    [facility] Quality Team

## 2023-11-06 ENCOUNTER — Telehealth: Admitting: Physician Assistant

## 2023-11-06 DIAGNOSIS — M273 Alveolitis of jaws: Secondary | ICD-10-CM

## 2023-11-06 MED ORDER — NAPROXEN 500 MG PO TABS: 500.0000 mg | ORAL_TABLET | Freq: Two times a day (BID) | ORAL | 0 refills | Status: AC

## 2023-11-06 NOTE — Progress Notes (Signed)
 E-Visit for Dental Pain  We are sorry that you are not feeling well.  Here is how we plan to help!  Based on what you have shared with me in the questionnaire, it sounds like you have dental pain following and extraction and potential dry socket.  You need to keep hydrated. Avoid drinking through straws. Continue care as directed by your dental provider. I have sent in Naprosyn 500mg  2 times a day for 7 days for discomfort for use tonight along with OTC Tylenol . Contact your dental provider in the AM for further management and follow-up.  It is imperative that you see a dentist within 10 days of this eVisit to determine the cause of the dental pain and be sure it is adequately treated  A toothache or tooth pain is caused when the nerve in the root of a tooth or surrounding a tooth is irritated. Dental (tooth) infection, decay, injury, or loss of a tooth are the most common causes of dental pain. Pain may also occur after an extraction (tooth is pulled out). Pain sometimes originates from other areas and radiates to the jaw, thus appearing to be tooth pain.Bacteria growing inside your mouth can contribute to gum disease and dental decay, both of which can cause pain. A toothache occurs from inflammation of the central portion of the tooth called pulp. The pulp contains nerve endings that are very sensitive to pain. Inflammation to the pulp or pulpitis may be caused by dental cavities, trauma, and infection.    HOME CARE:   For toothaches: Over-the-counter pain medications such as acetaminophen  or ibuprofen may be used. Take these as directed on the package while you arrange for a dental appointment. Avoid very cold or hot foods, because they may make the pain worse. You may get relief from biting on a cotton ball soaked in oil of cloves. You can get oil of cloves at most drug stores.  For jaw pain:  Aspirin may be helpful for problems in the joint of the jaw in adults. If pain happens every  time you open your mouth widely, the temporomandibular joint (TMJ) may be the source of the pain. Yawning or taking a large bite of food may worsen the pain. An appointment with your doctor or dentist will help you find the cause.     GET HELP RIGHT AWAY IF:  You have a high fever or chills If you have had a recent head or face injury and develop headache, light headedness, nausea, vomiting, or other symptoms that concern you after an injury to your face or mouth, you could have a more serious injury in addition to your dental injury. A facial rash associated with a toothache: This condition may improve with medication. Contact your doctor for them to decide what is appropriate. Any jaw pain occurring with chest pain: Although jaw pain is most commonly caused by dental disease, it is sometimes referred pain from other areas. People with heart disease, especially people who have had stents placed, people with diabetes, or those who have had heart surgery may have jaw pain as a symptom of heart attack or angina. If your jaw or tooth pain is associated with lightheadedness, sweating, or shortness of breath, you should see a doctor as soon as possible. Trouble swallowing or excessive pain or bleeding from gums: If you have a history of a weakened immune system, diabetes, or steroid use, you may be more susceptible to infections. Infections can often be more severe and extensive or  caused by unusual organisms. Dental and gum infections in people with these conditions may require more aggressive treatment. An abscess may need draining or IV antibiotics, for example.  MAKE SURE YOU   Understand these instructions. Will watch your condition. Will get help right away if you are not doing well or get worse.  Thank you for choosing an e-visit.  Your e-visit answers were reviewed by a board certified advanced clinical practitioner to complete your personal care plan. Depending upon the condition, your plan  could have included both over the counter or prescription medications.  Please review your pharmacy choice. Make sure the pharmacy is open so you can pick up prescription now. If there is a problem, you may contact your provider through [employer] and have the prescription routed to another pharmacy.  Your safety is important to us . If you have drug allergies check your prescription carefully.   For the next 24 hours you can use MyChart to ask questions about today's visit, request a non-urgent call back, or ask for a work or school excuse. You will get an email in the next two days asking about your experience. I hope that your e-visit has been valuable and will speed your recovery.  I have spent 5 minutes in review of e-visit questionnaire, review and updating patient chart, medical decision making and response to patient.   Elsie Velma Lunger, PA-C

## 2023-11-07 MED ORDER — AMOXICILLIN-POT CLAVULANATE 875-125 MG PO TABS: 1.0000 | ORAL_TABLET | Freq: Two times a day (BID) | ORAL | 0 refills | Status: AC

## 2023-11-07 NOTE — Addendum Note (Signed)
 Addended by: GLADIS ELSIE BROCKS on: 11/07/2023 09:24 AM   Modules accepted: Orders

## 2023-11-28 DIAGNOSIS — Z6841 Body Mass Index (BMI) 40.0 and over, adult: Secondary | ICD-10-CM | POA: Diagnosis not present

## 2023-11-28 DIAGNOSIS — E559 Vitamin D deficiency, unspecified: Secondary | ICD-10-CM | POA: Diagnosis not present

## 2023-11-28 DIAGNOSIS — M459 Ankylosing spondylitis of unspecified sites in spine: Secondary | ICD-10-CM | POA: Diagnosis not present

## 2023-11-28 DIAGNOSIS — Z79899 Other long term (current) drug therapy: Secondary | ICD-10-CM | POA: Diagnosis not present

## 2023-11-28 DIAGNOSIS — M2540 Effusion, unspecified joint: Secondary | ICD-10-CM | POA: Diagnosis not present

## 2023-11-28 DIAGNOSIS — M2560 Stiffness of unspecified joint, not elsewhere classified: Secondary | ICD-10-CM | POA: Diagnosis not present

## 2023-11-28 DIAGNOSIS — M79645 Pain in left finger(s): Secondary | ICD-10-CM | POA: Diagnosis not present

## 2023-11-29 ENCOUNTER — Other Ambulatory Visit (HOSPITAL_COMMUNITY): Payer: Self-pay

## 2023-11-29 ENCOUNTER — Other Ambulatory Visit: Payer: Self-pay

## 2023-11-29 MED ORDER — TIRZEPATIDE 7.5 MG/0.5ML ~~LOC~~ SOAJ
7.5000 mg | SUBCUTANEOUS | 6 refills | Status: AC
  Filled 2023-11-29: qty 2, 28d supply, fill #0
  Filled 2023-12-22: qty 2, 28d supply, fill #1
  Filled 2024-01-19: qty 2, 28d supply, fill #2
  Filled 2024-02-16: qty 2, 28d supply, fill #3

## 2023-12-18 DIAGNOSIS — E785 Hyperlipidemia, unspecified: Secondary | ICD-10-CM | POA: Diagnosis not present

## 2023-12-18 DIAGNOSIS — Z79899 Other long term (current) drug therapy: Secondary | ICD-10-CM | POA: Diagnosis not present

## 2023-12-18 DIAGNOSIS — E1169 Type 2 diabetes mellitus with other specified complication: Secondary | ICD-10-CM | POA: Diagnosis not present

## 2023-12-18 DIAGNOSIS — E039 Hypothyroidism, unspecified: Secondary | ICD-10-CM | POA: Diagnosis not present

## 2024-02-02 ENCOUNTER — Encounter (HOSPITAL_COMMUNITY): Payer: Self-pay | Admitting: *Deleted

## 2024-03-18 ENCOUNTER — Encounter (HOSPITAL_COMMUNITY)

## 2024-03-18 DIAGNOSIS — Z1231 Encounter for screening mammogram for malignant neoplasm of breast: Secondary | ICD-10-CM
